# Patient Record
Sex: Male | Born: 1985
Health system: Southern US, Community
[De-identification: ages and names within clinical notes are randomized; demographics above are authoritative.]

## PROBLEM LIST (undated history)

## (undated) DIAGNOSIS — M5137 Other intervertebral disc degeneration, lumbosacral region: Secondary | ICD-10-CM

## (undated) DIAGNOSIS — M25569 Pain in unspecified knee: Secondary | ICD-10-CM

## (undated) DIAGNOSIS — M79606 Pain in leg, unspecified: Secondary | ICD-10-CM

## (undated) DIAGNOSIS — M7918 Myalgia, other site: Secondary | ICD-10-CM

## (undated) DIAGNOSIS — M51379 Other intervertebral disc degeneration, lumbosacral region without mention of lumbar back pain or lower extremity pain: Secondary | ICD-10-CM

## (undated) DIAGNOSIS — R7303 Prediabetes: Secondary | ICD-10-CM

## (undated) DIAGNOSIS — G4730 Sleep apnea, unspecified: Secondary | ICD-10-CM

## (undated) DIAGNOSIS — M25559 Pain in unspecified hip: Secondary | ICD-10-CM

## (undated) DIAGNOSIS — E785 Hyperlipidemia, unspecified: Secondary | ICD-10-CM

## (undated) DIAGNOSIS — M4300 Spondylolysis, site unspecified: Secondary | ICD-10-CM

## (undated) DIAGNOSIS — K219 Gastro-esophageal reflux disease without esophagitis: Secondary | ICD-10-CM

## (undated) DIAGNOSIS — M25579 Pain in unspecified ankle and joints of unspecified foot: Secondary | ICD-10-CM

## (undated) HISTORY — DX: Prediabetes: R73.03

## (undated) HISTORY — DX: Sleep apnea, unspecified: G47.30

## (undated) HISTORY — DX: Other intervertebral disc degeneration, lumbosacral region without mention of lumbar back pain or lower extremity pain: M51.379

## (undated) HISTORY — DX: Hyperlipidemia, unspecified: E78.5

## (undated) HISTORY — PX: TONSILLECTOMY: SUR1361

## (undated) HISTORY — DX: Gastro-esophageal reflux disease without esophagitis: K21.9

## (undated) HISTORY — DX: Other intervertebral disc degeneration, lumbosacral region: M51.37

---
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
## 2006-08-31 ENCOUNTER — Emergency Department (HOSPITAL_COMMUNITY): Admission: EM | Admit: 2006-08-31 | Discharge: 2006-08-31 | Payer: Self-pay | Admitting: Emergency Medicine

## 2010-12-16 LAB — URINALYSIS, ROUTINE W REFLEX MICROSCOPIC
Glucose, UA: NEGATIVE
Hgb urine dipstick: NEGATIVE
pH: 7

## 2013-12-29 ENCOUNTER — Encounter: Payer: Self-pay | Admitting: Neurology

## 2013-12-29 ENCOUNTER — Ambulatory Visit (INDEPENDENT_AMBULATORY_CARE_PROVIDER_SITE_OTHER): Payer: BC Managed Care – PPO | Admitting: Neurology

## 2013-12-29 VITALS — BP 182/28 | HR 52 | Temp 98.2°F | Ht 68.0 in | Wt 187.0 lb

## 2013-12-29 DIAGNOSIS — G4733 Obstructive sleep apnea (adult) (pediatric): Secondary | ICD-10-CM

## 2013-12-29 DIAGNOSIS — G4761 Periodic limb movement disorder: Secondary | ICD-10-CM

## 2013-12-29 DIAGNOSIS — K219 Gastro-esophageal reflux disease without esophagitis: Secondary | ICD-10-CM

## 2013-12-29 DIAGNOSIS — G4719 Other hypersomnia: Secondary | ICD-10-CM

## 2013-12-29 NOTE — Patient Instructions (Signed)

## 2013-12-29 NOTE — Progress Notes (Signed)
Subjective:    Patient ID: Rick Maldonado is a 28 y.o. male.  HPI    Rick Foley, MD, PhD [facility] [address] [address] [place], [place] [address]  Dear Rick Maldonado,   I saw your patient, Rick Maldonado, upon your kind request in my neurologic clinic today for initial consultation of his sleep disturbance, in particular, concern for underlying obstructive sleep apnea. The patient is unaccompanied today. As you know, Rick Maldonado is a 28 year old right-handed man with an underlying medical history of obesity, and reflux disease, who reports snoring, daytime somnolence and nonrestorative sleep. He has been told that he has pauses in his breathing while asleep.  His typical bedtime is reported to be around 9 to 10 PM and usual wake time is around 6:30 AM. Sleep onset typically occurs within minutes. He reports feeling poorly rested upon awakening. He wakes up on an average 3 times in the middle of the night and has to go to the bathroom 1 times on a typical night. He denies morning headaches.  He reports excessive daytime somnolence (EDS) and His Epworth Sleepiness Score (ESS) is 14/24 today. He has been snoring for years. Snoring is loud. It is associated with witnessed pauses in his breathing per wife. He does wake up multiple times in the middle of the night. He reports a family history of sleep apnea and his father who has a CPAP machine.  He does not report any parasomnia but used to sleep talk as a child. He does endorse a need to move his legs before falling asleep and feeling uncomfortable in his legs and also cold feet before he falls asleep. His leg movements disturb his wife. He does not smoke. He drinks alcohol about once a month. He drinks caffeine 1-2 sodas per day. He does not drink enough water he admits. He has a TV in the bedroom but does not typically watch it. However, he likes to play on his cell phone and repeat in his bed. They have 2 dogs  which are not in his bedroom. His wife likes to sleep with a table fan. He has occasional nasal congestion and postnasal drip. He has occasional nighttime reflux issues and has just recently restarted taking Nexium.   His Past Medical History Is Significant For: Past Medical History  Diagnosis Date  . Acid reflux     His Past Surgical History Is Significant For: Past Surgical History  Procedure Laterality Date  . Tonsillectomy      as a child    His Family History Is Significant For: Family History  Problem Relation Age of Onset  . Other Mother     drug addiction  . Arthritis Father     His Social History Is Significant For: History   Social History  . Marital Status: Single    Spouse Name: [name]    Number of Children: 0  . Years of Education: BS   Occupational History  .      [employer]   Social History Main Topics  . Smoking status: Never Smoker   . Smokeless tobacco: Never Used  . Alcohol Use: Yes     Comment: rarely  . Drug Use: No  . Sexual Activity: None   Other Topics Concern  . None   Social History Narrative   Patient consumes 2 glasses of caffeine daily,is right handed    His Allergies Are:  Allergies  Allergen Reactions  . Codeine  Reaction unknown  . Morphine And Related     Throat closing  . Penicillins     Reaction unknown  . Sulfa Antibiotics     Reaction unknown  :   His Current Medications Are:  Outpatient Encounter Prescriptions as of 12/29/2013  Medication Sig  . esomeprazole (NEXIUM) 40 MG capsule Take 40 mg by mouth daily at 12 noon.  :  Review of Systems:  Out of a complete 14 point review of systems, all are reviewed and negative with the exception of these symptoms as listed below:   Review of Systems  Neurological:       Snoring, sleepines, restless legs    Objective:  Neurologic Exam  Physical Exam Physical Examination:   Filed Vitals:   12/29/13 0811  BP: 182/28  Pulse: 52  Temp: 98.2 F (36.8 C)     General Examination: The patient is a very pleasant 28 y.o. male in no acute distress. He appears well-developed and well-nourished and well groomed.   HEENT: Normocephalic, atraumatic, pupils are equal, round and reactive to light and accommodation. Funduscopic exam is normal with sharp disc margins noted. Extraocular tracking is good without limitation to gaze excursion or nystagmus noted. Normal smooth pursuit is noted. Hearing is grossly intact. Tympanic membranes are clear bilaterally. Face is symmetric with normal facial animation and normal facial sensation. Speech is clear with no dysarthria noted. There is no hypophonia. There is no lip, neck/head, jaw or voice tremor. Neck is supple with full range of passive and active motion. There are no carotid bruits on auscultation. Oropharynx exam reveals: mild mouth dryness, good dental hygiene and moderate airway crowding, due to narrow airway entry and elongated tongue and redundant soft palate and uvula - could not visualize tip of uvula. Mallampati is class II. Tongue protrudes centrally and palate elevates symmetrically. Tonsils are absent. Neck size is 16.5 inches. He has a Mild overbite. Nasal inspection reveals no significant nasal mucosal bogginess or redness and no septal deviation.   Chest: Clear to auscultation without wheezing, rhonchi or crackles noted.  Heart: S1+S2+0, regular and normal without murmurs, rubs or gallops noted.   Abdomen: Soft, non-tender and non-distended with normal bowel sounds appreciated on auscultation.  Extremities: There is no pitting edema in the distal lower extremities bilaterally. Pedal pulses are intact.  Skin: Warm and dry without trophic changes noted. There are no varicose veins.  Musculoskeletal: exam reveals no obvious joint deformities, tenderness or joint swelling or erythema.   Neurologically:  Mental status: The patient is awake, alert and oriented in all 4 spheres. His immediate and remote  memory, attention, language skills and fund of knowledge are appropriate. There is no evidence of aphasia, agnosia, apraxia or anomia. Speech is clear with normal prosody and enunciation. Thought process is linear. Mood is normal and affect is normal.  Cranial nerves II - XII are as described above under HEENT exam. In addition: shoulder shrug is normal with equal shoulder height noted. Motor exam: Normal bulk, strength and tone is noted. There is no drift, tremor or rebound. Romberg is negative. Reflexes are 2+ throughout. Babinski: Toes are flexor bilaterally. Fine motor skills and coordination: intact with normal finger taps, normal hand movements, normal rapid alternating patting, normal foot taps and normal foot agility.  Cerebellar testing: No dysmetria or intention tremor on finger to nose testing. Heel to shin is unremarkable bilaterally. There is no truncal or gait ataxia.  Sensory exam: intact to light touch, pinprick, vibration, temperature sense  in the upper and lower extremities.  Gait, station and balance: He stands easily. No veering to one side is noted. No leaning to one side is noted. Posture is age-appropriate and stance is narrow based. Gait shows normal stride length and normal pace. No problems turning are noted. He turns en bloc. Tandem walk is unremarkable. Intact toe and heel stance is noted.               Assessment and Plan:   In summary, Rick OmanStephen B Maldonado is a very pleasant 28 y.o.-year old male with a history of reflux disease, with a history and physical exam concerning for obstructive sleep apnea (OSA). I had a long chat with the patient about my findings and the diagnosis of OSA, its prognosis and treatment options. We talked about medical treatments, surgical interventions and non-pharmacological approaches. I explained in particular the risks and ramifications of untreated moderate to severe OSA, especially with respect to developing cardiovascular disease down the Road,  including congestive heart failure, difficult to treat hypertension, cardiac arrhythmias, or stroke. Even type 2 diabetes has, in part, been linked to untreated OSA. Symptoms of untreated OSA include daytime sleepiness, memory problems, mood irritability and mood disorder such as depression and anxiety, lack of energy, as well as recurrent headaches, especially morning headaches. We talked about trying to maintain a healthy lifestyle in general, as well as the importance of weight control. I encouraged the patient to eat healthy, exercise daily and keep well hydrated, to keep a scheduled bedtime and wake time routine, to not skip any meals and eat healthy snacks in between meals. I advised the patient not to drive when feeling sleepy. I recommended the following at this time: sleep study with potential positive airway pressure titration. (We will score hypopneas at 3% and split the sleep study into diagnostic and treatment portion, if the estimated. 2 hour AHI is >15/h).   I explained the sleep test procedure to the patient and also outlined possible surgical and non-surgical treatment options of OSA, including the use of a custom-made dental device (which would require a referral to a specialist dentist or oral surgeon), upper airway surgical options, such as pillar implants, radiofrequency surgery, tongue base surgery, and UPPP (which would involve a referral to an ENT surgeon). Rarely, jaw surgery such as mandibular advancement may be considered.  I also explained the CPAP treatment option to the patient, who indicated that hewould be willing to try CPAP if the need arises. I explained the importance of being compliant with PAP treatment, not only for insurance purposes but primarily to improve His symptoms, and for the patient's long term health benefit, including to reduce His cardiovascular risks. I answered all  hisquestions today and the patient was in agreement. I would like to see  himback after the  sleep study is completed and encouraged him to call with any interim questions, concerns, problems or updates.   Thank you very much for allowing me to participate in the care of this nice patient. If I can be of any further assistance to you please do not hesitate to call me at [phone].  Sincerely,   Rick FoleySaima Tavarious Freel, MD, PhD

## 2014-01-31 ENCOUNTER — Ambulatory Visit (INDEPENDENT_AMBULATORY_CARE_PROVIDER_SITE_OTHER): Payer: BC Managed Care – PPO | Admitting: Neurology

## 2014-01-31 VITALS — Ht 68.0 in | Wt 187.0 lb

## 2014-01-31 DIAGNOSIS — G4730 Sleep apnea, unspecified: Secondary | ICD-10-CM

## 2014-01-31 DIAGNOSIS — G4710 Hypersomnia, unspecified: Secondary | ICD-10-CM

## 2014-01-31 DIAGNOSIS — G4733 Obstructive sleep apnea (adult) (pediatric): Secondary | ICD-10-CM

## 2014-01-31 NOTE — Sleep Study (Signed)
Please see the scanned sleep study interpretation located in the Procedure tab within the Chart Review section. 

## 2014-02-09 ENCOUNTER — Telehealth: Payer: Self-pay | Admitting: Neurology

## 2014-02-09 DIAGNOSIS — G4733 Obstructive sleep apnea (adult) (pediatric): Secondary | ICD-10-CM

## 2014-02-09 NOTE — Telephone Encounter (Signed)
Please call and notify patient that the recent sleep study confirmed the diagnosis of OSA. He did well with CPAP during the study with significant improvement of the respiratory events. Therefore, I would like start the patient on CPAP at home. I placed the order in the chart.   Arrange for CPAP set up at home through a DME company of patient's choice and fax/route report to PCP and referring MD (if other than PCP).   The patient will also need a follow up appointment with me in 6-8 weeks post set up that has to be scheduled; help the patient schedule this (in a follow-up slot).   Please re-enforce the importance of compliance with treatment and the need for us to monitor compliance data.   Once you have spoken to the patient and scheduled the return appointment, you may close this encounter, thanks,   Brendan Gruwell, MD, PhD [facility] ([facility])    

## 2014-02-10 ENCOUNTER — Encounter: Payer: Self-pay | Admitting: Neurology

## 2014-02-12 ENCOUNTER — Encounter: Payer: Self-pay | Admitting: *Deleted

## 2014-02-12 NOTE — Telephone Encounter (Signed)
Patient was contacted and provided the results of his split night study in which severe OSA was observed.  Patient was referred to [facility] for CPAP set up.  The patient gave verbal permission to mail a copy of his test results.  The referring PA Vita ErmOlaf Massenberg was faxed a copy of the report.   Patient instructed to contact our office 6-8 weeks post set up to schedule a follow up appointment.

## 2014-03-19 ENCOUNTER — Encounter: Payer: Self-pay | Admitting: Neurology

## 2014-05-09 ENCOUNTER — Encounter: Payer: Self-pay | Admitting: Neurology

## 2014-05-10 ENCOUNTER — Encounter: Payer: Self-pay | Admitting: Neurology

## 2014-05-10 ENCOUNTER — Ambulatory Visit (INDEPENDENT_AMBULATORY_CARE_PROVIDER_SITE_OTHER): Payer: BLUE CROSS/BLUE SHIELD | Admitting: Neurology

## 2014-05-10 VITALS — BP 121/78 | HR 60 | Temp 97.2°F | Resp 18 | Ht 68.0 in | Wt 190.0 lb

## 2014-05-10 DIAGNOSIS — J3089 Other allergic rhinitis: Secondary | ICD-10-CM

## 2014-05-10 DIAGNOSIS — Z9989 Dependence on other enabling machines and devices: Secondary | ICD-10-CM

## 2014-05-10 DIAGNOSIS — G4733 Obstructive sleep apnea (adult) (pediatric): Secondary | ICD-10-CM

## 2014-05-10 DIAGNOSIS — K219 Gastro-esophageal reflux disease without esophagitis: Secondary | ICD-10-CM | POA: Diagnosis not present

## 2014-05-10 NOTE — Patient Instructions (Signed)

## 2014-05-10 NOTE — Progress Notes (Signed)
Subjective:    Patient ID: Rick Maldonado is a 29 y.o. male.  HPI     Interim history:    Rick Maldonado is a 29 year old right-handed man with an underlying medical history of obesity, and reflux disease, who presents for follow-up consultation of his obstructive sleep apnea, after his recent sleep study. The patient is unaccompanied today. I first met him on 12/29/2013 at the request of his primary care physician, at which time the patient reported snoring, nonrestorative sleep, witnessed apneas and excessive daytime somnolence. I invited him back for sleep study. He had a split-night sleep study and 01/31/2014 and went over his test results with him in detail today. His sleep efficiency at baseline was reduced at 62.5% with a latency to sleep of 15 minutes and wake after sleep onset of 18 minutes with moderate sleep fragmentation noted. He had an elevated arousal index. He had an increased percentage of stage II sleep and absence of REM sleep prior to CPAP initiation. He had no significant PLMS or EKG changes. He had moderate to loud snoring. He had a total AHI of 52.4 per hour based primarily on hypopneas. His average oxygen saturation was 93% with a nadir of 87%. He was titrated on CPAP during the second part of the study. He had an increased percentage of slow-wave sleep and 25% of REM sleep. He was titrated on CPAP from 5-10 cm with a residual AHI of less than 1 per hour on a pressure of 8 cm with REM sleep achieved. Based on the test results I prescribed a CPAP machine for home use.  I reviewed his compliance data from 02/13/2014 through 03/14/2014 which is a total of 30 days during which time he is machine 27 days with percent days greater than 4 hours of 77%, indicating adequate compliance with an average usage of 5 hours and 44 minutes and residual AHI low at 0.8 per hour and a pressure of 8 cm with EPR of 1. Leak acceptable with the 95th percentile at 17.9 L/m.   Today, I reviewed his  compliance data from 04/09/2014 through 05/08/2014 which is a total of 30 days during which time he used his machine 18 days only. He reports that he had nasal congestion and also a six-day business trip that came up ad hoc. Percent used days greater than 4 hours was to 50% with an average usage of 3 hours and 46 minutes and residual AHI low at 0.8 per hour and leak low with the 95th percentile at 6.6 L/m on a pressure of 8.   Today, he reports  that he still struggling with the mask. He actually tried all 3 different types of masks. A full facemask just leaks too much partly because he likes to change positions and the other reason is he has a full beard. He is now on a nasal mask but feels it may be too big. He's been sleeping better when he slept on his back but now likes to sleep on his sides as well. Interestingly, his nocturnal reflux has tremendously improved and he does not take Nexium on a day-to-day basis any longer. He actually feels improved and is daytime somnolence, sleep quality and the need for sleeping longer. He is able to wake up for work and does not feel groggy. He also does not have any significant daytime somnolence when he comes home from work and is able to do other things. He has had some nasal congestion and allergy symptoms as  well as postnasal drip. He has reduced his humidifier setting.   His typical bedtime is reported to be around 9 to 10 PM and usual wake time is around 6:30 AM. Sleep onset typically occurs within minutes. He reports feeling poorly rested upon awakening. He wakes up on an average 3 times in the middle of the night and has to go to the bathroom 1 times on a typical night. He denies morning headaches.   He reports excessive daytime somnolence (EDS) and His Epworth Sleepiness Score (ESS) is 14/24 today. He has been snoring for years. Snoring is loud. It is associated with witnessed pauses in his breathing per wife. He does wake up multiple times in the middle of  the night. He reports a family history of sleep apnea and his father who has a CPAP machine.   He does not report any parasomnia but used to sleep talk as a child. He does endorse a need to move his legs before falling asleep and feeling uncomfortable in his legs and also cold feet before he falls asleep. His leg movements disturb his wife. He does not smoke. He drinks alcohol about once a month. He drinks caffeine 1-2 sodas per day. He does not drink enough water he admits. He has a TV in the bedroom but does not typically watch it. However, he likes to play on his cell phone and repeat in his bed. They have 2 dogs which are not in his bedroom. His wife likes to sleep with a table fan. He has occasional nasal congestion and postnasal drip. He has occasional nighttime reflux issues and has just recently restarted taking Nexium.    His Past Medical History Is Significant For: Past Medical History  Diagnosis Date  . Acid reflux     His Past Surgical History Is Significant For: Past Surgical History  Procedure Laterality Date  . Tonsillectomy      as a child    His Family History Is Significant For: Family History  Problem Relation Age of Onset  . Other Mother     drug addiction  . Arthritis Father     His Social History Is Significant For: History   Social History  . Marital Status: Single    Spouse Name: Rick Maldonado  . Number of Children: 0  . Years of Education: BS   Occupational History  .      [employer]   Social History Main Topics  . Smoking status: Never Smoker   . Smokeless tobacco: Never Used  . Alcohol Use: Yes     Comment: rarely  . Drug Use: No  . Sexual Activity: Not on file   Other Topics Concern  . None   Social History Narrative   Patient consumes 2 glasses of caffeine daily,is right handed    His Allergies Are:  Allergies  Allergen Reactions  . Codeine     Reaction unknown  . Morphine And Related     Throat closing  . Penicillins     Reaction  unknown  . Sulfa Antibiotics     Reaction unknown  :   His Current Medications Are:  Outpatient Encounter Prescriptions as of 05/10/2014  Medication Sig  . esomeprazole (NEXIUM) 40 MG capsule Take 40 mg by mouth daily at 12 noon.  :  Review of Systems:  Out of a complete 14 point review of systems, all are reviewed and negative with the exception of these symptoms as listed below:   Review of Systems  All other systems reviewed and are negative.  Struggling with mask. Able to dream. Improved EDS.  Objective:  Neurologic Exam  Physical Exam Physical Examination:   Filed Vitals:   05/10/14 0801  BP: 121/78  Pulse: 60  Temp: 97.2 F (36.2 C)  Resp: 18    General Examination: The patient is a very pleasant 29 y.o. male in no acute distress. He appears well-developed and well-nourished and well groomed.   HEENT: Normocephalic, atraumatic, pupils are equal, round and reactive to light and accommodation. Funduscopic exam is normal with sharp disc margins noted. Extraocular tracking is good without limitation to gaze excursion or nystagmus noted. Normal smooth pursuit is noted. Hearing is grossly intact. Face is symmetric with normal facial animation and normal facial sensation. Speech is clear with no dysarthria noted. There is no hypophonia. There is no lip, neck/head, jaw or voice tremor. Neck is supple with full range of passive and active motion. There are no carotid bruits on auscultation. Oropharynx exam reveals: mild mouth dryness, good dental hygiene and moderate airway crowding, due to narrow airway entry and elongated tongue and redundant soft palate and uvula - could not visualize tip of uvula. Mallampati is class II. Tongue protrudes centrally and palate elevates symmetrically. Tonsils are absent. Neck size is 16.5 inches. He has a Mild overbite. Nasal inspection reveals no significant nasal mucosal bogginess or redness and no septal deviation.   Chest: Clear to auscultation  without wheezing, rhonchi or crackles noted.  Heart: S1+S2+0, regular and normal without murmurs, rubs or gallops noted.   Abdomen: Soft, non-tender and non-distended with normal bowel sounds appreciated on auscultation.  Extremities: There is no pitting edema in the distal lower extremities bilaterally. Pedal pulses are intact.  Skin: Warm and dry without trophic changes noted. There are no varicose veins.  Musculoskeletal: exam reveals no obvious joint deformities, tenderness or joint swelling or erythema.   Neurologically:  Mental status: The patient is awake, alert and oriented in all 4 spheres. His immediate and remote memory, attention, language skills and fund of knowledge are appropriate. There is no evidence of aphasia, agnosia, apraxia or anomia. Speech is clear with normal prosody and enunciation. Thought process is linear. Mood is normal and affect is normal.  Cranial nerves II - XII are as described above under HEENT exam. In addition: shoulder shrug is normal with equal shoulder height noted. Motor exam: Normal bulk, strength and tone is noted. There is no drift, tremor or rebound. Romberg is negative. Reflexes are 2+ throughout. Babinski: Toes are flexor bilaterally. Fine motor skills and coordination: intact with normal finger taps, normal hand movements, normal rapid alternating patting, normal foot taps and normal foot agility.  Cerebellar testing: No dysmetria or intention tremor on finger to nose testing. Heel to shin is unremarkable bilaterally. There is no truncal or gait ataxia.  Sensory exam: intact to light touch, pinprick, vibration, temperature sense in the upper and lower extremities.  Gait, station and balance: He stands easily. No veering to one side is noted. No leaning to one side is noted. Posture is age-appropriate and stance is narrow based. Gait shows normal stride length and normal pace. No problems turning are noted. He turns en bloc. Tandem walk is  unremarkable.               Assessment and Plan:   In summary, Rick Maldonado is a very pleasant 29 year old male with a history of reflux disease and allergic rhinitis, who presents for follow-up  consultation of his obstructive sleep apnea, now on treatment with CPAP at 8 cm of pressure. His physical exam is stable. He feels improved and his reflux disease. He has nasal allergy symptoms which are still bothersome to him and have prevented him from using his machine every night. He also took a business trip ad hoc for which she did not take his CPAP machine. Today I spent most of my 20 minute visit with him and counseling and coordination of care. I advised him about his sleep study results in detail from 01/31/2014 and went over his compliance data as well. He is suboptimally compliant at this time. He is motivated to improve his compliance. We have achieved improvement of his daytime somnolence, sleep quality, nocturnal reflux and his residual issues are mask fitting and nasal allergy symptoms. We will continue to monitor his air leak and he is probably best served with using a nasal mask at this time. For his nasal allergies he will use over-the-counter medications. I would like to see him back in 4 months from now, sooner if needed. I answered all his questions today and he was in agreement.

## 2014-06-22 ENCOUNTER — Encounter: Payer: Self-pay | Admitting: Neurology

## 2014-09-13 ENCOUNTER — Ambulatory Visit: Payer: BLUE CROSS/BLUE SHIELD | Admitting: Neurology

## 2014-09-26 ENCOUNTER — Ambulatory Visit (INDEPENDENT_AMBULATORY_CARE_PROVIDER_SITE_OTHER): Payer: 59 | Admitting: Neurology

## 2014-09-26 ENCOUNTER — Encounter: Payer: Self-pay | Admitting: Neurology

## 2014-09-26 VITALS — BP 123/75 | HR 60 | Resp 16 | Ht 68.0 in | Wt 193.0 lb

## 2014-09-26 DIAGNOSIS — G4733 Obstructive sleep apnea (adult) (pediatric): Secondary | ICD-10-CM

## 2014-09-26 DIAGNOSIS — Z9989 Dependence on other enabling machines and devices: Secondary | ICD-10-CM

## 2014-09-26 NOTE — Patient Instructions (Signed)
Please continue using your CPAP regularly. While your insurance requires that you use CPAP at least 4 hours each night on 70% of the nights, I recommend, that you not skip any nights and use it throughout the night if you can. Getting used to CPAP and staying with the treatment long term does take time and patience and discipline. Untreated obstructive sleep apnea when it is moderate to severe can have an adverse impact on cardiovascular health and raise her risk for heart disease, arrhythmias, hypertension, congestive heart failure, stroke and diabetes. Untreated obstructive sleep apnea causes sleep disruption, nonrestorative sleep, and sleep deprivation. This can have an impact on your day to day functioning and cause daytime sleepiness and impairment of cognitive function, memory loss, mood disturbance, and problems focussing. Using CPAP regularly can improve these symptoms.  Follow up in 6 months.  

## 2014-09-26 NOTE — Progress Notes (Signed)
Subjective:    Patient ID: Rick Maldonado is a 29 y.o. male.  HPI     Interim history:   Rick Maldonado is a 29 year old right-handed man with an underlying medical history of obesity, and reflux disease, who presents for follow-up consultation of his obstructive sleep apnea, after his recent sleep study. The patient is unaccompanied today. I last saw him on 05/10/2014, at which time he reported that he was still struggling with the mask. He had tried different masks. He felt that his nocturnal reflux symptoms had improved with CPAP therapy and he was no longer using Nexium on a day-to-day basis. He did endorse improvement of his daytime somnolence, sleep quality and sleep consolidation. His compliance was about 50% at the time. He was encouraged to use CPAP regularly and not skip any nights. We talked about his sleep test results and compliance data in detail.   Today, 09/26/2014: I reviewed his CPAP compliance data from 08/14/2014 through 09/12/2014 which is a total of 30 days during which time he used his machine only 22 days with percent used days greater than 4 hours at 60% only, indicating better, but still suboptimal compliance with an average usage of 4 hours and 24 minutes, residual AHI at 0.8 per hour, leaked low with the 95th percentile at 15 L/m on a pressure of 8 cm with EPR of 2.  Today, 09/26/2014: He reports overall doing better. He was able to switch to a different nasal mask, which is more comfortable. He sometimes wakes up with a sense of chest tightness and inability to breathe deeply. This is transient and goes away and does not happen every morning. Sometimes he falls asleep on the couch without putting his CPAP on and then wakes up in the middle of the night to go to bed. His reflux is stable and has actually improved after she started CPAP therapy. He has no new complaints. He has changed jobs which is less stressful and requires less travel.  Previously:  I first met him on  12/29/2013 at the request of his primary care physician, at which time the patient reported snoring, nonrestorative sleep, witnessed apneas and excessive daytime somnolence. I invited him back for sleep study. He had a split-night sleep study on 01/31/2014 and went over his test results with him in detail today. His sleep efficiency at baseline was reduced at 62.5% with a latency to sleep of 15 minutes and wake after sleep onset of 18 minutes with moderate sleep fragmentation noted. He had an elevated arousal index. He had an increased percentage of stage II sleep and absence of REM sleep prior to CPAP initiation. He had no significant PLMS or EKG changes. He had moderate to loud snoring. He had a total AHI of 52.4 per hour based primarily on hypopneas. His average oxygen saturation was 93% with a nadir of 87%. He was titrated on CPAP during the second part of the study. He had an increased percentage of slow-wave sleep and 25% of REM sleep. He was titrated on CPAP from 5-10 cm with a residual AHI of less than 1 per hour on a pressure of 8 cm with REM sleep achieved. Based on the test results I prescribed a CPAP machine for home use.   I reviewed his compliance data from 02/13/2014 through 03/14/2014 which is a total of 30 days during which time he is machine 27 days with percent days greater than 4 hours of 77%, indicating adequate compliance with an average usage  of 5 hours and 44 minutes and residual AHI low at 0.8 per hour and a pressure of 8 cm with EPR of 1. Leak acceptable with the 95th percentile at 17.9 L/m.   I reviewed his compliance data from 04/09/2014 through 05/08/2014 which is a total of 30 days during which time he used his machine 18 days only. He reports that he had nasal congestion and also a six-day business trip that came up ad hoc. Percent used days greater than 4 hours was to 50% with an average usage of 3 hours and 46 minutes and residual AHI low at 0.8 per hour and leak low with the 95th  percentile at 6.6 L/m on a pressure of 8.   His typical bedtime is reported to be around 9 to 10 PM and usual wake time is around 6:30 AM. Sleep onset typically occurs within minutes. He reports feeling poorly rested upon awakening. He wakes up on an average 3 times in the middle of the night and has to go to the bathroom 1 times on a typical night. He denies morning headaches.   He reports excessive daytime somnolence (EDS) and His Epworth Sleepiness Score (ESS) is 14/24 today. He has been snoring for years. Snoring is loud. It is associated with witnessed pauses in his breathing per wife. He does wake up multiple times in the middle of the night. He reports a family history of sleep apnea and his father who has a CPAP machine.   He does not report any parasomnia but used to sleep talk as a child. He does endorse a need to move his legs before falling asleep and feeling uncomfortable in his legs and also cold feet before he falls asleep. His leg movements disturb his wife. He does not smoke. He drinks alcohol about once a month. He drinks caffeine 1-2 sodas per day. He does not drink enough water he admits. He has a TV in the bedroom but does not typically watch it. However, he likes to play on his cell phone and repeat in his bed. They have 2 dogs which are not in his bedroom. His wife likes to sleep with a table fan. He has occasional nasal congestion and postnasal drip. He has occasional nighttime reflux issues and has just recently restarted taking Nexium.   His Past Medical History Is Significant For: Past Medical History  Diagnosis Date  . Acid reflux     His Past Surgical History Is Significant For: Past Surgical History  Procedure Laterality Date  . Tonsillectomy      as a child    His Family History Is Significant For: Family History  Problem Relation Age of Onset  . Other Mother     drug addiction  . Arthritis Father     His Social History Is Significant For: History   Social  History  . Marital Status: Single    Spouse Name: Belenda Cruise  . Number of Children: 0  . Years of Education: BS   Occupational History  .      [employer]   Social History Main Topics  . Smoking status: Never Smoker   . Smokeless tobacco: Never Used  . Alcohol Use: Yes     Comment: rarely  . Drug Use: No  . Sexual Activity: Not on file   Other Topics Concern  . None   Social History Narrative   Patient consumes 2 glasses of caffeine daily,is right handed    His Allergies Are:  Allergies  Allergen Reactions  . Codeine     Reaction unknown  . Morphine And Related     Throat closing  . Penicillins     Reaction unknown  . Sulfa Antibiotics     Reaction unknown  :   His Current Medications Are:  Outpatient Encounter Prescriptions as of 09/26/2014  Medication Sig  . esomeprazole (NEXIUM) 40 MG capsule Take 40 mg by mouth daily at 12 noon.   No facility-administered encounter medications on file as of 09/26/2014.  :  Review of Systems:  Out of a complete 14 point review of systems, all are reviewed and negative with the exception of these symptoms as listed below:   Review of Systems  Neurological:       Patient states that sometimes a night while wearing CPAP, he has chest discomfort and feels that it is hard to take a deep breath.     Objective:  Neurologic Exam  Physical Exam Physical Examination:   Filed Vitals:   09/26/14 1526  BP: 123/75  Pulse: 60  Resp: 16    General Examination: The patient is a very pleasant 29 y.o. male in no acute distress. He appears well-developed and well-nourished and well groomed.   HEENT: Normocephalic, atraumatic, pupils are equal, round and reactive to light and accommodation. Funduscopic exam is normal with sharp disc margins noted. Extraocular tracking is good without limitation to gaze excursion or nystagmus noted. Normal smooth pursuit is noted. Hearing is grossly intact. Face is symmetric with normal facial animation  and normal facial sensation. Speech is clear with no dysarthria noted. There is no hypophonia. There is no lip, neck/head, jaw or voice tremor. Neck is supple with full range of passive and active motion. There are no carotid bruits on auscultation. Oropharynx exam reveals: mild mouth dryness, good dental hygiene and moderate airway crowding, due to narrow airway entry and elongated tongue and redundant soft palate and uvula - could not visualize tip of uvula. Mallampati is class II. Tongue protrudes centrally and palate elevates symmetrically. Tonsils are absent. He has a Mild overbite. Nasal inspection reveals no significant nasal mucosal bogginess or redness and no septal deviation.   Chest: Clear to auscultation without wheezing, rhonchi or crackles noted.  Heart: S1+S2+0, regular and normal without murmurs, rubs or gallops noted.   Abdomen: Soft, non-tender and non-distended with normal bowel sounds appreciated on auscultation.  Extremities: There is no pitting edema in the distal lower extremities bilaterally. Pedal pulses are intact.  Skin: Warm and dry without trophic changes noted. There are no varicose veins.  Musculoskeletal: exam reveals no obvious joint deformities, tenderness or joint swelling or erythema.   Neurologically:  Mental status: The patient is awake, alert and oriented in all 4 spheres. His immediate and remote memory, attention, language skills and fund of knowledge are appropriate. There is no evidence of aphasia, agnosia, apraxia or anomia. Speech is clear with normal prosody and enunciation. Thought process is linear. Mood is normal and affect is normal.  Cranial nerves II - XII are as described above under HEENT exam. In addition: shoulder shrug is normal with equal shoulder height noted. Motor exam: Normal bulk, strength and tone is noted. There is no drift, tremor or rebound. Romberg is negative. Reflexes are 2+ throughout. Fine motor skills and coordination: intact  with normal finger taps, normal hand movements, normal rapid alternating patting, normal foot taps and normal foot agility.  Cerebellar testing: No dysmetria or intention tremor on finger to nose testing. Heel  to shin is unremarkable bilaterally. There is no truncal or gait ataxia.  Sensory exam: intact to light touch in the upper and lower extremities.  Gait, station and balance: He stands easily. No veering to one side is noted. No leaning to one side is noted. Posture is age-appropriate and stance is narrow based. Gait shows normal stride length and normal pace. No problems turning are noted. He turns en bloc. Tandem walk is unremarkable.               Assessment and Plan:   In summary, Rick Maldonado is a very pleasant 29 year old male with a history of reflux disease and allergic rhinitis, who presents for follow-up consultation of his obstructive sleep apnea, on treatment with CPAP at 8 cm of pressure via a nasal mask. His physical exam is stable. He feels improved with respect to sleep quality and his reflux disease improved after treatment. He was able to get a new nasal mask which fits him better. He has improved his compliance but is still not fully compliant because of skip nights and falling asleep without CPAP on on other nights. He is encouraged to continue to try to be fully compliant with treatment. He is again advised about his sleep study results from 01/31/2014 and I went over his compliance data as well. He is motivated to improve his compliance. I commended him for trying. He has in the interim gained a few pounds. He is encouraged to try to lose weight. I will see him back in 6 months, sooner if needed. I answered all his questions today and he was in agreement.  I spent 15 minutes in total face-to-face time with the patient, more than 50% of which was spent in counseling and coordination of care, reviewing test results, reviewing medication and discussing or reviewing the diagnosis  of OSA, its prognosis and treatment options.

## 2015-04-02 ENCOUNTER — Encounter: Payer: Self-pay | Admitting: Neurology

## 2015-04-02 ENCOUNTER — Ambulatory Visit (INDEPENDENT_AMBULATORY_CARE_PROVIDER_SITE_OTHER): Payer: 59 | Admitting: Neurology

## 2015-04-02 VITALS — BP 129/82 | HR 58 | Resp 16 | Ht 68.0 in | Wt 191.0 lb

## 2015-04-02 DIAGNOSIS — Z9989 Dependence on other enabling machines and devices: Secondary | ICD-10-CM

## 2015-04-02 DIAGNOSIS — G4733 Obstructive sleep apnea (adult) (pediatric): Secondary | ICD-10-CM

## 2015-04-02 NOTE — Progress Notes (Signed)
Subjective:    Patient ID: Rick Maldonado is a 30 y.o. male.  HPI     Interim history:  Mr. Skelley is a 30 year old right-handed man with an underlying medical history of obesity, and reflux disease, who presents for follow-up consultation of his obstructive sleep apnea, on treatment with CPAP. The patient is unaccompanied today. I last saw him on 09/26/2014, at which time he reported doing better. He had switched to a different nasal mask. He felt that his reflux had improved after he started CPAP therapy. He had also a change in his job in the interim and this was less stressful and required less traveling.  Today, 04/02/2015: I reviewed his CPAP compliance data from 03/02/2015 through 03/31/2015 which is a total of 30 days during which time he used his machine 20 days with percent used days greater than 4 hours at 63%, indicating suboptimal compliance with an average usage of 4 hours and 45 minutes, residual AHI 0.8 per hour, leak low with the 95th percentile at 5.4 L/m on a pressure of 8 cm with EPR of 3.  Today, 04/02/2015: He reports that he was sick a few weeks ago and could not use his CPAP at the time. He does feel that there is a lot of air leak from the mask at times. He has mild RLS symptoms and some leg twitching before going to sleep. He still feels, CPAP has been helpful, sometimes air leaking from the mask blows into his eyes. He has ordered a cover for the nasal mask. He has finally found a mask he can tolerate. Work-wise, he is doing well. He may have the opportunity to go to [place] for about 3 months.  Previously:  05/10/2014, at which time he reported that he was still struggling with the mask. He had tried different masks. He felt that his nocturnal reflux symptoms had improved with CPAP therapy and he was no longer using Nexium on a day-to-day basis. He did endorse improvement of his daytime somnolence, sleep quality and sleep consolidation. His compliance was about 50%  at the time. He was encouraged to use CPAP regularly and not skip any nights. We talked about his sleep test results and compliance data in detail.   I reviewed his CPAP compliance data from 08/14/2014 through 09/12/2014 which is a total of 30 days during which time he used his machine only 22 days with percent used days greater than 4 hours at 60% only, indicating better, but still suboptimal compliance with an average usage of 4 hours and 24 minutes, residual AHI at 0.8 per hour, leaked low with the 95th percentile at 15 L/m on a pressure of 8 cm with EPR of 2.  I first met him on 12/29/2013 at the request of his primary care physician, at which time the patient reported snoring, nonrestorative sleep, witnessed apneas and excessive daytime somnolence. I invited him back for sleep study. He had a split-night sleep study on 01/31/2014 and went over his test results with him in detail today. His sleep efficiency at baseline was reduced at 62.5% with a latency to sleep of 15 minutes and wake after sleep onset of 18 minutes with moderate sleep fragmentation noted. He had an elevated arousal index. He had an increased percentage of stage II sleep and absence of REM sleep prior to CPAP initiation. He had no significant PLMS or EKG changes. He had moderate to loud snoring. He had a total AHI of 52.4 per hour based primarily on  hypopneas. His average oxygen saturation was 93% with a nadir of 87%. He was titrated on CPAP during the second part of the study. He had an increased percentage of slow-wave sleep and 25% of REM sleep. He was titrated on CPAP from 5-10 cm with a residual AHI of less than 1 per hour on a pressure of 8 cm with REM sleep achieved. Based on the test results I prescribed a CPAP machine for home use.   I reviewed his compliance data from 02/13/2014 through 03/14/2014 which is a total of 30 days during which time he is machine 27 days with percent days greater than 4 hours of 77%, indicating adequate  compliance with an average usage of 5 hours and 44 minutes and residual AHI low at 0.8 per hour and a pressure of 8 cm with EPR of 1. Leak acceptable with the 95th percentile at 17.9 L/m.   I reviewed his compliance data from 04/09/2014 through 05/08/2014 which is a total of 30 days during which time he used his machine 18 days only. He reports that he had nasal congestion and also a six-day business trip that came up ad hoc. Percent used days greater than 4 hours was to 50% with an average usage of 3 hours and 46 minutes and residual AHI low at 0.8 per hour and leak low with the 95th percentile at 6.6 L/m on a pressure of 8.   His typical bedtime is reported to be around 9 to 10 PM and usual wake time is around 6:30 AM. Sleep onset typically occurs within minutes. He reports feeling poorly rested upon awakening. He wakes up on an average 3 times in the middle of the night and has to go to the bathroom 1 times on a typical night. He denies morning headaches.   He reports excessive daytime somnolence (EDS) and His Epworth Sleepiness Score (ESS) is 14/24 today. He has been snoring for years. Snoring is loud. It is associated with witnessed pauses in his breathing per wife. He does wake up multiple times in the middle of the night. He reports a family history of sleep apnea and his father who has a CPAP machine.   He does not report any parasomnia but used to sleep talk as a child. He does endorse a need to move his legs before falling asleep and feeling uncomfortable in his legs and also cold feet before he falls asleep. His leg movements disturb his wife. He does not smoke. He drinks alcohol about once a month. He drinks caffeine 1-2 sodas per day. He does not drink enough water he admits. He has a TV in the bedroom but does not typically watch it. However, he likes to play on his cell phone and repeat in his bed. They have 2 dogs which are not in his bedroom. His wife likes to sleep with a table fan. He has  occasional nasal congestion and postnasal drip. He has occasional nighttime reflux issues and has just recently restarted taking Nexium.     His Past Medical History Is Significant For: Past Medical History  Diagnosis Date  . Acid reflux     His Past Surgical History Is Significant For: Past Surgical History  Procedure Laterality Date  . Tonsillectomy      as a child    His Family History Is Significant For: Family History  Problem Relation Age of Onset  . Other Mother     drug addiction  . Arthritis Father  His Social History Is Significant For: Social History   Social History  . Marital Status: Single    Spouse Name: [name]  . Number of Children: 0  . Years of Education: BS   Occupational History  .      [employer]   Social History Main Topics  . Smoking status: Never Smoker   . Smokeless tobacco: Never Used  . Alcohol Use: Yes     Comment: rarely  . Drug Use: No  . Sexual Activity: Not Asked   Other Topics Concern  . None   Social History Narrative   Patient consumes 2 glasses of caffeine daily,is right handed    His Allergies Are:  Allergies  Allergen Reactions  . Codeine     Reaction unknown  . Morphine And Related     Throat closing  . Penicillins     Reaction unknown  . Sulfa Antibiotics     Reaction unknown  :   His Current Medications Are:  Outpatient Encounter Prescriptions as of 04/02/2015  Medication Sig  . esomeprazole (NEXIUM) 40 MG capsule Take 40 mg by mouth daily at 12 noon.   No facility-administered encounter medications on file as of 04/02/2015.  :  Review of Systems:  Out of a complete 14 point review of systems, all are reviewed and negative with the exception of these symptoms as listed below:   Review of Systems  Neurological:       Patient feels like he has a lot of leaking from CPAP mask. Reports that he has been sick for a couple of weeks and was unable to use CPAP at that time.     Objective:  Neurologic  Exam  Physical Exam Physical Examination:   Filed Vitals:   04/02/15 1553  BP: 129/82  Pulse: 58  Resp: 16    General Examination: The patient is a very pleasant 30 y.o. male in no acute distress. He appears well-developed and well-nourished and well groomed.   HEENT: Normocephalic, atraumatic, pupils are equal, round and reactive to light and accommodation. Extraocular tracking is good without limitation to gaze excursion or nystagmus noted. Normal smooth pursuit is noted. Hearing is grossly intact. Face is symmetric with normal facial animation and normal facial sensation. Speech is clear with no dysarthria noted. There is no hypophonia. There is no lip, neck/head, jaw or voice tremor. Neck is supple with full range of passive and active motion. There are no carotid bruits on auscultation. Oropharynx exam reveals: mild mouth dryness, good dental hygiene and moderate airway crowding, due to narrow airway entry and elongated tongue and redundant soft palate and uvula. Mallampati is class II. Tongue protrudes centrally and palate elevates symmetrically. Tonsils are absent. He has a Mild overbite. Nasal inspection reveals no significant nasal mucosal bogginess or redness and no septal deviation.   Chest: Clear to auscultation without wheezing, rhonchi or crackles noted.  Heart: S1+S2+0, regular and normal without murmurs, rubs or gallops noted.   Abdomen: Soft, non-tender and non-distended with normal bowel sounds appreciated on auscultation.  Extremities: There is no pitting edema in the distal lower extremities bilaterally. Pedal pulses are intact.  Skin: Warm and dry without trophic changes noted. There are no varicose veins.  Musculoskeletal: exam reveals no obvious joint deformities, tenderness or joint swelling or erythema.   Neurologically:  Mental status: The patient is awake, alert and oriented in all 4 spheres. His immediate and remote memory, attention, language skills and fund  of knowledge are appropriate.  There is no evidence of aphasia, agnosia, apraxia or anomia. Speech is clear with normal prosody and enunciation. Thought process is linear. Mood is normal and affect is normal.  Cranial nerves II - XII are as described above under HEENT exam. In addition: shoulder shrug is normal with equal shoulder height noted. Motor exam: Normal bulk, strength and tone is noted. There is no drift, tremor or rebound. Romberg is negative. Reflexes are 2+ throughout. Fine motor skills and coordination: intact.  Cerebellar testing: No dysmetria or intention tremor on finger to nose testing. Heel to shin is unremarkable bilaterally. There is no truncal or gait ataxia.  Sensory exam: intact to light touch in the upper and lower extremities.  Gait, station and balance: He stands easily. No veering to one side is noted. No leaning to one side is noted. Posture is age-appropriate and stance is narrow based. Gait shows normal stride length and normal pace. No problems turning are noted. He turns en bloc. Tandem walk is unremarkable.               Assessment and Plan:   In summary, RADEK CARNERO is a very pleasant 30 year old male with a history of reflux disease and allergic rhinitis, who presents for follow-up consultation of his obstructive sleep apnea, on treatment with CPAP at 8 cm of pressure via a nasal mask. He finally found a mask he likes, [employer] nasal, after trying 2 other nasal mask, nasal pillows and even a FFM. His physical exam is stable. He feels improved with respect to sleep quality and his reflux symptoms also improved after he started CPAP treatment. He is reassured that the air leak from the mask is actually minimal. He was able to get a new nasal mask which fits him better. He has been adequate with his compliance, ranging from the mid 60s to 70s with his compliance percentage. He is encouraged to try to continue using CPAP regularly. He is advised to continue to work on  his weight loss. At this juncture, I would like to see him back on a yearly basis, sooner if needed. I answered all his questions today and he was in agreement.  I spent 15 minutes in total face-to-face time with the patient, more than 50% of which was spent in counseling and coordination of care, reviewing test results, reviewing medication and discussing or reviewing the diagnosis of OSA, its prognosis and treatment options.

## 2015-04-02 NOTE — Patient Instructions (Signed)

## 2016-01-03 ENCOUNTER — Ambulatory Visit (INDEPENDENT_AMBULATORY_CARE_PROVIDER_SITE_OTHER): Payer: 59 | Admitting: Family Medicine

## 2016-01-03 ENCOUNTER — Encounter: Payer: Self-pay | Admitting: Family Medicine

## 2016-01-03 VITALS — BP 108/80 | HR 82 | Temp 98.8°F | Resp 18 | Ht 67.0 in | Wt 193.0 lb

## 2016-01-03 DIAGNOSIS — S0500XA Injury of conjunctiva and corneal abrasion without foreign body, unspecified eye, initial encounter: Secondary | ICD-10-CM | POA: Diagnosis not present

## 2016-01-03 DIAGNOSIS — G4733 Obstructive sleep apnea (adult) (pediatric): Secondary | ICD-10-CM

## 2016-01-03 DIAGNOSIS — Z0000 Encounter for general adult medical examination without abnormal findings: Secondary | ICD-10-CM

## 2016-01-03 DIAGNOSIS — Z7689 Persons encountering health services in other specified circumstances: Secondary | ICD-10-CM

## 2016-01-03 MED ORDER — ERYTHROMYCIN 5 MG/GM OP OINT: 1.0000 "application " | TOPICAL_OINTMENT | Freq: Two times a day (BID) | OPHTHALMIC | 0 refills | Status: DC

## 2016-01-03 NOTE — Progress Notes (Signed)
Subjective:    Patient ID: Rick Maldonado, male    DOB: 10/14/1985, 30 y.o.   MRN: [id number]  HPI Patient is a very pleasant 30 year old white male here today to establish care. He reports a foreign body sensation in his left eye for about 5 days. There is some mild erythema and injection in his conjunctiva. Fluorescein exam is performed and the patient does have a small corneal abrasion at approximately 3:00 related to his pupil at the limbic border. Otherwise his eye exam is normal. Past medical history is significant for obstructive sleep apnea and heartburn. Patient states that he is not as compliant with his CPAP due to the fact the equipment is very uncomfortable. He would like a second opinion with an ENT doctor to see if he could benefit from surgery. On examination he does have a right-sided septal deviation. He also has prominent peritonsillar folds possibly obscuring his airway. He declines a flu shot today. Past Medical History:  Diagnosis Date  . Acid reflux   . Sleep apnea     Past Surgical History:  Procedure Laterality Date  . TONSILLECTOMY     as a child   No current outpatient prescriptions on file prior to visit.   No current facility-administered medications on file prior to visit.    Allergies  Allergen Reactions  . Codeine     Reaction unknown  . Morphine And Related     Throat closing  . Penicillins     Reaction unknown  . Sulfa Antibiotics     Reaction unknown   Social History   Social History  . Marital status: Single    Spouse name: Natalia LeatherwoodKatherine  . Number of children: 0  . Years of education: BS   Occupational History  .      [employer]   Social History Main Topics  . Smoking status: Never Smoker  . Smokeless tobacco: Never Used  . Alcohol use Yes     Comment: rarely  . Drug use: No  . Sexual activity: Not on file   Other Topics Concern  . Not on file   Social History Narrative   Patient consumes 2 glasses of caffeine daily,is right  handed   Family History  Problem Relation Age of Onset  . Other Mother     drug addiction  . Arthritis Father    Father also has sleep apnea. Maternal grandmother has diabetes  Review of Systems  All other systems reviewed and are negative.      Objective:   Physical Exam  Constitutional: He is oriented to person, place, and time. He appears well-developed and well-nourished. No distress.  HENT:  Head: Normocephalic and atraumatic.  Right Ear: External ear normal.  Left Ear: External ear normal.  Nose: Nose normal.  Mouth/Throat: Oropharynx is clear and moist. No oropharyngeal exudate.  Eyes: EOM are normal. Pupils are equal, round, and reactive to light. Right eye exhibits no discharge. Left eye exhibits no discharge. No scleral icterus.  Neck: Normal range of motion. Neck supple. No JVD present. No tracheal deviation present. No thyromegaly present.  Cardiovascular: Normal rate, regular rhythm, normal heart sounds and intact distal pulses.  Exam reveals no gallop and no friction rub.   No murmur heard. Pulmonary/Chest: Effort normal and breath sounds normal. No stridor. No respiratory distress. He has no wheezes. He has no rales. He exhibits no tenderness.  Abdominal: Soft. Bowel sounds are normal. He exhibits no distension and no mass. There is  no tenderness. There is no rebound and no guarding. Hernia confirmed negative in the right inguinal area and confirmed negative in the left inguinal area.  Genitourinary: Testes normal and penis normal.  Musculoskeletal: Normal range of motion. He exhibits no edema, tenderness or deformity.  Lymphadenopathy:    He has no cervical adenopathy.       Right: No inguinal adenopathy present.       Left: No inguinal adenopathy present.  Neurological: He is alert and oriented to person, place, and time. He has normal reflexes. He displays normal reflexes. No cranial nerve deficit. He exhibits normal muscle tone. Coordination normal.  Skin: Skin  is warm. No rash noted. He is not diaphoretic. No erythema. No pallor.  Psychiatric: He has a normal mood and affect. His behavior is normal. Judgment and thought content normal.  Vitals reviewed.         Assessment & Plan:  Encounter to establish care with new doctor  Routine general medical examination at a health care facility - Plan: CBC with Differential/Platelet, COMPLETE METABOLIC PANEL WITH GFR, Lipid panel  Corneal abrasion, unspecified laterality, initial encounter - Plan: erythromycin (ROMYCIN) ophthalmic ointment  Obstructive sleep apnea - Plan: Ambulatory referral to ENT  Physical exam today is normal. I will consult ENT to see if the patient may be a good candidate for palate reduction. His exam is not impressive in that regard. I believe he has a small corneal abrasion. Due to his allergies, he can use erythromycin ointment twice daily for 5 days until healed. I would like him to return fasting for CBC, CMP, fasting lipid panel. Recommended a flu shot but he politely declined

## 2016-01-07 ENCOUNTER — Other Ambulatory Visit: Payer: Self-pay | Admitting: Family Medicine

## 2016-01-07 ENCOUNTER — Other Ambulatory Visit: Payer: 59

## 2016-01-07 DIAGNOSIS — Z0000 Encounter for general adult medical examination without abnormal findings: Secondary | ICD-10-CM

## 2016-01-07 LAB — COMPLETE METABOLIC PANEL WITH GFR
ALT: 31 U/L (ref 9–46)
AST: 20 U/L (ref 10–40)
Albumin: 4.3 g/dL (ref 3.6–5.1)
Alkaline Phosphatase: 43 U/L (ref 40–115)
BUN: 17 mg/dL (ref 7–25)
CALCIUM: 9.3 mg/dL (ref 8.6–10.3)
CHLORIDE: 106 mmol/L (ref 98–110)
CO2: 27 mmol/L (ref 20–31)
Creat: 1.08 mg/dL (ref 0.60–1.35)
GFR, Est African American: >89 mL/min (ref >=60)
GFR, Est Non African American: >89 mL/min (ref >=60)
Glucose, Bld: 107 mg/dL — ABNORMAL HIGH (ref 70–99)
POTASSIUM: 5 mmol/L (ref 3.5–5.3)
Sodium: 141 mmol/L (ref 135–146)
Total Bilirubin: 0.9 mg/dL (ref 0.2–1.2)
Total Protein: 6.3 g/dL (ref 6.1–8.1)

## 2016-01-07 LAB — CBC WITH DIFFERENTIAL/PLATELET
Basophils Absolute: 0 cells/uL (ref 0–200)
Basophils Relative: 0 %
EOS PCT: 2 %
Eosinophils Absolute: 114 cells/uL (ref 15–500)
HEMATOCRIT: 44.5 % (ref 38.5–50.0)
Hemoglobin: 15 g/dL (ref 13.0–17.0)
LYMPHS PCT: 30 %
Lymphs Abs: 1710 cells/uL (ref 850–3900)
MCH: 31.4 pg (ref 27.0–33.0)
MCHC: 33.7 g/dL (ref 32.0–36.0)
MCV: 93.3 fL (ref 80.0–100.0)
MONOS PCT: 10 %
MPV: 10.3 fL (ref 7.5–12.5)
Monocytes Absolute: 570 cells/uL (ref 200–950)
NEUTROS PCT: 58 %
Neutro Abs: 3306 cells/uL (ref 1500–7800)
PLATELETS: 162 10*3/uL (ref 140–400)
RBC: 4.77 MIL/uL (ref 4.20–5.80)
RDW: 13.2 % (ref 11.0–15.0)
WBC: 5.7 10*3/uL (ref 3.8–10.8)

## 2016-01-07 LAB — LIPID PANEL
CHOL/HDL RATIO: 5 ratio — AB (ref <5.0)
CHOLESTEROL: 151 mg/dL (ref <200)
HDL: 30 mg/dL — ABNORMAL LOW (ref >40)
LDL Cholesterol: 97 mg/dL
Triglycerides: 120 mg/dL (ref <150)
VLDL: 24 mg/dL (ref <30)

## 2016-03-08 DIAGNOSIS — M6588 Other synovitis and tenosynovitis, other site: Secondary | ICD-10-CM | POA: Diagnosis not present

## 2016-03-30 ENCOUNTER — Encounter: Payer: Self-pay | Admitting: Neurology

## 2016-04-01 ENCOUNTER — Ambulatory Visit (INDEPENDENT_AMBULATORY_CARE_PROVIDER_SITE_OTHER): Payer: 59 | Admitting: Neurology

## 2016-04-01 ENCOUNTER — Encounter: Payer: Self-pay | Admitting: Neurology

## 2016-04-01 VITALS — BP 110/78 | HR 72 | Resp 16 | Ht 67.0 in | Wt 198.0 lb

## 2016-04-01 DIAGNOSIS — Z9989 Dependence on other enabling machines and devices: Secondary | ICD-10-CM | POA: Diagnosis not present

## 2016-04-01 DIAGNOSIS — G4733 Obstructive sleep apnea (adult) (pediatric): Secondary | ICD-10-CM | POA: Diagnosis not present

## 2016-04-01 NOTE — Patient Instructions (Addendum)
Please continue using your CPAP regularly. While your insurance requires that you use CPAP at least 4 hours each night on 70% of the nights, I recommend, that you not skip any nights and use it throughout the night if you can. Getting used to CPAP and staying with the treatment long term does take time and patience and discipline. Untreated obstructive sleep apnea when it is moderate to severe can have an adverse impact on cardiovascular health and raise her risk for heart disease, arrhythmias, hypertension, congestive heart failure, stroke and diabetes. Untreated obstructive sleep apnea causes sleep disruption, nonrestorative sleep, and sleep deprivation. This can have an impact on your day to day functioning and cause daytime sleepiness and impairment of cognitive function, memory loss, mood disturbance, and problems focussing. Using CPAP regularly can improve these symptoms.  We can see you in 1 year, you can see one of our nurse practitioners, I will see you after that.   Use lanolin ointment around the nostrils to help heal during the day, not at night.

## 2016-04-01 NOTE — Progress Notes (Signed)
Subjective:    Patient ID: Rick Maldonado is a 31 y.o. male.  HPI    Interim history:   Rick Maldonado is a 30 year old right-handed man with an underlying medical history of obesity, and reflux disease, who presents for follow-up consultation of his obstructive sleep apnea, on treatment with CPAP. The patient is unaccompanied today. I last saw him on 04/02/2015, at which time he reported having some difficulty tolerating CPAP due to acute illness. He was noticing a lot of leakage from the mask. He was also having some intermittent issues with restless leg symptoms and uncomfortable leg twitching at night. Overall, he admitted that CPAP was helpful and helped him sleep better. He was encouraged to be fully compliant with it.  Today, 04/01/2016 (all dictated new, as well as above notes, some dictation done in note pad or Word, outside of chart, may appear as copied):  I reviewed his CPAP compliance data from 03/01/2016 through 03/30/2016 which is a total of 30 days, during which time he used his machine 18 days with percent used days greater than 4 hours at 50%, indicating mediocre compliance with an average usage of 5 hours and 55 minutes, AHI 1.4 per hour on average, leak low, CPAP pressure at 8 cm. He reports that he has difficulty tolerating CPAP because he does not have central heating and air in his current home, sometimes the nose is too dry, sometimes, the humidity is too high. They have purchased a home and he is in the process of remodeling it and they're hoping to move into the new house within a years time. He works full-time at the Environmental [employer], [employer] has been fluctuating. He does have a new type of nasal pillows and likes it better. He does have some issues with sensitivity around the nostrils, left more than right. The patient's allergies, current medications, family history, past medical history, past social history, past surgical history and problem list were reviewed  and updated as appropriate.   Previously (copied from previous notes for reference):   I saw him on 09/26/2014, at which time he reported doing better. He had switched to a different nasal mask. He felt that his reflux had improved after he started CPAP therapy. He had also a change in his job in the interim and this was less stressful and required less traveling.   I reviewed his CPAP compliance data from 03/02/2015 through 03/31/2015 which is a total of 30 days during which time he used his machine 20 days with percent used days greater than 4 hours at 63%, indicating suboptimal compliance with an average usage of 4 hours and 45 minutes, residual AHI 0.8 per hour, leak low with the 95th percentile at 5.4 L/m on a pressure of 8 cm with EPR of 3.   I saw him 05/10/2014, at which time he reported that he was still struggling with the mask. He had tried different masks. He felt that his nocturnal reflux symptoms had improved with CPAP therapy and he was no longer using Nexium on a day-to-day basis. He did endorse improvement of his daytime somnolence, sleep quality and sleep consolidation. His compliance was about 50% at the time. He was encouraged to use CPAP regularly and not skip any nights. We talked about his sleep test results and compliance data in detail.    I reviewed his CPAP compliance data from 08/14/2014 through 09/12/2014 which is a total of 30 days during which time he used his machine only  22 days with percent used days greater than 4 hours at 60% only, indicating better, but still suboptimal compliance with an average usage of 4 hours and 24 minutes, residual AHI at 0.8 per hour, leaked low with the 95th percentile at 15 L/m on a pressure of 8 cm with EPR of 2.   I first met him on 12/29/2013 at the request of his primary care physician, at which time the patient reported snoring, nonrestorative sleep, witnessed apneas and excessive daytime somnolence. I invited him back for sleep study.  He had a split-night sleep study on 01/31/2014 and went over his test results with him in detail today. His sleep efficiency at baseline was reduced at 62.5% with a latency to sleep of 15 minutes and wake after sleep onset of 18 minutes with moderate sleep fragmentation noted. He had an elevated arousal index. He had an increased percentage of stage II sleep and absence of REM sleep prior to CPAP initiation. He had no significant PLMS or EKG changes. He had moderate to loud snoring. He had a total AHI of 52.4 per hour based primarily on hypopneas. His average oxygen saturation was 93% with a nadir of 87%. He was titrated on CPAP during the second part of the study. He had an increased percentage of slow-wave sleep and 25% of REM sleep. He was titrated on CPAP from 5-10 cm with a residual AHI of less than 1 per hour on a pressure of 8 cm with REM sleep achieved. Based on the test results I prescribed a CPAP machine for home use.    I reviewed his compliance data from 02/13/2014 through 03/14/2014 which is a total of 30 days during which time he is machine 27 days with percent days greater than 4 hours of 77%, indicating adequate compliance with an average usage of 5 hours and 44 minutes and residual AHI low at 0.8 per hour and a pressure of 8 cm with EPR of 1. Leak acceptable with the 95th percentile at 17.9 L/m.    I reviewed his compliance data from 04/09/2014 through 05/08/2014 which is a total of 30 days during which time he used his machine 18 days only. He reports that he had nasal congestion and also a six-day business trip that came up ad hoc. Percent used days greater than 4 hours was to 50% with an average usage of 3 hours and 46 minutes and residual AHI low at 0.8 per hour and leak low with the 95th percentile at 6.6 L/m on a pressure of 8.   His typical bedtime is reported to be around 9 to 10 PM and usual wake time is around 6:30 AM. Sleep onset typically occurs within minutes. He reports feeling  poorly rested upon awakening. He wakes up on an average 3 times in the middle of the night and has to go to the bathroom 1 times on a typical night. He denies morning headaches.   He reports excessive daytime somnolence (EDS) and His Epworth Sleepiness Score (ESS) is 14/24 today. He has been snoring for years. Snoring is loud. It is associated with witnessed pauses in his breathing per Rick Maldonado. He does wake up multiple times in the middle of the night. He reports a family history of sleep apnea and his father who has a CPAP machine.   He does not report any parasomnia but used to sleep talk as a child. He does endorse a need to move his legs before falling asleep and feeling uncomfortable in  his legs and also cold feet before he falls asleep. His leg movements disturb his Rick Maldonado. He does not smoke. He drinks alcohol about once a month. He drinks caffeine 1-2 sodas per day. He does not drink enough water he admits. He has a TV in the bedroom but does not typically watch it. However, he likes to play on his cell phone and repeat in his bed. They have 2 dogs which are not in his bedroom. His Rick Maldonado likes to sleep with a table fan. He has occasional nasal congestion and postnasal drip. He has occasional nighttime reflux issues and has just recently restarted taking Nexium.     His Past Medical History Is Significant For: Past Medical History:  Diagnosis Date  . Acid reflux   . Sleep apnea     His Past Surgical History Is Significant For: Past Surgical History:  Procedure Laterality Date  . TONSILLECTOMY     as a child    His Family History Is Significant For: Family History  Problem Relation Age of Onset  . Other Mother     drug addiction  . Arthritis Father     His Social History Is Significant For: Social History   Social History  . Marital status: Single    Spouse name: Rick Maldonado  . Number of children: 0  . Years of education: BS   Occupational History  .      [employer]   Social History  Main Topics  . Smoking status: Never Smoker  . Smokeless tobacco: Never Used  . Alcohol use Yes     Comment: rarely  . Drug use: No  . Sexual activity: Not Asked   Other Topics Concern  . None   Social History Narrative   Patient consumes 2 glasses of caffeine daily,is right handed    His Allergies Are:  Allergies  Allergen Reactions  . Codeine     Reaction unknown  . Morphine And Related     Throat closing  . Penicillins     Reaction unknown  . Sulfa Antibiotics     Reaction unknown  :   His Current Medications Are:  Outpatient Encounter Prescriptions as of 04/01/2016  Medication Sig  . UNABLE TO FIND CPAP for OSA  . [DISCONTINUED] erythromycin [facility]) ophthalmic ointment Place 1 application into the left eye 2 (two) times daily.   No facility-administered encounter medications on file as of 04/01/2016.   :  Review of Systems:  Out of a complete 14 point review of systems, all are reviewed and negative with the exception of these symptoms as listed below: Review of Systems  Neurological:       Patient states that he is doing ok with CPAP. Needs new CPAP supplies. Request a certain nasal mask.  DME: [facility]    Objective:  Neurologic Exam  Physical Exam Physical Examination:   Vitals:   04/01/16 1625  BP: 110/78  Pulse: 72  Resp: 16   General Examination: The patient is a very pleasant 31 y.o. male in no acute distress. He appears well-developed and well-nourished and well groomed.   HEENT: Normocephalic, atraumatic, pupils are equal, round and reactive to light and accommodation. Funduscopic exam is normal with sharp disc margins noted. Extraocular tracking is good without limitation to gaze excursion or nystagmus noted. Normal smooth pursuit is noted. Hearing is grossly intact. Tympanic membranes are clear bilaterally. Face is symmetric with normal facial animation and normal facial sensation. Speech is clear with no dysarthria noted.  There is no hypophonia.  There is no lip, neck/head, jaw or voice tremor. Neck is supple with full range of passive and active motion. There are no carotid bruits on auscultation. Oropharynx exam reveals: mild mouth dryness, good dental hygiene and moderate airway crowding. Mallampati is class II. Tongue protrudes centrally and palate elevates symmetrically. Tonsils absent. Nasal inspection reveals mild erythema/irritation at the medial aspect of the left nostril, left nostril and little smaller than right.  Chest: Clear to auscultation without wheezing, rhonchi or crackles noted.  Heart: S1+S2+0, regular and normal without murmurs, rubs or gallops noted.   Abdomen: Soft, non-tender and non-distended with normal bowel sounds appreciated on auscultation.  Extremities: There is no pitting edema in the distal lower extremities bilaterally. Pedal pulses are intact.  Skin: Warm and dry without trophic changes noted. There are no varicose veins.  Musculoskeletal: exam reveals no obvious joint deformities, tenderness or joint swelling or erythema.   Neurologically:  Mental status: The patient is awake, alert and oriented in all 4 spheres. His immediate and remote memory, attention, language skills and fund of knowledge are appropriate. There is no evidence of aphasia, agnosia, apraxia or anomia. Speech is clear with normal prosody and enunciation. Thought process is linear. Mood is normal and affect is normal.  Cranial nerves II - XII are as described above under HEENT exam. In addition: shoulder shrug is normal with equal shoulder height noted. Motor exam: Normal bulk, strength and tone is noted. There is no drift, tremor or rebound. Romberg is negative. Reflexes are 2+ throughout.  Fine motor skills and coordination: intact with normal finger taps, normal hand movements, normal rapid alternating patting, normal foot taps and normal foot agility.  Cerebellar testing: No dysmetria or intention tremor on finger to nose testing.  Heel to shin is unremarkable bilaterally. There is no truncal or gait ataxia.  Sensory exam: intact to light touch, and temp in the upper and lower extremities.  Gait, station and balance: He stands easily. No veering to one side is noted. No leaning to one side is noted. Posture is age-appropriate and stance is narrow based. Gait shows normal stride length and normal pace. No problems turning are noted. Tandem walk is unremarkable.   Assessment and Plan:   In summary, ZAYDE STROUPE is a very pleasant 31 y.o.-year old male with a Benign medical history other than allergic rhinitis and reflux disease as well as overweight state, who presents for follow-up consultation of his severe obstructive sleep apnea, established on CPAP therapy at 8 cm of water pressure via nasal pillows. He has had difficulty finding a interface that he can tolerate. Current nasal pillows are tolerable but he still has a sore area in the left nasal passage. He is advised to try lanolin for this. He is encouraged to use it only during the daytime to help the area heal, but not night. He has had adequate compliance in the past, most of the time his compliance percentage was in the 70s and 60s percent, little less lately. He is encouraged to try to be fully compliant and work on weight loss. His physical exam is stable. I suggested a one-year checkup, he can see one of our nurse practitioners at the time and I can see him back after that. I answered all his questions today and he was in agreement.  I spent 20 minutes in total face-to-face time with the patient, more than 50% of which was spent in counseling and coordination of care,  reviewing test results, reviewing medication and discussing or reviewing the diagnosis of OSA, its prognosis and treatment options. Pertinent laboratory and imaging test results that were available during this visit with the patient were reviewed by me and considered in my medical decision making (see chart  for details).

## 2016-05-13 DIAGNOSIS — G4733 Obstructive sleep apnea (adult) (pediatric): Secondary | ICD-10-CM | POA: Diagnosis not present

## 2016-09-14 DIAGNOSIS — G4733 Obstructive sleep apnea (adult) (pediatric): Secondary | ICD-10-CM | POA: Diagnosis not present

## 2017-01-07 ENCOUNTER — Other Ambulatory Visit: Payer: Self-pay | Admitting: Family Medicine

## 2017-01-07 DIAGNOSIS — Z0000 Encounter for general adult medical examination without abnormal findings: Secondary | ICD-10-CM

## 2017-01-08 ENCOUNTER — Other Ambulatory Visit: Payer: 59

## 2017-01-08 DIAGNOSIS — Z0000 Encounter for general adult medical examination without abnormal findings: Secondary | ICD-10-CM

## 2017-01-08 LAB — LIPID PANEL
Cholesterol: 162 mg/dL (ref <200)
HDL: 35 mg/dL — ABNORMAL LOW (ref >40)
LDL Cholesterol (Calc): 103 mg/dL (calc) — ABNORMAL HIGH
NON-HDL CHOLESTEROL (CALC): 127 mg/dL (ref <130)
TRIGLYCERIDES: 139 mg/dL (ref <150)
Total CHOL/HDL Ratio: 4.6 (calc) (ref <5.0)

## 2017-01-08 LAB — CBC WITH DIFFERENTIAL/PLATELET
Basophils Absolute: 40 cells/uL (ref 0–200)
Basophils Relative: 0.6 %
Eosinophils Absolute: 158 cells/uL (ref 15–500)
Eosinophils Relative: 2.4 %
HCT: 47.6 % (ref 38.5–50.0)
Hemoglobin: 16.2 g/dL (ref 13.2–17.1)
Lymphs Abs: 1901 cells/uL (ref 850–3900)
MCH: 31.2 pg (ref 27.0–33.0)
MCHC: 34 g/dL (ref 32.0–36.0)
MCV: 91.5 fL (ref 80.0–100.0)
MPV: 10.4 fL (ref 7.5–12.5)
Monocytes Relative: 9.1 %
NEUTROS PCT: 59.1 %
Neutro Abs: 3901 cells/uL (ref 1500–7800)
PLATELETS: 181 10*3/uL (ref 140–400)
RBC: 5.2 10*6/uL (ref 4.20–5.80)
RDW: 12.1 % (ref 11.0–15.0)
Total Lymphocyte: 28.8 %
WBC: 6.6 10*3/uL (ref 3.8–10.8)
WBCMIX: 601 {cells}/uL (ref 200–950)

## 2017-01-08 LAB — COMPLETE METABOLIC PANEL WITH GFR
AG Ratio: 2.1 (calc) (ref 1.0–2.5)
ALT: 26 U/L (ref 9–46)
AST: 19 U/L (ref 10–40)
Albumin: 4.6 g/dL (ref 3.6–5.1)
Alkaline phosphatase (APISO): 48 U/L (ref 40–115)
BUN: 18 mg/dL (ref 7–25)
CALCIUM: 9.7 mg/dL (ref 8.6–10.3)
CHLORIDE: 103 mmol/L (ref 98–110)
CO2: 26 mmol/L (ref 20–32)
CREATININE: 1.17 mg/dL (ref 0.60–1.35)
GFR, EST AFRICAN AMERICAN: 95 mL/min/{1.73_m2} (ref >=60)
GFR, EST NON AFRICAN AMERICAN: 82 mL/min/{1.73_m2} (ref >=60)
GLUCOSE: 113 mg/dL — AB (ref 65–99)
Globulin: 2.2 g/dL (calc) (ref 1.9–3.7)
Potassium: 4.7 mmol/L (ref 3.5–5.3)
Sodium: 138 mmol/L (ref 135–146)
TOTAL PROTEIN: 6.8 g/dL (ref 6.1–8.1)
Total Bilirubin: 1 mg/dL (ref 0.2–1.2)

## 2017-01-11 ENCOUNTER — Encounter: Payer: Self-pay | Admitting: Family Medicine

## 2017-01-11 ENCOUNTER — Encounter: Payer: 59 | Admitting: Family Medicine

## 2017-01-14 ENCOUNTER — Ambulatory Visit (INDEPENDENT_AMBULATORY_CARE_PROVIDER_SITE_OTHER): Payer: 59 | Admitting: Family Medicine

## 2017-01-14 ENCOUNTER — Encounter: Payer: Self-pay | Admitting: Family Medicine

## 2017-01-14 ENCOUNTER — Other Ambulatory Visit: Payer: Self-pay

## 2017-01-14 VITALS — BP 130/74 | HR 92 | Temp 98.5°F | Resp 12 | Ht 67.0 in | Wt 193.0 lb

## 2017-01-14 DIAGNOSIS — Z0000 Encounter for general adult medical examination without abnormal findings: Secondary | ICD-10-CM

## 2017-01-14 DIAGNOSIS — R7303 Prediabetes: Secondary | ICD-10-CM | POA: Insufficient documentation

## 2017-01-14 DIAGNOSIS — E785 Hyperlipidemia, unspecified: Secondary | ICD-10-CM | POA: Insufficient documentation

## 2017-01-14 MED ORDER — KETOCONAZOLE 2 % EX SHAM: 1.0000 "application " | MEDICATED_SHAMPOO | CUTANEOUS | 5 refills | Status: DC

## 2017-01-14 NOTE — Progress Notes (Signed)
Subjective:    Patient ID: Rick Maldonado, male    DOB: 07/27/1985, 31 y.o.   MRN: [id number]  HPI Patient is a very pleasant 31 year old white male here today for CPE.  His wife recently found out that she was pregnant with her first child.  He has not been exercising.  He admits to drinking several sodas a day.  Also eats a lot of carbohydrates.  Diabetes runs in his family.  He also has a family history of premature heart disease.  Unfortunately his father committed suicide earlier this year about 3 months ago.  Patient is dealing with this extremely well and seems to be coping.  He denies any depression or anhedonia.  Appointment on 01/08/2017  Component Date Value Ref Range Status  . Glucose, Bld 01/08/2017 113* 65 - 99 mg/dL Final   Comment: .            Fasting reference interval . For someone without known diabetes, a glucose value between 100 and 125 mg/dL is consistent with prediabetes and should be confirmed with a follow-up test. .   . BUN 01/08/2017 18  7 - 25 mg/dL Final  . Creat 40/98/119111/10/2016 1.17  0.60 - 1.35 mg/dL Final  . GFR, Est Non African American 01/08/2017 82  > OR = 60 mL/min/1.9273m2 Final  . GFR, Est African American 01/08/2017 95  > OR = 60 mL/min/1.2173m2 Final  . BUN/Creatinine Ratio 01/08/2017 NOT APPLICABLE  6 - 22 (calc) Final  . Sodium 01/08/2017 138  135 - 146 mmol/L Final  . Potassium 01/08/2017 4.7  3.5 - 5.3 mmol/L Final  . Chloride 01/08/2017 103  98 - 110 mmol/L Final  . CO2 01/08/2017 26  20 - 32 mmol/L Final  . Calcium 01/08/2017 9.7  8.6 - 10.3 mg/dL Final  . Total Protein 01/08/2017 6.8  6.1 - 8.1 g/dL Final  . Albumin 47/82/956211/10/2016 4.6  3.6 - 5.1 g/dL Final  . Globulin 13/08/657811/10/2016 2.2  1.9 - 3.7 g/dL (calc) Final  . AG Ratio 01/08/2017 2.1  1.0 - 2.5 (calc) Final  . Total Bilirubin 01/08/2017 1.0  0.2 - 1.2 mg/dL Final  . Alkaline phosphatase (APISO) 01/08/2017 48  40 - 115 U/L Final  . AST 01/08/2017 19  10 - 40 U/L Final  . ALT 01/08/2017 26   9 - 46 U/L Final  . Cholesterol 01/08/2017 162  <200 mg/dL Final  . HDL 46/96/295211/10/2016 35* >40 mg/dL Final  . Triglycerides 01/08/2017 139  <150 mg/dL Final  . LDL Cholesterol (Calc) 01/08/2017 103* mg/dL (calc) Final   Comment: Reference range: <100 . Desirable range <100 mg/dL for primary prevention;   <70 mg/dL for patients with CHD or diabetic patients  with > or = 2 CHD risk factors. Marland Kitchen. LDL-C is now calculated using the Martin-Hopkins  calculation, which is a validated novel method providing  better accuracy than the Friedewald equation in the  estimation of LDL-C.  Horald PollenMartin SS et al. Lenox AhrJAMA. 8413;244(012013;310(19): 2061-2068  (http://education.[facility].com/faq/FAQ164)   . Total CHOL/HDL Ratio 01/08/2017 4.6  <0.2<5.0 (calc) Final  . Non-HDL Cholesterol (Calc) 01/08/2017 127  <130 mg/dL (calc) Final   Comment: For patients with diabetes plus 1 major ASCVD risk  factor, treating to a non-HDL-C goal of <100 mg/dL  (LDL-C of <72<70 mg/dL) is considered a therapeutic  option.   . WBC 01/08/2017 6.6  3.8 - 10.8 Thousand/uL Final  . RBC 01/08/2017 5.20  4.20 - 5.80 Million/uL Final  . Hemoglobin 01/08/2017  16.2  13.2 - 17.1 g/dL Final  . HCT 16/10/960411/10/2016 47.6  38.5 - 50.0 % Final  . MCV 01/08/2017 91.5  80.0 - 100.0 fL Final  . MCH 01/08/2017 31.2  27.0 - 33.0 pg Final  . MCHC 01/08/2017 34.0  32.0 - 36.0 g/dL Final  . RDW 54/09/811911/10/2016 12.1  11.0 - 15.0 % Final  . Platelets 01/08/2017 181  140 - 400 Thousand/uL Final  . MPV 01/08/2017 10.4  7.5 - 12.5 fL Final  . Neutro Abs 01/08/2017 3,901  1,500 - 7,800 cells/uL Final  . Lymphs Abs 01/08/2017 1,901  850 - 3,900 cells/uL Final  . WBC mixed population 01/08/2017 601  200 - 950 cells/uL Final  . Eosinophils Absolute 01/08/2017 158  15 - 500 cells/uL Final  . Basophils Absolute 01/08/2017 40  0 - 200 cells/uL Final  . Neutrophils Relative % 01/08/2017 59.1  % Final  . Total Lymphocyte 01/08/2017 28.8  % Final  . Monocytes Relative 01/08/2017 9.1  %  Final  . Eosinophils Relative 01/08/2017 2.4  % Final  . Basophils Relative 01/08/2017 0.6  % Final    Past Medical History:  Diagnosis Date  . Acid reflux   . Sleep apnea     Past Surgical History:  Procedure Laterality Date  . TONSILLECTOMY     as a child   Current Outpatient Medications on File Prior to Visit  Medication Sig Dispense Refill  . UNABLE TO FIND CPAP for OSA     No current facility-administered medications on file prior to visit.    Allergies  Allergen Reactions  . Codeine     Reaction unknown  . Morphine And Related     Throat closing  . Penicillins     Reaction unknown  . Sulfa Antibiotics     Reaction unknown   Social History   Socioeconomic History  . Marital status: Single    Spouse name: Natalia LeatherwoodKatherine  . Number of children: 0  . Years of education: BS  . Highest education level: Not on file  Social Needs  . Financial resource strain: Not on file  . Food insecurity - worry: Not on file  . Food insecurity - inability: Not on file  . Transportation needs - medical: Not on file  . Transportation needs - non-medical: Not on file  Occupational History    Comment: [employer]  Tobacco Use  . Smoking status: Never Smoker  . Smokeless tobacco: Never Used  Substance and Sexual Activity  . Alcohol use: Yes    Comment: rarely  . Drug use: No  . Sexual activity: Not on file  Other Topics Concern  . Not on file  Social History Narrative   Patient consumes 2 glasses of caffeine daily,is right handed   Family History  Problem Relation Age of Onset  . Other Mother        drug addiction  . Arthritis Father    Father also has sleep apnea. Maternal grandmother has diabetes  Review of Systems  All other systems reviewed and are negative.      Objective:   Physical Exam  Constitutional: He is oriented to person, place, and time. He appears well-developed and well-nourished. No distress.  HENT:  Head: Normocephalic and atraumatic.  Right Ear:  External ear normal.  Left Ear: External ear normal.  Nose: Nose normal.  Mouth/Throat: Oropharynx is clear and moist. No oropharyngeal exudate.  Eyes: EOM are normal. Pupils are equal, round, and reactive to light. Right  eye exhibits no discharge. Left eye exhibits no discharge. No scleral icterus.  Neck: Normal range of motion. Neck supple. No JVD present. No tracheal deviation present. No thyromegaly present.  Cardiovascular: Normal rate, regular rhythm, normal heart sounds and intact distal pulses. Exam reveals no gallop and no friction rub.  No murmur heard. Pulmonary/Chest: Effort normal and breath sounds normal. No stridor. No respiratory distress. He has no wheezes. He has no rales. He exhibits no tenderness.  Abdominal: Soft. Bowel sounds are normal. He exhibits no distension and no mass. There is no tenderness. There is no rebound and no guarding. Hernia confirmed negative in the right inguinal area and confirmed negative in the left inguinal area.  Genitourinary: Testes normal and penis normal.  Musculoskeletal: Normal range of motion. He exhibits no edema, tenderness or deformity.  Lymphadenopathy:    He has no cervical adenopathy.       Right: No inguinal adenopathy present.       Left: No inguinal adenopathy present.  Neurological: He is alert and oriented to person, place, and time. He has normal reflexes. No cranial nerve deficit. He exhibits normal muscle tone. Coordination normal.  Skin: Skin is warm. No rash noted. He is not diaphoretic. No erythema. No pallor.  Psychiatric: He has a normal mood and affect. His behavior is normal. Judgment and thought content normal.  Vitals reviewed.         Assessment & Plan:  Routine general medical examination at a health care facility  Physical exam today is completely normal.  I am concerned about his blood work.  He is a prediabetic and an extremely early age.  I recommended drastic reductions in his consumption of  carbohydrates.  Discontinuation of all soda.  Eat a diet rich in fruits and vegetables and lean meat.  I recommended 30 minutes a day of aerobic exercise.  I congratulated the patient on his wife's pregnancy.  I recommended Tdap as well as a flu shot.  Patient declined both.  Recheck lab work in 6 months.

## 2017-03-31 ENCOUNTER — Encounter: Payer: Self-pay | Admitting: Neurology

## 2017-04-01 ENCOUNTER — Encounter (INDEPENDENT_AMBULATORY_CARE_PROVIDER_SITE_OTHER): Payer: Self-pay

## 2017-04-01 ENCOUNTER — Encounter: Payer: Self-pay | Admitting: Adult Health

## 2017-04-01 ENCOUNTER — Ambulatory Visit: Payer: 59 | Admitting: Adult Health

## 2017-04-01 VITALS — BP 136/83 | HR 88 | Ht 67.0 in | Wt 193.6 lb

## 2017-04-01 DIAGNOSIS — G4733 Obstructive sleep apnea (adult) (pediatric): Secondary | ICD-10-CM

## 2017-04-01 DIAGNOSIS — Z9989 Dependence on other enabling machines and devices: Secondary | ICD-10-CM | POA: Diagnosis not present

## 2017-04-01 NOTE — Patient Instructions (Signed)
Your Plan:  Try to use CPAP nightly and >4 hours each night If your symptoms worsen or you develop new symptoms please let us know.   Thank you for coming to see us at [facility]. I hope we have been able to provide you high quality care today.  You may receive a patient satisfaction survey over the next few weeks. We would appreciate your feedback and comments so that we may continue to improve ourselves and the health of our patients.  

## 2017-04-01 NOTE — Progress Notes (Addendum)
PATIENT: Rick OmanStephen B Maldonado DOB: 06/03/1985  REASON FOR VISIT: follow up HISTORY FROM: patient  HISTORY OF PRESENT ILLNESS: Today 04/01/17 Rick Maldonado is a 32 year old male with a history of obstructive sleep apnea on CPAP.  He reports that he has not been using his machine in the last 6 months.  He reports that he has been remodeling a home therefore he has at his regular job during the day plus working late at night.  He states once he gets home he does not look up his machine in order not to wake up his pregnant wife.  He does state in the last week they moved into their new home-so he does plan to use the machine more consistently.  On his download he did use the machine last 3 nights.  He also states that he plans to follow-up with his DME company to try the dream wear mask.  He returns today for evaluation.  HISTORY  04/01/2016: I reviewed his CPAP compliance data from 03/01/2016 through 03/30/2016 which is a total of 30 days, during which time he used his machine 18 days with percent used days greater than 4 hours at 50%, indicating mediocre compliance with an average usage of 5 hours and 55 minutes, AHI 1.4 per hour on average, leak low, CPAP pressure at 8 cm. He reports that he has difficulty tolerating CPAP because he does not have central heating and air in his current home, sometimes the nose is too dry, sometimes, the humidity is too high. They have purchased a home and he is in the process of remodeling it and they're hoping to move into the new house within a years time. He works full-time at the [employer], [employer] has been fluctuating. He does have a new type of nasal pillows and likes it better. He does have some issues with sensitivity around the nostrils, left more than right. The patient's allergies, current medications, family history, past medical history, past social history, past surgical history and problem list were reviewed and updated as appropriate.     REVIEW OF SYSTEMS: Out of a complete 14 system review of symptoms, the patient complains only of the following symptoms, and all other reviewed systems are negative.  ALLERGIES: Allergies  Allergen Reactions  . Codeine     Reaction unknown  . Morphine And Related     Throat closing  . Penicillins     Reaction unknown  . Sulfa Antibiotics     Reaction unknown    HOME MEDICATIONS: Outpatient Medications Prior to Visit  Medication Sig Dispense Refill  . ketoconazole (NIZORAL) 2 % shampoo Apply 1 application 2 (two) times a week topically. 120 mL 5  . UNABLE TO FIND CPAP for OSA     No facility-administered medications prior to visit.     PAST MEDICAL HISTORY: Past Medical History:  Diagnosis Date  . Acid reflux   . Dyslipidemia   . Prediabetes   . Sleep apnea     PAST SURGICAL HISTORY: Past Surgical History:  Procedure Laterality Date  . TONSILLECTOMY     as a child    FAMILY HISTORY: Family History  Problem Relation Age of Onset  . Other Mother        drug addiction  . Arthritis Father     SOCIAL HISTORY: Social History   Socioeconomic History  . Marital status: Single    Spouse name: Natalia LeatherwoodKatherine  . Number of children: 0  . Years of education:  BS  . Highest education level: Not on file  Social Needs  . Financial resource strain: Not on file  . Food insecurity - worry: Not on file  . Food insecurity - inability: Not on file  . Transportation needs - medical: Not on file  . Transportation needs - non-medical: Not on file  Occupational History    Comment: [employer]  Tobacco Use  . Smoking status: Never Smoker  . Smokeless tobacco: Never Used  Substance and Sexual Activity  . Alcohol use: Yes    Comment: rarely  . Drug use: No  . Sexual activity: Not on file  Other Topics Concern  . Not on file  Social History Narrative   Patient consumes 2 glasses of caffeine daily,is right handed      PHYSICAL EXAM  Vitals:   04/01/17 1450  BP:  136/83  Pulse: 88  Weight: 193 lb 9.6 oz (87.8 kg)  Height: 5\' 7"  (1.702 m)   Body mass index is 30.32 kg/m.  Generalized: Well developed, in no acute distress   Neurological examination  Mentation: Alert oriented to time, place, history taking. Follows all commands speech and language fluent Cranial nerve II-XII: Pupils were equal round reactive to light. Extraocular movements were full, visual field were full on confrontational test. Facial sensation and strength were normal. Uvula tongue midline. Head turning and shoulder shrug  were normal and symmetric. Motor: The motor testing reveals 5 over 5 strength of all 4 extremities. Good symmetric motor tone is noted throughout.  Sensory: Sensory testing is intact to soft touch on all 4 extremities. No evidence of extinction is noted.  Coordination: Cerebellar testing reveals good finger-nose-finger and heel-to-shin bilaterally.  Gait and station: Gait is normal. Reflexes: Deep tendon reflexes are symmetric and normal bilaterally.   DIAGNOSTIC DATA (LABS, IMAGING, TESTING) - I reviewed patient records, labs, notes, testing and imaging myself where available.  Lab Results  Component Value Date   WBC 6.6 01/08/2017   HGB 16.2 01/08/2017   HCT 47.6 01/08/2017   MCV 91.5 01/08/2017   PLT 181 01/08/2017      Component Value Date/Time   NA 138 01/08/2017 0756   K 4.7 01/08/2017 0756   CL 103 01/08/2017 0756   CO2 26 01/08/2017 0756   GLUCOSE 113 (H) 01/08/2017 0756   BUN 18 01/08/2017 0756   CREATININE 1.17 01/08/2017 0756   CALCIUM 9.7 01/08/2017 0756   PROT 6.8 01/08/2017 0756   ALBUMIN 4.3 01/07/2016 0825   AST 19 01/08/2017 0756   ALT 26 01/08/2017 0756   ALKPHOS 43 01/07/2016 0825   BILITOT 1.0 01/08/2017 0756   GFRNONAA 82 01/08/2017 0756   GFRAA 95 01/08/2017 0756   Lab Results  Component Value Date   CHOL 162 01/08/2017   HDL 35 (L) 01/08/2017   LDLCALC 97 01/07/2016   TRIG 139 01/08/2017   CHOLHDL 4.6 01/08/2017       ASSESSMENT AND PLAN 32 y.o. year old male  has a past medical history of Acid reflux, Dyslipidemia, Prediabetes, and Sleep apnea. here with:  1.  Obstructive sleep apnea on CPAP  I reviewed the risks associated with untreated sleep apnea.  He is encouraged to use his CPAP nightly and for greater than 4 hours each night.  He voiced understanding.  Reports that he plans to use his machine more consistently now that he is in his new home.  He will follow-up in 6 months or sooner if needed.  I spent 15  minutes with the patient. 50% of this time was spent reviewing his CPAP download    Butch Penny, MSN, NP-C 04/01/2017, 11:53 AM [facility] [address] [place], [place] [address] [phone]  I reviewed the above note and documentation by the Nurse Practitioner and agree with the history, physical exam, assessment and plan as outlined above. I was immediately available for face-to-face consultation. [name], MD, PhD [facility])

## 2017-04-15 DIAGNOSIS — G4733 Obstructive sleep apnea (adult) (pediatric): Secondary | ICD-10-CM | POA: Diagnosis not present

## 2017-07-23 ENCOUNTER — Ambulatory Visit (INDEPENDENT_AMBULATORY_CARE_PROVIDER_SITE_OTHER): Payer: 59

## 2017-07-23 DIAGNOSIS — Z23 Encounter for immunization: Secondary | ICD-10-CM

## 2017-07-23 NOTE — Progress Notes (Signed)
Patient was in office for Tdap injection. Patient received injection in his right deltoid. Patient tolerated well

## 2017-10-05 ENCOUNTER — Ambulatory Visit: Payer: 59 | Admitting: Adult Health

## 2017-10-25 ENCOUNTER — Encounter: Payer: Self-pay | Admitting: Neurology

## 2017-10-28 ENCOUNTER — Ambulatory Visit: Payer: 59 | Admitting: Neurology

## 2017-10-28 ENCOUNTER — Encounter: Payer: Self-pay | Admitting: Neurology

## 2017-10-28 VITALS — BP 120/85 | HR 52 | Ht 67.0 in | Wt 197.0 lb

## 2017-10-28 DIAGNOSIS — G4733 Obstructive sleep apnea (adult) (pediatric): Secondary | ICD-10-CM | POA: Diagnosis not present

## 2017-10-28 DIAGNOSIS — Z9989 Dependence on other enabling machines and devices: Secondary | ICD-10-CM

## 2017-10-28 NOTE — Patient Instructions (Signed)
Please continue using your CPAP regularly. While your insurance requires that you use CPAP at least 4 hours each night on 70% of the nights, I recommend, that you not skip any nights and use it throughout the night if you can. Getting used to CPAP and staying with the treatment long term does take time and patience and discipline. Untreated obstructive sleep apnea when it is moderate to severe can have an adverse impact on cardiovascular health and raise her risk for heart disease, arrhythmias, hypertension, congestive heart failure, stroke and diabetes. Untreated obstructive sleep apnea causes sleep disruption, nonrestorative sleep, and sleep deprivation. This can have an impact on your day to day functioning and cause daytime sleepiness and impairment of cognitive function, memory loss, mood disturbance, and problems focussing. Using CPAP regularly can improve these symptoms.  Please do the best you can with the CPAP.  You have had an ENT consultation before.  If you decide on the dental consultation for an oral appliance, I would be happy to make a referral.   Please try to lose weight.

## 2017-10-28 NOTE — Progress Notes (Signed)
Subjective:    Patient ID: Rick Maldonado is a 32 y.o. male.  HPI     Interim history:   Rick Maldonado is a 32 year-old right-handed man with an underlying medical history of obesity, and reflux disease, who presents for follow-up consultation of Rick Maldonado obstructive sleep apnea, on treatment with CPAP. The patient is unaccompanied today. I last saw Rick Maldonado on 04/01/2016, at which time Rick Maldonado was having some trouble tolerating Rick Maldonado CPAP. Rick Maldonado had a decline in Rick Maldonado compliance at the time.  Rick Maldonado saw Vaughan Browner, nurse practitioner in the interim on 04/01/2017, at which time Rick Maldonado reported Rick Maldonado had not been using Rick Maldonado CPAP secondary to working 2 jobs.  Today, 10/28/2017: I reviewed Rick Maldonado CPAP compliance data from 09/26/2017 through 10/25/2017 which is the past 30 days, during which time Rick Maldonado used Rick Maldonado CPAP only 8 days with percent used days greater than 4 hours at 23% only, indicating poor compliance with an average usage for days on treatment of 7 hours. Residual AHI 1.7 per hour, leaked low with the 95th percentile at 0 L/m on a pressure of 8 cm with EPR of 3. Rick Maldonado reports doing okay generally speaking, sleep is interrupted as Rick Maldonado has a newborn. Rick Maldonado baby is 57 months old. Rick Maldonado recently moved into Rick Maldonado new home which had better central heating and air and did not make it difficult for Rick Maldonado to use Rick Maldonado CPAP as in the old house Rick Maldonado had either too much moisture or very dry mouth. However, once the baby came along Rick Maldonado sleep has become very erratic. Rick Maldonado is hopeful that Rick Maldonado can get back on track more consistently. Rick Maldonado does have a travel machine and tries to take a when Rick Maldonado travels. Of note, in the past 90 days Rick Maldonado used Rick Maldonado CPAP 31 days with percent used days greater than 4 hours at 28% only. Rick Maldonado is up-to-date with Rick Maldonado supplies. Rick Maldonado had ENT consultation about a year ago. I could not review records. Rick Maldonado saw somebody in [place] and decided not to proceed with any surgical treatment. Rick Maldonado is not keen on getting an oral appliance.  The patient's  allergies, current medications, family history, past medical history, past social history, past surgical history and problem list were reviewed and updated as appropriate.    Previously (copied from previous notes for reference):   I saw Rick Maldonado on 04/02/2015, at which time Rick Maldonado reported having some difficulty tolerating CPAP due to acute illness. Rick Maldonado was noticing a lot of leakage from the mask. Rick Maldonado was also having some intermittent issues with restless leg symptoms and uncomfortable leg twitching at night. Overall, Rick Maldonado admitted that CPAP was helpful and helped Rick Maldonado sleep better. Rick Maldonado was encouraged to be fully compliant with it.   I reviewed Rick Maldonado CPAP compliance data from 03/01/2016 through 03/30/2016 which is a total of 30 days, during which time Rick Maldonado used Rick Maldonado machine 18 days with percent used days greater than 4 hours at 50%, indicating mediocre compliance with an average usage of 5 hours and 55 minutes, AHI 1.4 per hour on average, leak low, CPAP pressure at 8 cm.   I saw Rick Maldonado on 09/26/2014, at which time Rick Maldonado reported doing better. Rick Maldonado had switched to a different nasal mask. Rick Maldonado felt that Rick Maldonado reflux had improved after Rick Maldonado started CPAP therapy. Rick Maldonado had also a change in Rick Maldonado job in the interim and this was less stressful and required less traveling.   I reviewed Rick Maldonado CPAP compliance data from 03/02/2015 through 03/31/2015 which is a total  of 30 days during which time Rick Maldonado used Rick Maldonado machine 20 days with percent used days greater than 4 hours at 63%, indicating suboptimal compliance with an average usage of 4 hours and 45 minutes, residual AHI 0.8 per hour, leak low with the 95th percentile at 5.4 L/m on a pressure of 8 cm with EPR of 3.   I saw Rick Maldonado 05/10/2014, at which time Rick Maldonado reported that Rick Maldonado was still struggling with the mask. Rick Maldonado had tried different masks. Rick Maldonado felt that Rick Maldonado nocturnal reflux symptoms had improved with CPAP therapy and Rick Maldonado was no longer using Nexium on a day-to-day basis. Rick Maldonado did endorse improvement of Rick Maldonado daytime  somnolence, sleep quality and sleep consolidation. Rick Maldonado compliance was about 50% at the time. Rick Maldonado was encouraged to use CPAP regularly and not skip any nights. We talked about Rick Maldonado sleep test results and compliance data in detail.    I reviewed Rick Maldonado CPAP compliance data from 08/14/2014 through 09/12/2014 which is a total of 30 days during which time Rick Maldonado used Rick Maldonado machine only 22 days with percent used days greater than 4 hours at 60% only, indicating better, but still suboptimal compliance with an average usage of 4 hours and 24 minutes, residual AHI at 0.8 per hour, leaked low with the 95th percentile at 15 L/m on a pressure of 8 cm with EPR of 2.   I first met Rick Maldonado on 12/29/2013 at the request of Rick Maldonado primary care physician, at which time the patient reported snoring, nonrestorative sleep, witnessed apneas and excessive daytime somnolence. I invited Rick Maldonado back for sleep study. Rick Maldonado had a split-night sleep study on 01/31/2014 and went over Rick Maldonado test results with Rick Maldonado in detail today. Rick Maldonado sleep efficiency at baseline was reduced at 62.5% with a latency to sleep of 15 minutes and wake after sleep onset of 18 minutes with moderate sleep fragmentation noted. Rick Maldonado had an elevated arousal index. Rick Maldonado had an increased percentage of stage II sleep and absence of REM sleep prior to CPAP initiation. Rick Maldonado had no significant PLMS or EKG changes. Rick Maldonado had moderate to loud snoring. Rick Maldonado had a total AHI of 52.4 per hour based primarily on hypopneas. Rick Maldonado average oxygen saturation was 93% with a nadir of 87%. Rick Maldonado was titrated on CPAP during the second part of the study. Rick Maldonado had an increased percentage of slow-wave sleep and 25% of REM sleep. Rick Maldonado was titrated on CPAP from 5-10 cm with a residual AHI of less than 1 per hour on a pressure of 8 cm with REM sleep achieved. Based on the test results I prescribed a CPAP machine for home use.     I reviewed Rick Maldonado compliance data from 02/13/2014 through 03/14/2014 which is a total of 30 days during which time Rick Maldonado  is machine 27 days with percent days greater than 4 hours of 77%, indicating adequate compliance with an average usage of 5 hours and 44 minutes and residual AHI low at 0.8 per hour and a pressure of 8 cm with EPR of 1. Leak acceptable with the 95th percentile at 17.9 L/m.    I reviewed Rick Maldonado compliance data from 04/09/2014 through 05/08/2014 which is a total of 30 days during which time Rick Maldonado used Rick Maldonado machine 18 days only. Rick Maldonado reports that Rick Maldonado had nasal congestion and also a six-day business trip that came up ad hoc. Percent used days greater than 4 hours was to 50% with an average usage of 3 hours and 46 minutes and residual AHI low at 0.8  per hour and leak low with the 95th percentile at 6.6 L/m on a pressure of 8.   Rick Maldonado typical bedtime is reported to be around 9 to 10 PM and usual wake time is around 6:30 AM. Sleep onset typically occurs within minutes. Rick Maldonado reports feeling poorly rested upon awakening. Rick Maldonado wakes up on an average 3 times in the middle of the night and has to go to the bathroom 1 times on a typical night. Rick Maldonado denies morning headaches.   Rick Maldonado reports excessive daytime somnolence (EDS) and Rick Maldonado Epworth Sleepiness Score (ESS) is 14/24 today. Rick Maldonado has been snoring for years. Snoring is loud. It is associated with witnessed pauses in Rick Maldonado breathing per wife. Rick Maldonado does wake up multiple times in the middle of the night. Rick Maldonado reports a family history of sleep apnea and Rick Maldonado father who has a CPAP machine.   Rick Maldonado does not report any parasomnia but used to sleep talk as a child. Rick Maldonado does endorse a need to move Rick Maldonado legs before falling asleep and feeling uncomfortable in Rick Maldonado legs and also cold feet before Rick Maldonado falls asleep. Rick Maldonado leg movements disturb Rick Maldonado wife. Rick Maldonado does not smoke. Rick Maldonado drinks alcohol about once a month. Rick Maldonado drinks caffeine 1-2 sodas per day. Rick Maldonado does not drink enough water Rick Maldonado admits. Rick Maldonado has a TV in the bedroom but does not typically watch it. However, Rick Maldonado likes to play on Rick Maldonado cell phone and repeat in Rick Maldonado bed. They have 2  dogs which are not in Rick Maldonado bedroom. Rick Maldonado wife likes to sleep with a table fan. Rick Maldonado has occasional nasal congestion and postnasal drip. Rick Maldonado has occasional nighttime reflux issues and has just recently restarted taking Nexium.     Rick Maldonado Past Medical History Is Significant For: Past Medical History:  Diagnosis Date  . Acid reflux   . Dyslipidemia   . Prediabetes   . Sleep apnea     Rick Maldonado Past Surgical History Is Significant For: Past Surgical History:  Procedure Laterality Date  . TONSILLECTOMY     as a child    Rick Maldonado Family History Is Significant For: Family History  Problem Relation Age of Onset  . Other Mother        drug addiction  . Arthritis Father     Rick Maldonado Social History Is Significant For: Social History   Socioeconomic History  . Marital status: Single    Spouse name: Belenda Cruise  . Number of children: 0  . Years of education: BS  . Highest education level: Not on file  Occupational History    Comment: [employer]  Social Needs  . Financial resource strain: Not on file  . Food insecurity:    Worry: Not on file    Inability: Not on file  . Transportation needs:    Medical: Not on file    Non-medical: Not on file  Tobacco Use  . Smoking status: Never Smoker  . Smokeless tobacco: Never Used  Substance and Sexual Activity  . Alcohol use: Yes    Comment: rarely  . Drug use: No  . Sexual activity: Not on file  Lifestyle  . Physical activity:    Days per week: Not on file    Minutes per session: Not on file  . Stress: Not on file  Relationships  . Social connections:    Talks on phone: Not on file    Gets together: Not on file    Attends religious service: Not on file    Active member of club  or organization: Not on file    Attends meetings of clubs or organizations: Not on file    Relationship status: Not on file  Other Topics Concern  . Not on file  Social History Narrative   Patient consumes 2 glasses of caffeine daily,is right handed    Rick Maldonado Allergies Are:   Allergies  Allergen Reactions  . Codeine     Reaction unknown  . Morphine And Related     Throat closing  . Penicillins     Reaction unknown  . Sulfa Antibiotics     Reaction unknown  :   Rick Maldonado Current Medications Are:  Outpatient Encounter Medications as of 10/28/2017  Medication Sig  . ketoconazole (NIZORAL) 2 % shampoo Apply 1 application 2 (two) times a week topically.  Marland Kitchen UNABLE TO FIND CPAP for OSA   No facility-administered encounter medications on file as of 10/28/2017.   :  Review of Systems:  Out of a complete 14 point review of systems, all are reviewed and negative with the exception of these symptoms as listed below:   Review of Systems  Neurological:       Pt presents today to discuss Rick Maldonado cpap. Pt has a newborn baby at home and is finding it difficult to use Rick Maldonado cpap.    Objective:  Neurological Exam  Physical Exam Physical Examination:   Vitals:   10/28/17 1250  BP: 120/85  Pulse: (!) 52   General Examination: The patient is a very pleasant 32 y.o. male in no acute distress. Rick Maldonado appears well-developed and well-nourished and well groomed.   HEENT: Normocephalic, atraumatic, pupils are equal, round and reactive to light and accommodation. Extraocular tracking is good without limitation to gaze excursion or nystagmus noted. Normal smooth pursuit is noted. Hearing is grossly intact. Face is symmetric with normal facial animation and normal facial sensation. Speech is clear with no dysarthria noted. There is no hypophonia. There is no lip, neck/head, jaw or voice tremor. Neck shows FROM.   Chest: Clear to auscultation without wheezing, rhonchi or crackles noted.  Heart: S1+S2+0, regular and normal without murmurs, rubs or gallops noted.   Abdomen: Soft, non-tender and non-distended with normal bowel sounds appreciated on auscultation.  Extremities: There is no pitting edema in the distal lower extremities bilaterally.   Skin: Warm and dry without trophic  changes noted.  Musculoskeletal: exam reveals no obvious joint deformities, tenderness or joint swelling or erythema.   Neurologically:  Mental status: The patient is awake, alert and oriented in all 4 spheres. Rick Maldonado immediate and remote memory, attention, language skills and fund of knowledge are appropriate. There is no evidence of aphasia, agnosia, apraxia or anomia. Speech is clear with normal prosody and enunciation. Thought process is linear. Mood is normal and affect is normal.  Cranial nerves II - XII are as described above under HEENT exam.  Motor exam: Normal bulk, strength and tone is noted. There is no tremor or rebound. Fine motor skills and coordination: grossly intact.  Cerebellar testing: No dysmetria or intention tremor.  Sensory exam: intact to light touch.  Gait, station and balance: Rick Maldonado stands easily. No veering to one side is noted. No leaning to one side is noted. Posture is age-appropriate and stance is narrow based. Gait shows normal stride length and normal pace. No problems turning are noted.   Assessment and Plan:   In summary, SOUA CALTAGIRONE is a very pleasant 32 year old male with a history other than allergic rhinitis, reflux disease and  obesity, who presents for follow-up consultation of Rick Maldonado severe obstructive sleep apnea. Rick Maldonado has been on CPAP therapy. Rick Maldonado has had fluctuation in Rick Maldonado treatment adherence. Rick Maldonado has had difficulty with some tolerance of the nasal pillows in the past. Rick Maldonado has been using a nasal mask at this time. Rick Maldonado is motivated to continue with treatment. Rick Maldonado is advised that Rick Maldonado is suboptimally treated currently. Rick Maldonado is encouraged to be consistent with Rick Maldonado CPAP usage. Rick Maldonado had ENT consultation in the recent past. Rick Maldonado is encouraged to think about an oral appliance as a possibility for Rick Maldonado. Rick Maldonado is furthermore encouraged to lose weight. Rick Maldonado would like to think about the referral to dentistry. Rick Maldonado is encouraged to call our office if Rick Maldonado changes Rick Maldonado mind. In the interim, Rick Maldonado  is encouraged to be fully compliant with Rick Maldonado CPAP. We will see Rick Maldonado routinely in one year, Rick Maldonado can see Ward Givens, NP, again next time. I answered all Rick Maldonado questions today and Rick Maldonado was in agreement.  I spent 15 minutes in total face-to-face time with the patient, more than 50% of which was spent in counseling and coordination of care, reviewing test results, reviewing medication and discussing or reviewing the diagnosis of OSA, its prognosis and treatment options. Pertinent laboratory and imaging test results that were available during this visit with the patient were reviewed by me and considered in my medical decision making (see chart for details).

## 2017-11-12 ENCOUNTER — Other Ambulatory Visit: Payer: 59

## 2017-11-15 ENCOUNTER — Other Ambulatory Visit: Payer: 59

## 2017-11-15 ENCOUNTER — Ambulatory Visit
Admission: RE | Admit: 2017-11-15 | Discharge: 2017-11-15 | Disposition: A | Discharge status: Home / Self Care | Payer: 59 | Source: Ambulatory Visit | Attending: Family Medicine | Admitting: Family Medicine

## 2017-11-15 ENCOUNTER — Encounter: Payer: Self-pay | Admitting: Family Medicine

## 2017-11-15 ENCOUNTER — Ambulatory Visit: Payer: 59 | Admitting: Family Medicine

## 2017-11-15 VITALS — BP 110/80 | HR 52 | Temp 98.1°F | Resp 16 | Ht 67.0 in | Wt 199.0 lb

## 2017-11-15 DIAGNOSIS — M2550 Pain in unspecified joint: Secondary | ICD-10-CM

## 2017-11-15 DIAGNOSIS — R21 Rash and other nonspecific skin eruption: Secondary | ICD-10-CM

## 2017-11-15 DIAGNOSIS — M25562 Pain in left knee: Secondary | ICD-10-CM | POA: Diagnosis not present

## 2017-11-15 DIAGNOSIS — Z0000 Encounter for general adult medical examination without abnormal findings: Secondary | ICD-10-CM

## 2017-11-15 DIAGNOSIS — M25572 Pain in left ankle and joints of left foot: Secondary | ICD-10-CM | POA: Diagnosis not present

## 2017-11-15 NOTE — Progress Notes (Signed)
Subjective:    Patient ID: Rick Maldonado, male    DOB: 08/21/1985, 32 y.o.   MRN: [id number]  HPI  Over the last month, the patient has developed pain in the anterior lateral aspect of his left ankle.  He has normal flexion and extension without pain however it hurts to walk.  He attributed it to change in the foot wear that he was wearing.  However now he is developed pain in the lateral aspect of his left knee.  The pain in his left knee is now more severe than the pain in his left ankle.  He is also developed some pain in his left lower back as well as in the posterior aspect of his left hip.  The migrating joint pains have him concerned.  This week he has developed a rash on the right side of his neck and also on the lower left flank just above the greater trochanter.  The rash on his neck is approximately 9 x 3 cm.  It is a poorly circumscribed erythematous patch that does not itch hurt or burn.  It has serpiginous borders and is reticular in pattern.  It seems to be almost like hives.  There is an erythematous well-circumscribed plaque on his left lower flank just above the iliac crest.  It is 8 cm x 3 cm and has a sharp border and is slightly raised and appears to be a wheal.  Past Medical History:  Diagnosis Date  . Acid reflux   . Dyslipidemia   . Prediabetes   . Sleep apnea    Past Surgical History:  Procedure Laterality Date  . TONSILLECTOMY     as a child   Current Outpatient Medications on File Prior to Visit  Medication Sig Dispense Refill  . ketoconazole (NIZORAL) 2 % shampoo Apply 1 application 2 (two) times a week topically. 120 mL 5  . UNABLE TO FIND CPAP for OSA     No current facility-administered medications on file prior to visit.    Allergies  Allergen Reactions  . Codeine     Reaction unknown  . Morphine And Related     Throat closing  . Penicillins     Reaction unknown  . Sulfa Antibiotics     Reaction unknown   Social History   Socioeconomic  History  . Marital status: Single    Spouse name: [name]  . Number of children: 0  . Years of education: BS  . Highest education level: Not on file  Occupational History    Comment: [employer]  Social Needs  . Financial resource strain: Not on file  . Food insecurity:    Worry: Not on file    Inability: Not on file  . Transportation needs:    Medical: Not on file    Non-medical: Not on file  Tobacco Use  . Smoking status: Never Smoker  . Smokeless tobacco: Never Used  Substance and Sexual Activity  . Alcohol use: Yes    Comment: rarely  . Drug use: No  . Sexual activity: Not on file  Lifestyle  . Physical activity:    Days per week: Not on file    Minutes per session: Not on file  . Stress: Not on file  Relationships  . Social connections:    Talks on phone: Not on file    Gets together: Not on file    Attends religious service: Not on file    Active member of club or  organization: Not on file    Attends meetings of clubs or organizations: Not on file    Relationship status: Not on file  . Intimate partner violence:    Fear of current or ex partner: Not on file    Emotionally abused: Not on file    Physically abused: Not on file    Forced sexual activity: Not on file  Other Topics Concern  . Not on file  Social History Narrative   Patient consumes 2 glasses of caffeine daily,is right handed     Review of Systems     Objective:   Physical Exam  Constitutional: He appears well-developed and well-nourished. No distress.  Neck:    Cardiovascular: Normal rate and regular rhythm.  Pulmonary/Chest: Effort normal and breath sounds normal.  Musculoskeletal:       Left knee: He exhibits normal range of motion, no swelling, no effusion, no LCL laxity, normal meniscus and no MCL laxity. No medial joint line, no lateral joint line, no MCL and no LCL tenderness noted.       Left ankle: He exhibits normal range of motion, no swelling, no ecchymosis and no deformity. No  lateral malleolus, no medial malleolus and no AITFL tenderness found.       Back:  Lymphadenopathy:    He has no cervical adenopathy.  Skin: Rash noted. Rash is macular, papular and urticarial. He is not diaphoretic.  Vitals reviewed.    Rash over left hip is an urticarial plaque.  Rash on the right side of the neck is an erythematous reticular patch     Assessment & Plan:  Routine general medical examination at a health care facility - Plan: CBC with Differential/Platelet, Comprehensive metabolic panel, Lipid panel, CBC with Differential/Platelet, COMPLETE METABOLIC PANEL WITH GFR, Lipid panel  Polyarthralgia - Plan: B. burgdorfi antibodies by WB, DG Ankle Complete Left, DG Knee Complete 4 Views Left  Rash - Plan: B. burgdorfi antibodies by WB  Given the unusual plaque over his left hip and the polyarthralgias, I will screen the patient for possible Lyme disease although the appearance is atypical for erythema migrans.  I will obtain an x-ray of the ankle and an x-ray of the knee to rule out any structural abnormalities although the exam today is benign.  He has a physical exam scheduled for later this week and therefore I will also obtain a CBC, CMP, and a lipid panel.  If the x-rays are normal and the lab work is reassuring, I will treat the patient for possible tendinitis in his ankle.  I believe he likely irritated his knee and his left hip walking awkwardly on his left ankle to protect his ankle.  I believe the rash on his neck and his hip are urticarial in nature and may respond to an antihistamine.  Therefore if lab work and x-rays are reassuring, I will treat the patient with a combination of Xyzal a topical steroid cream such as triamcinolone and diclofenac for tendinitis of the ankle and then wait to see if his symptoms improve

## 2017-11-16 ENCOUNTER — Other Ambulatory Visit: Payer: Self-pay | Admitting: Family Medicine

## 2017-11-16 MED ORDER — LEVOCETIRIZINE DIHYDROCHLORIDE 5 MG PO TABS: 5.0000 mg | ORAL_TABLET | Freq: Every evening | ORAL | 2 refills | Status: DC

## 2017-11-16 MED ORDER — TRIAMCINOLONE ACETONIDE 0.1 % EX CREA: 1.0000 "application " | TOPICAL_CREAM | Freq: Two times a day (BID) | CUTANEOUS | 1 refills | Status: DC

## 2017-11-16 MED ORDER — MELOXICAM 15 MG PO TABS: 15.0000 mg | ORAL_TABLET | Freq: Every day | ORAL | 1 refills | Status: DC

## 2017-11-18 LAB — COMPREHENSIVE METABOLIC PANEL
AG Ratio: 2.3 (calc) (ref 1.0–2.5)
ALKALINE PHOSPHATASE (APISO): 48 U/L (ref 40–115)
ALT: 24 U/L (ref 9–46)
AST: 18 U/L (ref 10–40)
Albumin: 4.6 g/dL (ref 3.6–5.1)
BUN: 19 mg/dL (ref 7–25)
CO2: 27 mmol/L (ref 20–32)
CREATININE: 1.11 mg/dL (ref 0.60–1.35)
Calcium: 9.7 mg/dL (ref 8.6–10.3)
Chloride: 104 mmol/L (ref 98–110)
Globulin: 2 g/dL (calc) (ref 1.9–3.7)
Glucose, Bld: 107 mg/dL — ABNORMAL HIGH (ref 65–99)
Potassium: 5.2 mmol/L (ref 3.5–5.3)
Sodium: 139 mmol/L (ref 135–146)
Total Bilirubin: 1.1 mg/dL (ref 0.2–1.2)
Total Protein: 6.6 g/dL (ref 6.1–8.1)

## 2017-11-18 LAB — CBC WITH DIFFERENTIAL/PLATELET
BASOS ABS: 19 {cells}/uL (ref 0–200)
Basophils Relative: 0.3 %
Eosinophils Absolute: 132 cells/uL (ref 15–500)
Eosinophils Relative: 2.1 %
HEMATOCRIT: 44.3 % (ref 38.5–50.0)
Hemoglobin: 15.4 g/dL (ref 13.2–17.1)
LYMPHS ABS: 1153 {cells}/uL (ref 850–3900)
MCH: 31.2 pg (ref 27.0–33.0)
MCHC: 34.8 g/dL (ref 32.0–36.0)
MCV: 89.9 fL (ref 80.0–100.0)
MPV: 10.5 fL (ref 7.5–12.5)
Monocytes Relative: 12.8 %
NEUTROS PCT: 66.5 %
Neutro Abs: 4190 cells/uL (ref 1500–7800)
PLATELETS: 176 10*3/uL (ref 140–400)
RBC: 4.93 10*6/uL (ref 4.20–5.80)
RDW: 12.5 % (ref 11.0–15.0)
TOTAL LYMPHOCYTE: 18.3 %
WBC: 6.3 10*3/uL (ref 3.8–10.8)
WBCMIX: 806 {cells}/uL (ref 200–950)

## 2017-11-18 LAB — B. BURGDORFI ANTIBODIES BY WB
B burgdorferi IgG Abs (IB): NEGATIVE
B burgdorferi IgM Abs (IB): NEGATIVE
LYME DISEASE 28 KD IGG: NONREACTIVE
LYME DISEASE 30 KD IGG: NONREACTIVE
LYME DISEASE 41 KD IGM: NONREACTIVE
LYME DISEASE 58 KD IGG: NONREACTIVE
Lyme Disease 18 kD IgG: NONREACTIVE
Lyme Disease 23 kD IgG: NONREACTIVE
Lyme Disease 23 kD IgM: NONREACTIVE
Lyme Disease 39 kD IgG: NONREACTIVE
Lyme Disease 39 kD IgM: NONREACTIVE
Lyme Disease 41 kD IgG: NONREACTIVE
Lyme Disease 45 kD IgG: NONREACTIVE
Lyme Disease 66 kD IgG: NONREACTIVE
Lyme Disease 93 kD IgG: NONREACTIVE

## 2017-11-18 LAB — LIPID PANEL
CHOL/HDL RATIO: 3.9 (calc) (ref <5.0)
CHOLESTEROL: 137 mg/dL (ref <200)
HDL: 35 mg/dL — AB (ref >40)
LDL CHOLESTEROL (CALC): 75 mg/dL
Non-HDL Cholesterol (Calc): 102 mg/dL (calc) (ref <130)
Triglycerides: 171 mg/dL — ABNORMAL HIGH (ref <150)

## 2017-11-19 ENCOUNTER — Ambulatory Visit (INDEPENDENT_AMBULATORY_CARE_PROVIDER_SITE_OTHER): Payer: 59 | Admitting: Family Medicine

## 2017-11-19 ENCOUNTER — Encounter: Payer: Self-pay | Admitting: Family Medicine

## 2017-11-19 VITALS — BP 124/80 | HR 62 | Temp 98.2°F | Resp 14 | Ht 67.0 in | Wt 196.0 lb

## 2017-11-19 DIAGNOSIS — E785 Hyperlipidemia, unspecified: Secondary | ICD-10-CM

## 2017-11-19 DIAGNOSIS — Z0000 Encounter for general adult medical examination without abnormal findings: Secondary | ICD-10-CM | POA: Diagnosis not present

## 2017-11-19 DIAGNOSIS — R7303 Prediabetes: Secondary | ICD-10-CM

## 2017-11-19 NOTE — Progress Notes (Signed)
Subjective:    Patient ID: Rick Maldonado, male    DOB: 02/05/1986, 32 y.o.   MRN: [id number]  HPI Patient is a 32 year old white male here today for complete physical exam.  Please see his last office visit.  Lab work was unremarkable as were his x-rays.  The rash on the right side of his neck and on his left flank are fading on Xyzal and triamcinolone however he continues to have mild pain in his knee and his ankle as well as in his lower back.  He is taking meloxicam on a daily basis but is only been on the medication 2 to 3 days.  He is here today for complete physical exam.  He is due for a flu shot.  Past medical history significant for mild borderline prediabetes as well as dyslipidemia with elevated triglycerides and low HDL cholesterol.  Otherwise he is doing well with no concerns Office Visit on 11/15/2017  Component Date Value Ref Range Status  . WBC 11/15/2017 6.3  3.8 - 10.8 Thousand/uL Final  . RBC 11/15/2017 4.93  4.20 - 5.80 Million/uL Final  . Hemoglobin 11/15/2017 15.4  13.2 - 17.1 g/dL Final  . HCT 40/98/1191 44.3  38.5 - 50.0 % Final  . MCV 11/15/2017 89.9  80.0 - 100.0 fL Final  . MCH 11/15/2017 31.2  27.0 - 33.0 pg Final  . MCHC 11/15/2017 34.8  32.0 - 36.0 g/dL Final  . RDW 47/82/9562 12.5  11.0 - 15.0 % Final  . Platelets 11/15/2017 176  140 - 400 Thousand/uL Final  . MPV 11/15/2017 10.5  7.5 - 12.5 fL Final  . Neutro Abs 11/15/2017 4,190  1,500 - 7,800 cells/uL Final  . Lymphs Abs 11/15/2017 1,153  850 - 3,900 cells/uL Final  . WBC mixed population 11/15/2017 806  200 - 950 cells/uL Final  . Eosinophils Absolute 11/15/2017 132  15 - 500 cells/uL Final  . Basophils Absolute 11/15/2017 19  0 - 200 cells/uL Final  . Neutrophils Relative % 11/15/2017 66.5  % Final  . Total Lymphocyte 11/15/2017 18.3  % Final  . Monocytes Relative 11/15/2017 12.8  % Final  . Eosinophils Relative 11/15/2017 2.1  % Final  . Basophils Relative 11/15/2017 0.3  % Final  . Glucose, Bld  11/15/2017 107* 65 - 99 mg/dL Final   Comment: .            Fasting reference interval . For someone without known diabetes, a glucose value between 100 and 125 mg/dL is consistent with prediabetes and should be confirmed with a follow-up test. .   . BUN 11/15/2017 19  7 - 25 mg/dL Final  . Creat 13/09/6576 1.11  0.60 - 1.35 mg/dL Final  . BUN/Creatinine Ratio 11/15/2017 NOT APPLICABLE  6 - 22 (calc) Final  . Sodium 11/15/2017 139  135 - 146 mmol/L Final  . Potassium 11/15/2017 5.2  3.5 - 5.3 mmol/L Final  . Chloride 11/15/2017 104  98 - 110 mmol/L Final  . CO2 11/15/2017 27  20 - 32 mmol/L Final  . Calcium 11/15/2017 9.7  8.6 - 10.3 mg/dL Final  . Total Protein 11/15/2017 6.6  6.1 - 8.1 g/dL Final  . Albumin 46/96/2952 4.6  3.6 - 5.1 g/dL Final  . Globulin 84/13/2440 2.0  1.9 - 3.7 g/dL (calc) Final  . AG Ratio 11/15/2017 2.3  1.0 - 2.5 (calc) Final  . Total Bilirubin 11/15/2017 1.1  0.2 - 1.2 mg/dL Final  . Alkaline phosphatase (APISO) 11/15/2017  48  40 - 115 U/L Final  . AST 11/15/2017 18  10 - 40 U/L Final  . ALT 11/15/2017 24  9 - 46 U/L Final  . Cholesterol 11/15/2017 137  <200 mg/dL Final  . HDL 16/12/9602 35* >40 mg/dL Final  . Triglycerides 11/15/2017 171* <150 mg/dL Final  . LDL Cholesterol (Calc) 11/15/2017 75  mg/dL (calc) Final   Comment: Reference range: <100 . Desirable range <100 mg/dL for primary prevention;   <70 mg/dL for patients with CHD or diabetic patients  with > or = 2 CHD risk factors. Marland Kitchen LDL-C is now calculated using the Martin-Hopkins  calculation, which is a validated novel method providing  better accuracy than the Friedewald equation in the  estimation of LDL-C.  Horald Pollen et al. Lenox Ahr. 5409;811(91): 2061-2068  (http://education.[facility].com/faq/FAQ164)   . Total CHOL/HDL Ratio 11/15/2017 3.9  <4.7 (calc) Final  . Non-HDL Cholesterol (Calc) 11/15/2017 102  <130 mg/dL (calc) Final   Comment: For patients with diabetes plus 1 major ASCVD  risk  factor, treating to a non-HDL-C goal of <100 mg/dL  (LDL-C of <82 mg/dL) is considered a therapeutic  option.   . B burgdorferi IgG Abs (IB) 11/15/2017 NEGATIVE  NEGATIVE Final  . Lyme Disease 18 kD IgG 11/15/2017 NON-REACTIVE   Final  . Lyme Disease 23 kD IgG 11/15/2017 NON-REACTIVE   Final  . Lyme Disease 28 kD IgG 11/15/2017 NON-REACTIVE   Final  . Lyme Disease 30 kD IgG 11/15/2017 NON-REACTIVE   Final  . Lyme Disease 39 kD IgG 11/15/2017 NON-REACTIVE   Final  . Lyme Disease 41 kD IgG 11/15/2017 NON-REACTIVE   Final  . Lyme Disease 45 kD IgG 11/15/2017 NON-REACTIVE   Final  . Lyme Disease 58 kD IgG 11/15/2017 NON-REACTIVE   Final  . Lyme Disease 66 kD IgG 11/15/2017 NON-REACTIVE   Final  . Lyme Disease 93 kD IgG 11/15/2017 NON-REACTIVE   Final  . B burgdorferi IgM Abs (IB) 11/15/2017 NEGATIVE  NEGATIVE Final  . Lyme Disease 23 kD IgM 11/15/2017 NON-REACTIVE   Final  . Lyme Disease 39 kD IgM 11/15/2017 NON-REACTIVE   Final  . Lyme Disease 41 kD IgM 11/15/2017 NON-REACTIVE   Final   Comment: As per CDC criteria, a Lyme disease IgG Immunoblot must show reactivity to at least 5 of 10 specific borrelial proteins to be considered positive; similarly, a  positive Lyme disease IgM immunoblot requires reactivity to 2 of 3 specific borrelial proteins. Although considered negative, IgG reactivity to fewer specific borrelial proteins or IgM reactivity to only 1 protein may indicate recent B. burgdorferi infection and warrant testing of a later sample. A positive IgM but negative IgG result obtained more than a month after onset of symptoms likely represents a false- positive IgM result rather than acute Lyme disease. In rare instances, Lyme disease immunoblot reactivity may represent antibodies induced by exposure to other spirochetes.  . Lyme immunoblot testing should only be performed on samples from patients who have had a Positive or Equivocal result in a screening assay.      Past Medical History:  Diagnosis Date  . Acid reflux   . Dyslipidemia   . Prediabetes   . Sleep apnea     Past Surgical History:  Procedure Laterality Date  . TONSILLECTOMY     as a child   Current Outpatient Medications on File Prior to Visit  Medication Sig Dispense Refill  . ketoconazole (NIZORAL) 2 % shampoo Apply 1 application 2 (two) times a  week topically. 120 mL 5  . levocetirizine (XYZAL) 5 MG tablet Take 1 tablet (5 mg total) by mouth every evening. 90 tablet 2  . meloxicam (MOBIC) 15 MG tablet Take 1 tablet (15 mg total) by mouth daily. 90 tablet 1  . triamcinolone cream (KENALOG) 0.1 % Apply 1 application topically 2 (two) times daily. 80 g 1  . UNABLE TO FIND CPAP for OSA     No current facility-administered medications on file prior to visit.    Allergies  Allergen Reactions  . Codeine     Reaction unknown  . Morphine And Related     Throat closing  . Penicillins     Reaction unknown  . Sulfa Antibiotics     Reaction unknown   Social History   Socioeconomic History  . Marital status: Single    Spouse name: [name]  . Number of children: 0  . Years of education: BS  . Highest education level: Not on file  Occupational History    Comment: [employer]  Social Needs  . Financial resource strain: Not on file  . Food insecurity:    Worry: Not on file    Inability: Not on file  . Transportation needs:    Medical: Not on file    Non-medical: Not on file  Tobacco Use  . Smoking status: Never Smoker  . Smokeless tobacco: Never Used  Substance and Sexual Activity  . Alcohol use: Yes    Comment: rarely  . Drug use: No  . Sexual activity: Not on file  Lifestyle  . Physical activity:    Days per week: Not on file    Minutes per session: Not on file  . Stress: Not on file  Relationships  . Social connections:    Talks on phone: Not on file    Gets together: Not on file    Attends religious service: Not on file    Active member of club or  organization: Not on file    Attends meetings of clubs or organizations: Not on file    Relationship status: Not on file  . Intimate partner violence:    Fear of current or ex partner: Not on file    Emotionally abused: Not on file    Physically abused: Not on file    Forced sexual activity: Not on file  Other Topics Concern  . Not on file  Social History Narrative   Patient consumes 2 glasses of caffeine daily,is right handed   Family History  Problem Relation Age of Onset  . Other Mother        drug addiction  . Arthritis Father    Father also has sleep apnea. Maternal grandmother has diabetes  Review of Systems  All other systems reviewed and are negative.      Objective:   Physical Exam  Constitutional: He is oriented to person, place, and time. He appears well-developed and well-nourished. No distress.  HENT:  Head: Normocephalic and atraumatic.  Right Ear: External ear normal.  Left Ear: External ear normal.  Nose: Nose normal.  Mouth/Throat: Oropharynx is clear and moist. No oropharyngeal exudate.  Eyes: Pupils are equal, round, and reactive to light. EOM are normal. Right eye exhibits no discharge. Left eye exhibits no discharge. No scleral icterus.  Neck: Normal range of motion. Neck supple. No JVD present. No tracheal deviation present. No thyromegaly present.  Cardiovascular: Normal rate, regular rhythm, normal heart sounds and intact distal pulses. Exam reveals no gallop and  no friction rub.  No murmur heard. Pulmonary/Chest: Effort normal and breath sounds normal. No stridor. No respiratory distress. He has no wheezes. He has no rales. He exhibits no tenderness.  Abdominal: Soft. Bowel sounds are normal. He exhibits no distension and no mass. There is no tenderness. There is no rebound and no guarding. Hernia confirmed negative in the right inguinal area and confirmed negative in the left inguinal area.  Genitourinary: Testes normal and penis normal.    Musculoskeletal: Normal range of motion. He exhibits no edema, tenderness or deformity.  Lymphadenopathy:    He has no cervical adenopathy.       Right: No inguinal adenopathy present.       Left: No inguinal adenopathy present.  Neurological: He is alert and oriented to person, place, and time. He has normal reflexes. No cranial nerve deficit. He exhibits normal muscle tone. Coordination normal.  Skin: Skin is warm. No rash noted. He is not diaphoretic. No erythema. No pallor.  Psychiatric: He has a normal mood and affect. His behavior is normal. Judgment and thought content normal.  Vitals reviewed.         Assessment & Plan:  Routine general medical examination at a health care facility  Prediabetes  Dyslipidemia  Blood sugar has improved since last year although it is still elevated at 107.  Continue to encourage a low carbohydrate diet, avoidance of soda and sweet tea, and getting regular aerobic exercise.  Continue to encourage a diet rich in fruits and vegetables, low in saturated fat and carbohydrates such as bread rice potatoes and pasta.  Recommended weight loss.  Patient has borderline metabolic syndrome and I explained a great deal of time explaining that over time this increases his risk for cardiovascular disease.  We discussed therapeutic lifestyle changes to address this.  I recommended his flu shot however the patient declined that today as he gets it free at work.

## 2017-11-30 DIAGNOSIS — M5137 Other intervertebral disc degeneration, lumbosacral region: Secondary | ICD-10-CM | POA: Diagnosis not present

## 2017-11-30 DIAGNOSIS — M5417 Radiculopathy, lumbosacral region: Secondary | ICD-10-CM | POA: Diagnosis not present

## 2017-11-30 DIAGNOSIS — M9903 Segmental and somatic dysfunction of lumbar region: Secondary | ICD-10-CM | POA: Diagnosis not present

## 2017-12-01 DIAGNOSIS — M9903 Segmental and somatic dysfunction of lumbar region: Secondary | ICD-10-CM | POA: Diagnosis not present

## 2017-12-01 DIAGNOSIS — M5137 Other intervertebral disc degeneration, lumbosacral region: Secondary | ICD-10-CM | POA: Diagnosis not present

## 2017-12-01 DIAGNOSIS — M5417 Radiculopathy, lumbosacral region: Secondary | ICD-10-CM | POA: Diagnosis not present

## 2017-12-03 DIAGNOSIS — M5417 Radiculopathy, lumbosacral region: Secondary | ICD-10-CM | POA: Diagnosis not present

## 2017-12-03 DIAGNOSIS — M9903 Segmental and somatic dysfunction of lumbar region: Secondary | ICD-10-CM | POA: Diagnosis not present

## 2017-12-03 DIAGNOSIS — M5137 Other intervertebral disc degeneration, lumbosacral region: Secondary | ICD-10-CM | POA: Diagnosis not present

## 2018-04-01 ENCOUNTER — Encounter: Payer: Self-pay | Admitting: Family Medicine

## 2018-04-01 ENCOUNTER — Ambulatory Visit: Payer: 59 | Admitting: Family Medicine

## 2018-04-01 VITALS — BP 110/64 | HR 54 | Temp 98.1°F | Resp 16 | Ht 67.0 in | Wt 199.0 lb

## 2018-04-01 DIAGNOSIS — M5432 Sciatica, left side: Secondary | ICD-10-CM

## 2018-04-01 NOTE — Progress Notes (Signed)
Subjective:    Patient ID: Rick Maldonado, male    DOB: 06/21/1985, 33 y.o.   MRN: [id number]  HPI  Patient mentioned leg pain at his physical exam in September.  At that time he started meloxicam on a daily basis for the leg pain.  He is here today continuing to report pain in his leg.  He has been taking 15 mg of meloxicam every day and has seen no benefit.  Pain originates in his lower back and then radiates into his left posterior hip.  It then radiates down his left leg to the lateral aspect of his left knee.  He will then experience some pain radiating down his left leg into his foot and ankle.  The pain is sharp and aching.  It is not made worse by bending his knee or bending his ankle.  He has no pain with range of motion in his knee or ankle or hip.  The pain is made worse by prolonged standing.  It does improve with stretching his lower back.  He also reports pins-and-needles in his feet.  He has had the pain now off and on for more than a year.  He has been trying meloxicam for the last 3 months without relief.  The pain seems to be getting worse  Past Medical History:  Diagnosis Date  . Acid reflux   . Dyslipidemia   . Prediabetes   . Sleep apnea    Past Surgical History:  Procedure Laterality Date  . TONSILLECTOMY     as a child   Current Outpatient Medications on File Prior to Visit  Medication Sig Dispense Refill  . ketoconazole (NIZORAL) 2 % shampoo Apply 1 application 2 (two) times a week topically. 120 mL 5  . levocetirizine (XYZAL) 5 MG tablet Take 1 tablet (5 mg total) by mouth every evening. 90 tablet 2  . meloxicam (MOBIC) 15 MG tablet Take 1 tablet (15 mg total) by mouth daily. 90 tablet 1  . triamcinolone cream (KENALOG) 0.1 % Apply 1 application topically 2 (two) times daily. 80 g 1  . UNABLE TO FIND CPAP for OSA     No current facility-administered medications on file prior to visit.    Allergies  Allergen Reactions  . Codeine     Reaction unknown  .  Morphine And Related     Throat closing  . Penicillins     Reaction unknown  . Sulfa Antibiotics     Reaction unknown   Social History   Socioeconomic History  . Marital status: Single    Spouse name: Natalia LeatherwoodKatherine  . Number of children: 0  . Years of education: BS  . Highest education level: Not on file  Occupational History    Comment: [employer]  Social Needs  . Financial resource strain: Not on file  . Food insecurity:    Worry: Not on file    Inability: Not on file  . Transportation needs:    Medical: Not on file    Non-medical: Not on file  Tobacco Use  . Smoking status: Never Smoker  . Smokeless tobacco: Never Used  Substance and Sexual Activity  . Alcohol use: Yes    Comment: rarely  . Drug use: No  . Sexual activity: Not on file  Lifestyle  . Physical activity:    Days per week: Not on file    Minutes per session: Not on file  . Stress: Not on file  Relationships  . Social  connections:    Talks on phone: Not on file    Gets together: Not on file    Attends religious service: Not on file    Active member of club or organization: Not on file    Attends meetings of clubs or organizations: Not on file    Relationship status: Not on file  . Intimate partner violence:    Fear of current or ex partner: Not on file    Emotionally abused: Not on file    Physically abused: Not on file    Forced sexual activity: Not on file  Other Topics Concern  . Not on file  Social History Narrative   Patient consumes 2 glasses of caffeine daily,is right handed    Review of Systems  All other systems reviewed and are negative.      Objective:   Physical Exam Vitals signs reviewed.  Constitutional:      Appearance: Normal appearance.  Cardiovascular:     Rate and Rhythm: Normal rate and regular rhythm.     Heart sounds: Normal heart sounds.  Pulmonary:     Effort: Pulmonary effort is normal.     Breath sounds: Normal breath sounds.  Musculoskeletal:     Left hip: He  exhibits normal range of motion and normal strength.     Left knee: He exhibits normal range of motion and normal meniscus. No medial joint line, no lateral joint line, no MCL and no LCL tenderness noted.     Left ankle: He exhibits normal range of motion. No tenderness. No lateral malleolus, no medial malleolus and no AITFL tenderness found.     Lumbar back: He exhibits decreased range of motion, tenderness and pain.  Neurological:     Mental Status: He is alert.           Assessment & Plan:  Left sided sciatica - Plan: MR Lumbar Spine Wo Contrast  Patient's pain is consistent with sciatica.  Begin with an MRI of the lumbar spine to evaluate further as the patient has tried and failed more than 6 weeks of conservative therapy.  Based on the results of the MRI, we may recommend physical therapy versus gabapentin for epidural steroid injections.

## 2018-04-15 ENCOUNTER — Telehealth: Payer: Self-pay | Admitting: Family Medicine

## 2018-04-15 NOTE — Telephone Encounter (Signed)
Patient calling regarding mri results? I dont see any. Could you look at this for me?

## 2018-04-18 NOTE — Telephone Encounter (Signed)
LMTRC

## 2018-04-18 NOTE — Telephone Encounter (Signed)
Per Dr. Tanya Nones  - Nerves possibly being pinched at L% d/t degeneration in the disc at that level. Recommend neurosurgery consult for possible ESI.

## 2018-04-19 NOTE — Telephone Encounter (Signed)
Pt aware of results and has been doing some physical therapy at home and his back has gotten better and would like to try PT before scheduling for ESI.

## 2018-04-25 ENCOUNTER — Encounter: Payer: Self-pay | Admitting: Family Medicine

## 2018-04-25 DIAGNOSIS — M5137 Other intervertebral disc degeneration, lumbosacral region: Secondary | ICD-10-CM | POA: Insufficient documentation

## 2018-05-13 ENCOUNTER — Telehealth: Payer: Self-pay | Admitting: Family Medicine

## 2018-05-13 ENCOUNTER — Other Ambulatory Visit: Payer: Self-pay | Admitting: Family Medicine

## 2018-05-13 MED ORDER — TRAMADOL HCL 50 MG PO TABS: 50.0000 mg | ORAL_TABLET | Freq: Three times a day (TID) | ORAL | 0 refills | Status: DC | PRN

## 2018-05-13 MED ORDER — PREDNISONE 20 MG PO TABS: ORAL_TABLET | ORAL | 0 refills | Status: DC

## 2018-05-13 NOTE — Telephone Encounter (Signed)
I will send in prednisone taper pack and norco.

## 2018-05-13 NOTE — Telephone Encounter (Signed)
Patient called in stating he has an appointment with Dr. Jule Ser on the 24th of this month. However his back pain is so bad he has missed the past two days of work and would like to know what he can do for pain management until he sees the Neurologist.  CB# [phone]

## 2018-05-13 NOTE — Telephone Encounter (Signed)
Pt aware.

## 2018-05-13 NOTE — Telephone Encounter (Signed)
Recommendations? (See last phone note b/c he stated to me that his back pain was better)

## 2018-11-03 ENCOUNTER — Encounter: Payer: Self-pay | Admitting: Adult Health

## 2018-11-03 ENCOUNTER — Other Ambulatory Visit: Payer: Self-pay

## 2018-11-03 ENCOUNTER — Ambulatory Visit: Payer: 59 | Admitting: Adult Health

## 2018-11-03 VITALS — BP 126/75 | HR 85 | Temp 97.8°F | Ht 67.0 in | Wt 191.0 lb

## 2018-11-03 DIAGNOSIS — Z9989 Dependence on other enabling machines and devices: Secondary | ICD-10-CM | POA: Diagnosis not present

## 2018-11-03 DIAGNOSIS — G4733 Obstructive sleep apnea (adult) (pediatric): Secondary | ICD-10-CM

## 2018-11-03 NOTE — Patient Instructions (Signed)

## 2018-11-03 NOTE — Progress Notes (Addendum)
PATIENT: Rick Maldonado DOB: [birth year]  REASON FOR VISIT: follow up HISTORY FROM: patient  HISTORY OF PRESENT ILLNESS: Today 11/03/18:  Rick Maldonado is a 33 year old male with a history of obstructive sleep apnea on CPAP.  He returns today for follow-up.  His download indicates that he use his machine 18 out of 30 days for compliance of 60%.  He uses machine greater than 4 hours 15 days for compliance of 50%.  On average he uses his machine 6 hours and 19 minutes.  His residual AHI is 1 on 8 cm of water with EPR 3.  He does not have a significant leak.  He states that there are some nights he is unable to use the machine because he is having back pain.  He has had one spinal injection and is getting another today.  He states that since he changed his mask he has been able to use it more often.  HISTORY 10/28/2017: I reviewed his CPAP compliance data from 09/26/2017 through 10/25/2017 which is the past 30 days, during which time he used his CPAP only 8 days with percent used days greater than 4 hours at 23% only, indicating poor compliance with an average usage for days on treatment of 7 hours. Residual AHI 1.7 per hour, leaked low with the 95th percentile at 0 L/m on a pressure of 8 cm with EPR of 3. He reports doing okay generally speaking, sleep is interrupted as he has a newborn. His baby is 12 months old. He recently moved into his new home which had better central heating and air and did not make it difficult for him to use his CPAP as in the old house he had either too much moisture or very dry mouth. However, once the baby came along his sleep has become very erratic. He is hopeful that he can get back on track more consistently. He does have a travel machine and tries to take a when he travels. Of note, in the past 90 days he used his CPAP 31 days with percent used days greater than 4 hours at 28% only. He is up-to-date with his supplies. He had ENT consultation about a year ago. I could not  review records. He saw somebody in [place] and decided not to proceed with any surgical treatment. He is not keen on getting an oral appliance.   REVIEW OF SYSTEMS: Out of a complete 14 system review of symptoms, the patient complains only of the following symptoms, and all other reviewed systems are negative.  See HPI  ALLERGIES: Allergies  Allergen Reactions  . Codeine     Reaction unknown  . Morphine And Related     Throat closing  . Penicillins     Reaction unknown  . Sulfa Antibiotics     Reaction unknown    HOME MEDICATIONS: Outpatient Medications Prior to Visit  Medication Sig Dispense Refill  . ketoconazole (NIZORAL) 2 % shampoo Apply 1 application 2 (two) times a week topically. 120 mL 5  . levocetirizine (XYZAL) 5 MG tablet Take 1 tablet (5 mg total) by mouth every evening. 90 tablet 2  . predniSONE (DELTASONE) 20 MG tablet 3 tabs poqday 1-2, 2 tabs poqday 3-4, 1 tab poqday 5-6 12 tablet 0  . traMADol (ULTRAM) 50 MG tablet Take 1 tablet (50 mg total) by mouth every 8 (eight) hours as needed. 30 tablet 0  . triamcinolone cream (KENALOG) 0.1 % Apply 1 application topically 2 (two) times daily. 80  g 1  . UNABLE TO FIND CPAP for OSA     No facility-administered medications prior to visit.     PAST MEDICAL HISTORY: Past Medical History:  Diagnosis Date  . Acid reflux   . DDD (degenerative disc disease), lumbosacral    bilateral neural foraminal stenosis at L5-S1, mass effect on B exiting L5 nerve roots (2/20)  . Dyslipidemia   . Prediabetes   . Sleep apnea     PAST SURGICAL HISTORY: Past Surgical History:  Procedure Laterality Date  . TONSILLECTOMY     as a child    FAMILY HISTORY: Family History  Problem Relation Age of Onset  . Other Mother        drug addiction  . Arthritis Father     SOCIAL HISTORY: Social History   Socioeconomic History  . Marital status: Single    Spouse name: Belenda Cruise  . Number of children: 0  . Years of education: BS   . Highest education level: Not on file  Occupational History    Comment: [employer]  Social Needs  . Financial resource strain: Not on file  . Food insecurity    Worry: Not on file    Inability: Not on file  . Transportation needs    Medical: Not on file    Non-medical: Not on file  Tobacco Use  . Smoking status: Never Smoker  . Smokeless tobacco: Never Used  Substance and Sexual Activity  . Alcohol use: Yes    Comment: rarely  . Drug use: No  . Sexual activity: Not on file  Lifestyle  . Physical activity    Days per week: Not on file    Minutes per session: Not on file  . Stress: Not on file  Relationships  . Social Herbalist on phone: Not on file    Gets together: Not on file    Attends religious service: Not on file    Active member of club or organization: Not on file    Attends meetings of clubs or organizations: Not on file    Relationship status: Not on file  . Intimate partner violence    Fear of current or ex partner: Not on file    Emotionally abused: Not on file    Physically abused: Not on file    Forced sexual activity: Not on file  Other Topics Concern  . Not on file  Social History Narrative   Patient consumes 2 glasses of caffeine daily,is right handed      PHYSICAL EXAM  Vitals:   11/03/18 1310  BP: 126/75  Pulse: 85  Temp: 97.8 F (36.6 C)  TempSrc: Oral  Weight: 191 lb (86.6 kg)  Height: 5\' 7"  (1.702 m)   Body mass index is 29.91 kg/m.  Generalized: Well developed, in no acute distress  Chest: Lungs clear to auscultation bilaterally  Neurological examination  Mentation: Alert oriented to time, place, history taking. Follows all commands speech and language fluent Cranial nerve II-XII: Extraocular movements were full, visual field were full on confrontational test. Head turning and shoulder shrug  were normal and symmetric. Motor: The motor testing reveals 5 over 5 strength of all 4 extremities. Good symmetric motor tone  is noted throughout.  Sensory: Sensory testing is intact to soft touch on all 4 extremities. No evidence of extinction is noted.  Gait and station: Gait is normal.    DIAGNOSTIC DATA (LABS, IMAGING, TESTING) - I reviewed patient records, labs, notes, testing  and imaging myself where available.  Lab Results  Component Value Date   WBC 6.3 11/15/2017   HGB 15.4 11/15/2017   HCT 44.3 11/15/2017   MCV 89.9 11/15/2017   PLT 176 11/15/2017      Component Value Date/Time   NA 139 11/15/2017 0925   K 5.2 11/15/2017 0925   CL 104 11/15/2017 0925   CO2 27 11/15/2017 0925   GLUCOSE 107 (H) 11/15/2017 0925   BUN 19 11/15/2017 0925   CREATININE 1.11 11/15/2017 0925   CALCIUM 9.7 11/15/2017 0925   PROT 6.6 11/15/2017 0925   ALBUMIN 4.3 01/07/2016 0825   AST 18 11/15/2017 0925   ALT 24 11/15/2017 0925   ALKPHOS 43 01/07/2016 0825   BILITOT 1.1 11/15/2017 0925   GFRNONAA 82 01/08/2017 0756   GFRAA 95 01/08/2017 0756   Lab Results  Component Value Date   CHOL 137 11/15/2017   HDL 35 (L) 11/15/2017   LDLCALC 75 11/15/2017   TRIG 171 (H) 11/15/2017   CHOLHDL 3.9 11/15/2017   She thinks   ASSESSMENT AND PLAN 33 y.o. year old male  has a past medical history of Acid reflux, DDD (degenerative disc disease), lumbosacral, Dyslipidemia, Prediabetes, and Sleep apnea. here with:  1.  Obstructive sleep apnea on CPAP  The patient's compliance has improved since his visit last year.  He is encouraged to continue using the CPAP nightly and greater than 4 hours each night.  We reviewed risk associated with untreated sleep apnea.  He is advised that if his symptoms worsen or he develops new symptoms he should let us know.  He will follow-up in 6 months or sooner if needed.   I spent 15 minutes with the patient. 50% of this time was spent discussing his CPAP download  Butch PennyMegan Rilen Shukla, MSN, NP-C 11/03/2018, 11:53 AM [facility] [address], [address] [place], [place] [address]  [phone]  I reviewed the above note and documentation by the Nurse Practitioner and agree with the history, exam, assessment and plan as outlined above. I was immediately available for consultation. Huston FoleySaima Athar, MD, PhD [facility])

## 2018-12-16 ENCOUNTER — Other Ambulatory Visit: Payer: Self-pay | Admitting: Neurosurgery

## 2018-12-26 ENCOUNTER — Other Ambulatory Visit: Payer: Self-pay | Admitting: Neurosurgery

## 2018-12-28 ENCOUNTER — Other Ambulatory Visit: Payer: Self-pay | Admitting: *Deleted

## 2019-01-06 ENCOUNTER — Other Ambulatory Visit: Payer: Self-pay | Admitting: Neurosurgery

## 2019-01-06 ENCOUNTER — Other Ambulatory Visit (HOSPITAL_COMMUNITY): Payer: Self-pay | Admitting: Neurosurgery

## 2019-01-06 ENCOUNTER — Telehealth: Payer: Self-pay | Admitting: *Deleted

## 2019-01-06 DIAGNOSIS — Q762 Congenital spondylolisthesis: Secondary | ICD-10-CM

## 2019-01-09 ENCOUNTER — Telehealth: Payer: Self-pay | Admitting: *Deleted

## 2019-01-13 ENCOUNTER — Encounter: Payer: Self-pay | Admitting: Family Medicine

## 2019-01-13 ENCOUNTER — Other Ambulatory Visit: Payer: Self-pay

## 2019-01-13 ENCOUNTER — Ambulatory Visit (INDEPENDENT_AMBULATORY_CARE_PROVIDER_SITE_OTHER): Payer: 59 | Admitting: Family Medicine

## 2019-01-13 VITALS — BP 130/88 | HR 76 | Temp 97.1°F | Resp 14 | Ht 67.0 in | Wt 191.0 lb

## 2019-01-13 DIAGNOSIS — E785 Hyperlipidemia, unspecified: Secondary | ICD-10-CM

## 2019-01-13 DIAGNOSIS — R7303 Prediabetes: Secondary | ICD-10-CM | POA: Diagnosis not present

## 2019-01-13 DIAGNOSIS — Z0001 Encounter for general adult medical examination with abnormal findings: Secondary | ICD-10-CM | POA: Diagnosis not present

## 2019-01-13 DIAGNOSIS — Z0000 Encounter for general adult medical examination without abnormal findings: Secondary | ICD-10-CM

## 2019-01-13 MED ORDER — TIZANIDINE HCL 4 MG PO TABS: 4.0000 mg | ORAL_TABLET | Freq: Four times a day (QID) | ORAL | 0 refills | Status: DC | PRN

## 2019-01-13 MED ORDER — GABAPENTIN 300 MG PO CAPS: 300.0000 mg | ORAL_CAPSULE | Freq: Three times a day (TID) | ORAL | 3 refills | Status: DC

## 2019-01-13 NOTE — Progress Notes (Signed)
Subjective:    Patient ID: Rick Maldonado, male    DOB: 08/24/1985, 33 y.o.   MRN: [id number]  HPI  Patient is here today for complete physical exam.  Over the last year he has been dealing with severe left lower back pain, constant muscle spasms, and left lumbar radiculopathy.  He has been seeing a neurosurgeon and is scheduled for a spinal fusion in January.  Unfortunately he is having severe muscle spasms in his left lower back as well as neuropathic pain radiating down his left leg.  He is only taking diclofenac occasionally for pain.  He states the pain is severe.  Otherwise he is doing well.  He declines a flu shot.  He does have a history of prediabetes and is therefore due for fasting lab work. Past Medical History:  Diagnosis Date  . Acid reflux   . DDD (degenerative disc disease), lumbosacral    bilateral neural foraminal stenosis at L5-S1, mass effect on B exiting L5 nerve roots (2/20)  . Dyslipidemia   . Prediabetes   . Sleep apnea    Past Surgical History:  Procedure Laterality Date  . TONSILLECTOMY     as a child   Current Outpatient Medications on File Prior to Visit  Medication Sig Dispense Refill  . diclofenac (VOLTAREN) 75 MG EC tablet Take 75 mg by mouth 2 (two) times daily.    Marland Kitchen. UNABLE TO FIND CPAP for OSA     No current facility-administered medications on file prior to visit.    Allergies  Allergen Reactions  . Codeine     Reaction unknown  . Morphine And Related     Throat closing  . Penicillins     Reaction unknown  . Sulfa Antibiotics     Reaction unknown   Social History   Socioeconomic History  . Marital status: Single    Spouse name: Natalia LeatherwoodKatherine  . Number of children: 0  . Years of education: BS  . Highest education level: Not on file  Occupational History    Comment: [employer]  Social Needs  . Financial resource strain: Not on file  . Food insecurity    Worry: Not on file    Inability: Not on file  . Transportation needs    Medical:  Not on file    Non-medical: Not on file  Tobacco Use  . Smoking status: Never Smoker  . Smokeless tobacco: Never Used  Substance and Sexual Activity  . Alcohol use: Yes    Comment: rarely  . Drug use: No  . Sexual activity: Not on file  Lifestyle  . Physical activity    Days per week: Not on file    Minutes per session: Not on file  . Stress: Not on file  Relationships  . Social Musicianconnections    Talks on phone: Not on file    Gets together: Not on file    Attends religious service: Not on file    Active member of club or organization: Not on file    Attends meetings of clubs or organizations: Not on file    Relationship status: Not on file  . Intimate partner violence    Fear of current or ex partner: Not on file    Emotionally abused: Not on file    Physically abused: Not on file    Forced sexual activity: Not on file  Other Topics Concern  . Not on file  Social History Narrative   Patient consumes 2 glasses  of caffeine daily,is right handed     Review of Systems     Objective:   Physical Exam Vitals signs reviewed.  Constitutional:      General: He is not in acute distress.    Appearance: Normal appearance. He is well-developed and normal weight. He is not ill-appearing, toxic-appearing or diaphoretic.  HENT:     Head: Normocephalic and atraumatic.     Right Ear: Tympanic membrane and ear canal normal. There is no impacted cerumen.     Left Ear: Tympanic membrane and ear canal normal. There is no impacted cerumen.     Nose: Nose normal. No congestion or rhinorrhea.     Mouth/Throat:     Mouth: Mucous membranes are moist.     Pharynx: No oropharyngeal exudate or posterior oropharyngeal erythema.  Eyes:     General: No scleral icterus.       Right eye: No discharge.        Left eye: No discharge.     Extraocular Movements: Extraocular movements intact.     Conjunctiva/sclera: Conjunctivae normal.     Pupils: Pupils are equal, round, and reactive to light.   Neck:     Musculoskeletal: Normal range of motion and neck supple. No neck rigidity or muscular tenderness.     Vascular: No carotid bruit.  Cardiovascular:     Rate and Rhythm: Normal rate and regular rhythm.     Pulses: Normal pulses.     Heart sounds: Normal heart sounds. No murmur. No friction rub. No gallop.   Pulmonary:     Effort: Pulmonary effort is normal. No respiratory distress.     Breath sounds: Normal breath sounds. No stridor. No wheezing, rhonchi or rales.  Chest:     Chest wall: No tenderness.  Abdominal:     General: Abdomen is flat. Bowel sounds are normal. There is no distension.     Palpations: Abdomen is soft. There is no mass.     Tenderness: There is no abdominal tenderness. There is no guarding or rebound.     Hernia: No hernia is present.  Musculoskeletal:     Lumbar back: He exhibits decreased range of motion, tenderness, pain and spasm.       Back:     Right lower leg: No edema.     Left lower leg: No edema.  Lymphadenopathy:     Cervical: No cervical adenopathy.  Skin:    General: Skin is warm.     Coloration: Skin is not jaundiced or pale.     Findings: No bruising, erythema, lesion or rash.  Neurological:     General: No focal deficit present.     Mental Status: He is alert and oriented to person, place, and time.     Cranial Nerves: No cranial nerve deficit.     Sensory: No sensory deficit.     Motor: No weakness.     Coordination: Coordination normal.     Gait: Gait normal.     Deep Tendon Reflexes: Reflexes normal.  Psychiatric:        Mood and Affect: Mood normal.        Behavior: Behavior normal.        Thought Content: Thought content normal.        Judgment: Judgment normal.    Assessment & Plan:  Routine general medical examination at a health care facility - Plan: CBC with Differential, COMPLETE METABOLIC PANEL WITH GFR, Lipid Panel, Hemoglobin A1c  Dyslipidemia - Plan: CBC  with Differential, COMPLETE METABOLIC PANEL WITH GFR,  Lipid Panel, Hemoglobin A1c  Prediabetes - Plan: CBC with Differential, COMPLETE METABOLIC PANEL WITH GFR, Lipid Panel, Hemoglobin A1c  Recommended a flu shot but the patient declined.  Blood pressure today is acceptable.  Recommended returning fasting to check a CBC, CMP, fasting lipid panel.  Given his history of prediabetes I would also check a hemoglobin A1c.  Gave the patient tizanidine 4 mg every 6 hours as needed for muscle spasms in his lower back.  If that is not effective he can try gabapentin 300 mg every 8 hours as needed for neuropathic pain radiating down his left leg.

## 2019-01-17 ENCOUNTER — Other Ambulatory Visit: Payer: Self-pay | Admitting: *Deleted

## 2019-01-17 ENCOUNTER — Ambulatory Visit (HOSPITAL_COMMUNITY)
Admission: RE | Admit: 2019-01-17 | Discharge: 2019-01-17 | Disposition: A | Discharge status: Home / Self Care | Payer: 59 | Source: Ambulatory Visit | Attending: Neurosurgery | Admitting: Neurosurgery

## 2019-01-17 ENCOUNTER — Other Ambulatory Visit: Payer: Self-pay

## 2019-01-17 DIAGNOSIS — Q762 Congenital spondylolisthesis: Secondary | ICD-10-CM | POA: Insufficient documentation

## 2019-01-24 ENCOUNTER — Encounter: Payer: 59 | Admitting: Vascular Surgery

## 2019-01-25 ENCOUNTER — Other Ambulatory Visit: Payer: Self-pay

## 2019-01-25 ENCOUNTER — Other Ambulatory Visit: Payer: 59

## 2019-01-26 LAB — CBC WITH DIFFERENTIAL/PLATELET
Absolute Monocytes: 616 cells/uL (ref 200–950)
Basophils Absolute: 30 cells/uL (ref 0–200)
Basophils Relative: 0.4 %
Eosinophils Absolute: 99 cells/uL (ref 15–500)
Eosinophils Relative: 1.3 %
HCT: 46.6 % (ref 38.5–50.0)
Hemoglobin: 15.7 g/dL (ref 13.2–17.1)
Lymphs Abs: 2060 cells/uL (ref 850–3900)
MCH: 31.4 pg (ref 27.0–33.0)
MCHC: 33.7 g/dL (ref 32.0–36.0)
MCV: 93.2 fL (ref 80.0–100.0)
MPV: 10.5 fL (ref 7.5–12.5)
Monocytes Relative: 8.1 %
Neutro Abs: 4796 cells/uL (ref 1500–7800)
Neutrophils Relative %: 63.1 %
Platelets: 198 10*3/uL (ref 140–400)
RBC: 5 10*6/uL (ref 4.20–5.80)
RDW: 12.3 % (ref 11.0–15.0)
Total Lymphocyte: 27.1 %
WBC: 7.6 10*3/uL (ref 3.8–10.8)

## 2019-01-26 LAB — LIPID PANEL
Cholesterol: 178 mg/dL (ref <200)
HDL: 37 mg/dL — ABNORMAL LOW (ref >=40)
LDL Cholesterol (Calc): 115 mg/dL (calc) — ABNORMAL HIGH
Non-HDL Cholesterol (Calc): 141 mg/dL (calc) — ABNORMAL HIGH (ref <130)
Total CHOL/HDL Ratio: 4.8 (calc) (ref <5.0)
Triglycerides: 148 mg/dL (ref <150)

## 2019-01-26 LAB — COMPLETE METABOLIC PANEL WITH GFR
AG Ratio: 2.2 (calc) (ref 1.0–2.5)
ALT: 32 U/L (ref 9–46)
AST: 18 U/L (ref 10–40)
Albumin: 4.6 g/dL (ref 3.6–5.1)
Alkaline phosphatase (APISO): 48 U/L (ref 36–130)
BUN: 19 mg/dL (ref 7–25)
CO2: 26 mmol/L (ref 20–32)
Calcium: 9.7 mg/dL (ref 8.6–10.3)
Chloride: 104 mmol/L (ref 98–110)
Creat: 1.18 mg/dL (ref 0.60–1.35)
GFR, Est African American: 93 mL/min/{1.73_m2} (ref >=60)
GFR, Est Non African American: 80 mL/min/{1.73_m2} (ref >=60)
Globulin: 2.1 g/dL (calc) (ref 1.9–3.7)
Glucose, Bld: 101 mg/dL — ABNORMAL HIGH (ref 65–99)
Potassium: 4.8 mmol/L (ref 3.5–5.3)
Sodium: 142 mmol/L (ref 135–146)
Total Bilirubin: 0.7 mg/dL (ref 0.2–1.2)
Total Protein: 6.7 g/dL (ref 6.1–8.1)

## 2019-01-26 LAB — HEMOGLOBIN A1C
Hgb A1c MFr Bld: 5.2 % of total Hgb (ref <5.7)
Mean Plasma Glucose: 103 (calc)
eAG (mmol/L): 5.7 (calc)

## 2019-01-30 ENCOUNTER — Inpatient Hospital Stay: Admit: 2019-01-30 | Payer: 59 | Admitting: Neurosurgery

## 2019-01-30 SURGERY — TRANSFORAMINAL LUMBAR INTERBODY FUSION (TLIF) WITH PEDICLE SCREW FIXATION 1 LEVEL
Anesthesia: General | Laterality: Left

## 2019-02-28 ENCOUNTER — Ambulatory Visit (INDEPENDENT_AMBULATORY_CARE_PROVIDER_SITE_OTHER): Payer: PRIVATE HEALTH INSURANCE | Admitting: Vascular Surgery

## 2019-02-28 ENCOUNTER — Other Ambulatory Visit: Payer: Self-pay

## 2019-02-28 ENCOUNTER — Encounter: Payer: Self-pay | Admitting: Vascular Surgery

## 2019-02-28 VITALS — BP 124/84 | HR 59 | Temp 97.3°F | Resp 18 | Ht 67.0 in | Wt 197.0 lb

## 2019-02-28 DIAGNOSIS — M5137 Other intervertebral disc degeneration, lumbosacral region: Secondary | ICD-10-CM

## 2019-02-28 NOTE — Progress Notes (Signed)
Patient name: Rick Maldonado MRN: [id number] DOB: Jul 12, 1985 Sex: male  REASON FOR CONSULT: Evaluate for L5-S1 ALIF  HPI: Rick Maldonado is a 33 y.o. male, with history of obstructive sleep apnea that presents for preoperative evaluation of planned L5-S1 ALIF.  Patient states he has had several years of lower back pain with some radiculopathy in the left leg.  He has ultimately failed conservative management including physical therapy and multiple injections.  He is under the care of Dr. Newell Coral.  Ultimately it was recommended for a L5-S1 anterior approach.  Patient denies any history of abdominal surgery.  He works as a Emergency planning/management officer for [employer].  He scheduled for 03/13/2019.  Past Medical History:  Diagnosis Date  . Acid reflux   . Ankle pain   . Buttock pain   . DDD (degenerative disc disease), lumbosacral    bilateral neural foraminal stenosis at L5-S1, mass effect on B exiting L5 nerve roots (2/20)  . Dyslipidemia   . Hip pain   . Knee pain   . Leg pain   . Prediabetes   . Sleep apnea     Past Surgical History:  Procedure Laterality Date  . TONSILLECTOMY     as a child    Family History  Problem Relation Age of Onset  . Other Mother        drug addiction  . Arthritis Father     SOCIAL HISTORY: Social History   Socioeconomic History  . Marital status: Married    Spouse name: [name]  . Number of children: 0  . Years of education: BS  . Highest education level: Not on file  Occupational History  . Occupation: Nature conservation officer    Comment: [employer]  Tobacco Use  . Smoking status: Never Smoker  . Smokeless tobacco: Never Used  Substance and Sexual Activity  . Alcohol use: Yes    Comment: rarely  . Drug use: No  . Sexual activity: Not on file  Other Topics Concern  . Not on file  Social History Narrative   Patient consumes 2 glasses of caffeine daily,is right handed   Social Determinants of Health   Financial Resource Strain:   .  Difficulty of Paying Living Expenses: Not on file  Food Insecurity:   . Worried About Programme researcher, broadcasting/film/video in the Last Year: Not on file  . Ran Out of Food in the Last Year: Not on file  Transportation Needs:   . Lack of Transportation (Medical): Not on file  . Lack of Transportation (Non-Medical): Not on file  Physical Activity:   . Days of Exercise per Week: Not on file  . Minutes of Exercise per Session: Not on file  Stress:   . Feeling of Stress : Not on file  Social Connections:   . Frequency of Communication with Friends and Family: Not on file  . Frequency of Social Gatherings with Friends and Family: Not on file  . Attends Religious Services: Not on file  . Active Member of Clubs or Organizations: Not on file  . Attends Banker Meetings: Not on file  . Marital Status: Not on file  Intimate Partner Violence:   . Fear of Current or Ex-Partner: Not on file  . Emotionally Abused: Not on file  . Physically Abused: Not on file  . Sexually Abused: Not on file    Allergies  Allergen Reactions  . Codeine     Reaction unknown  . Morphine And  Related     Throat closing  . Penicillins     Reaction unknown  . Sulfa Antibiotics     Reaction unknown    Current Outpatient Medications  Medication Sig Dispense Refill  . gabapentin (NEURONTIN) 300 MG capsule Take 1 capsule (300 mg total) by mouth 3 (three) times daily. As needed for nerve pain 90 capsule 3  . UNABLE TO FIND CPAP for OSA    . diclofenac (VOLTAREN) 75 MG EC tablet Take 75 mg by mouth 2 (two) times daily.    Marland Kitchen tiZANidine (ZANAFLEX) 4 MG tablet Take 1 tablet (4 mg total) by mouth every 6 (six) hours as needed for muscle spasms. (Patient not taking: Reported on 02/28/2019) 30 tablet 0   No current facility-administered medications for this visit.    REVIEW OF SYSTEMS:  [X]  denotes positive finding, [ ]  denotes negative finding Cardiac  Comments:  Chest pain or chest pressure:    Shortness of breath  upon exertion:    Short of breath when lying flat:    Irregular heart rhythm:        Vascular    Pain in calf, thigh, or hip brought on by ambulation:    Pain in feet at night that wakes you up from your sleep:     Blood clot in your veins:    Leg swelling:         Pulmonary    Oxygen at home:    Productive cough:     Wheezing:         Neurologic    Sudden weakness in arms or legs:     Sudden numbness in arms or legs:     Sudden onset of difficulty speaking or slurred speech:    Temporary loss of vision in one eye:     Problems with dizziness:     Back pain x   Gastrointestinal    Blood in stool:     Vomited blood:         Genitourinary    Burning when urinating:     Blood in urine:        Psychiatric    Major depression:         Hematologic    Bleeding problems:    Problems with blood clotting too easily:        Skin    Rashes or ulcers:        Constitutional    Fever or chills:      PHYSICAL EXAM: Vitals:   02/28/19 0906 02/28/19 0909  BP:  124/84  Pulse:  (!) 59  Resp: 16 18  Temp:  (!) 97.3 F (36.3 C)  TempSrc:  Temporal  SpO2:  98%  Weight: 197 lb (89.4 kg) 197 lb (89.4 kg)  Height: 5\' 7"  (1.702 m) 5\' 7"  (1.702 m)    GENERAL: The patient is a well-nourished male, in no acute distress. The vital signs are documented above. CARDIAC: There is a regular rate and rhythm.  VASCULAR:  Palpable femoral pulses bilaterally Palpable DP pulses bilaterally  PULMONARY: There is good air exchange bilaterally without wheezing or rales. ABDOMEN: Soft and non-tender with normal pitched bowel sounds.  MUSCULOSKELETAL: There are no major deformities or cyanosis. NEUROLOGIC: No focal weakness or paresthesias are detected. SKIN: There are no ulcers or rashes noted. PSYCHIATRIC: The patient has a normal affect.  DATA:   I independently reviewed his CT L-spine from 01/17/2019 and his aortic bifurcation appears to be at L4  with the iliac vein bifurcation at L5.   No significant atherosclerotic disease.  Assessment/Plan:  33 year old male that presents for preop evaluation prior to planned L5-S1 ALIF.  I think he would be an excellent candidate for an anterior approach.  He has never had any abdominal surgery in the past.  I did review his CT lumbar spine imaging.  I discussed the steps of surgery with the patient's with left transverse incision over rectus muscle and retroperitoneal exposure of disc space as well as risk benefits.  We discussed risk of bleeding including iliac vein injury, injury to the left rectus muscle and/or intestines/ ureter and risk of retrograde ejaculation.  I look forward to assisting Dr. Newell CoralNudelman and Dr. Danielle DessElsner.   Cephus Shellinghristopher J. Nairi Oswald, MD [facility] [place] Office: [phone] Pager: [phone]

## 2019-03-07 ENCOUNTER — Other Ambulatory Visit: Payer: Self-pay | Admitting: Neurosurgery

## 2019-03-08 NOTE — Progress Notes (Signed)
[id number] [place], [place] - [address] [place] [address] Phone: [phone] Fax: [phone]      Your procedure is scheduled on March 13, 2019.  Report to [facility] Main Entrance "A" at 5:30 A.M., and check in at the Admitting office.  Call this number if you have problems the morning of surgery:  [phone]  Call [phone] if you have any questions prior to your surgery date Monday-Friday 8am-4pm    Remember:  Do not eat or drink after midnight the night before your surgery   Take these medicines the morning of surgery with A SIP OF WATER: gabapentin (NEURONTIN)   As of today, STOP taking any Aspirin (unless otherwise instructed by your surgeon), Aleve, Naproxen, Ibuprofen, Motrin, Advil, Goody's, BC's, all herbal medications, fish oil, and all vitamins.    The Morning of Surgery  Do not wear jewelry.  Do not wear lotions, powders, colognes, or deodorant    Men may shave face and neck.  Do not bring valuables to the hospital.  [facility] is not responsible for any belongings or valuables.  If you are a smoker, DO NOT Smoke 24 hours prior to surgery  If you wear a CPAP at night please bring your mask, tubing, and machine the morning of surgery   Remember that you must have someone to transport you home after your surgery, and remain with you for 24 hours if you are discharged the same day.   Please bring cases for contacts, glasses, hearing aids, dentures or bridgework because it cannot be worn into surgery.    Leave your suitcase in the car.  After surgery it may be brought to your room.  For patients admitted to the hospital, discharge time will be determined by your treatment team.  Patients discharged the day of surgery will not be allowed to drive home.    Special instructions:   [facility]- Preparing For Surgery  Before surgery, you can play an important role. Because skin  is not sterile, your skin needs to be as free of germs as possible. You can reduce the number of germs on your skin by washing with CHG (chlorahexidine gluconate) Soap before surgery.  CHG is an antiseptic cleaner which kills germs and bonds with the skin to continue killing germs even after washing.    Oral Hygiene is also important to reduce your risk of infection.  Remember - BRUSH YOUR TEETH THE MORNING OF SURGERY WITH YOUR REGULAR TOOTHPASTE  Please do not use if you have an allergy to CHG or antibacterial soaps. If your skin becomes reddened/irritated stop using the CHG.  Do not shave (including legs and underarms) for at least 48 hours prior to first CHG shower. It is OK to shave your face.  Please follow these instructions carefully.   1. Shower the NIGHT BEFORE SURGERY and the MORNING OF SURGERY with CHG Soap.   2. If you chose to wash your hair, wash your hair first as usual with your normal shampoo.  3. After you shampoo, rinse your hair and body thoroughly to remove the shampoo.  4. Use CHG as you would any other liquid soap. You can apply CHG directly to the skin and wash gently with a scrungie or a clean washcloth.   5. Apply the CHG Soap to your body ONLY FROM THE NECK DOWN.  Do not use on open wounds or open sores. Avoid contact with your  eyes, ears, mouth and genitals (private parts). Wash Face and genitals (private parts)  with your normal soap.   6. Wash thoroughly, paying special attention to the area where your surgery will be performed.  7. Thoroughly rinse your body with warm water from the neck down.  8. DO NOT shower/wash with your normal soap after using and rinsing off the CHG Soap.  9. Pat yourself dry with a CLEAN TOWEL.  10. Wear CLEAN PAJAMAS to bed the night before surgery, wear comfortable clothes the morning of surgery  11. Place CLEAN SHEETS on your bed the night of your first shower and DO NOT SLEEP WITH PETS.    Day of Surgery:  Please shower  the morning of surgery with the CHG soap Do not apply any deodorants/lotions. Please wear clean clothes to the hospital/surgery center.   Remember to brush your teeth WITH YOUR REGULAR TOOTHPASTE.   Please read over the following fact sheets that you were given.

## 2019-03-09 ENCOUNTER — Encounter (HOSPITAL_COMMUNITY): Payer: Self-pay

## 2019-03-09 ENCOUNTER — Encounter (HOSPITAL_COMMUNITY)
Admission: RE | Admit: 2019-03-09 | Discharge: 2019-03-09 | Disposition: A | Discharge status: Home / Self Care | Payer: 59 | Source: Ambulatory Visit | Attending: Neurosurgery | Admitting: Neurosurgery

## 2019-03-09 ENCOUNTER — Other Ambulatory Visit: Payer: Self-pay

## 2019-03-09 ENCOUNTER — Other Ambulatory Visit (HOSPITAL_COMMUNITY)
Admission: RE | Admit: 2019-03-09 | Discharge: 2019-03-09 | Disposition: A | Discharge status: Home / Self Care | Payer: 59 | Source: Ambulatory Visit | Attending: Neurosurgery | Admitting: Neurosurgery

## 2019-03-09 DIAGNOSIS — Z20822 Contact with and (suspected) exposure to covid-19: Secondary | ICD-10-CM | POA: Diagnosis not present

## 2019-03-09 DIAGNOSIS — Z01812 Encounter for preprocedural laboratory examination: Secondary | ICD-10-CM | POA: Insufficient documentation

## 2019-03-09 HISTORY — DX: Spondylolysis, site unspecified: M43.00

## 2019-03-09 LAB — SURGICAL PCR SCREEN
MRSA, PCR: NEGATIVE
Staphylococcus aureus: NEGATIVE

## 2019-03-09 LAB — CBC
HCT: 48.6 % (ref 39.0–52.0)
Hemoglobin: 16.5 g/dL (ref 13.0–17.0)
MCH: 31.6 pg (ref 26.0–34.0)
MCHC: 34 g/dL (ref 30.0–36.0)
MCV: 93.1 fL (ref 80.0–100.0)
Platelets: 196 10*3/uL (ref 150–400)
RBC: 5.22 MIL/uL (ref 4.22–5.81)
RDW: 11.8 % (ref 11.5–15.5)
WBC: 7.4 10*3/uL (ref 4.0–10.5)
nRBC: 0 % (ref 0.0–0.2)

## 2019-03-09 LAB — BASIC METABOLIC PANEL
Anion gap: 9 (ref 5–15)
BUN: 17 mg/dL (ref 6–20)
CO2: 23 mmol/L (ref 22–32)
Calcium: 9.4 mg/dL (ref 8.9–10.3)
Chloride: 105 mmol/L (ref 98–111)
Creatinine, Ser: 1.19 mg/dL (ref 0.61–1.24)
GFR calc Af Amer: >60 mL/min (ref >60)
GFR calc non Af Amer: >60 mL/min (ref >60)
Glucose, Bld: 88 mg/dL (ref 70–99)
Potassium: 4 mmol/L (ref 3.5–5.1)
Sodium: 137 mmol/L (ref 135–145)

## 2019-03-09 LAB — TYPE AND SCREEN
ABO/RH(D): O NEG
Antibody Screen: NEGATIVE

## 2019-03-09 LAB — ABO/RH: ABO/RH(D): O NEG

## 2019-03-09 MED ORDER — CHLORHEXIDINE GLUCONATE CLOTH 2 % EX PADS: 6.0000 | MEDICATED_PAD | Freq: Once | CUTANEOUS | Status: DC

## 2019-03-11 LAB — NOVEL CORONAVIRUS, NAA (HOSP ORDER, SEND-OUT TO REF LAB; TAT 18-24 HRS): SARS-CoV-2, NAA: NOT DETECTED

## 2019-03-12 NOTE — Anesthesia Preprocedure Evaluation (Addendum)
Anesthesia Evaluation  Patient identified by MRN, date of birth, ID band Patient awake    Reviewed: Allergy & Precautions, NPO status , Patient's Chart, lab work & pertinent test results  History of Anesthesia Complications Negative for: history of anesthetic complications  Airway Mallampati: III  TM Distance: >3 FB Neck ROM: Full    Dental  (+) Dental Advisory Given, Teeth Intact, Caps   Pulmonary asthma (childhood, resolved) , sleep apnea and Continuous Positive Airway Pressure Ventilation ,    Pulmonary exam normal        Cardiovascular (-) anginanegative cardio ROS Normal cardiovascular exam     Neuro/Psych negative neurological ROS  negative psych ROS   GI/Hepatic Neg liver ROS, GERD  Controlled,  Endo/Other   Obesity Pre-DM   Renal/GU negative Renal ROS     Musculoskeletal  (+) Arthritis ,   Abdominal   Peds  Hematology negative hematology ROS (+)   Anesthesia Other Findings Covid neg 1/7   Reproductive/Obstetrics                            Anesthesia Physical Anesthesia Plan  ASA: II  Anesthesia Plan: General   Post-op Pain Management:    Induction: Intravenous  PONV Risk Score and Plan: 3 and Treatment may vary due to age or medical condition, Ondansetron, Dexamethasone, Midazolam and Scopolamine patch - Pre-op  Airway Management Planned: Oral ETT  Additional Equipment: Arterial line  Intra-op Plan:   Post-operative Plan: Extubation in OR  Informed Consent: I have reviewed the patients History and Physical, chart, labs and discussed the procedure including the risks, benefits and alternatives for the proposed anesthesia with the patient or authorized representative who has indicated his/her understanding and acceptance.     Dental advisory given  Plan Discussed with: CRNA and Anesthesiologist  Anesthesia Plan Comments: (Arterial line given prolonged length  of procedure. 2 good peripheral IVs)       Anesthesia Quick Evaluation

## 2019-03-13 ENCOUNTER — Inpatient Hospital Stay (HOSPITAL_COMMUNITY): Payer: 59

## 2019-03-13 ENCOUNTER — Other Ambulatory Visit: Payer: Self-pay

## 2019-03-13 ENCOUNTER — Inpatient Hospital Stay (HOSPITAL_COMMUNITY): Payer: 59 | Admitting: Anesthesiology

## 2019-03-13 ENCOUNTER — Encounter (HOSPITAL_COMMUNITY)
Admission: RE | Disposition: A | Discharge status: Home / Self Care | Payer: Self-pay | Source: Home / Self Care | Attending: Neurosurgery

## 2019-03-13 ENCOUNTER — Encounter (HOSPITAL_COMMUNITY): Payer: Self-pay | Admitting: Neurosurgery

## 2019-03-13 ENCOUNTER — Inpatient Hospital Stay (HOSPITAL_COMMUNITY)
Admission: RE | Admit: 2019-03-13 | Discharge: 2019-03-14 | DRG: 455 | Disposition: A | Discharge status: Home / Self Care | Payer: 59 | Attending: Neurosurgery | Admitting: Neurosurgery

## 2019-03-13 DIAGNOSIS — M4316 Spondylolisthesis, lumbar region: Secondary | ICD-10-CM | POA: Diagnosis present

## 2019-03-13 DIAGNOSIS — G4733 Obstructive sleep apnea (adult) (pediatric): Secondary | ICD-10-CM | POA: Diagnosis present

## 2019-03-13 DIAGNOSIS — Z9089 Acquired absence of other organs: Secondary | ICD-10-CM | POA: Diagnosis not present

## 2019-03-13 DIAGNOSIS — M5417 Radiculopathy, lumbosacral region: Secondary | ICD-10-CM | POA: Diagnosis present

## 2019-03-13 DIAGNOSIS — K219 Gastro-esophageal reflux disease without esophagitis: Secondary | ICD-10-CM | POA: Diagnosis present

## 2019-03-13 DIAGNOSIS — Z8261 Family history of arthritis: Secondary | ICD-10-CM

## 2019-03-13 DIAGNOSIS — Z882 Allergy status to sulfonamides status: Secondary | ICD-10-CM

## 2019-03-13 DIAGNOSIS — Z419 Encounter for procedure for purposes other than remedying health state, unspecified: Secondary | ICD-10-CM

## 2019-03-13 DIAGNOSIS — M4317 Spondylolisthesis, lumbosacral region: Principal | ICD-10-CM | POA: Diagnosis present

## 2019-03-13 DIAGNOSIS — M545 Low back pain: Secondary | ICD-10-CM

## 2019-03-13 DIAGNOSIS — Z885 Allergy status to narcotic agent status: Secondary | ICD-10-CM | POA: Diagnosis not present

## 2019-03-13 DIAGNOSIS — Z880 Allergy status to penicillin: Secondary | ICD-10-CM | POA: Diagnosis not present

## 2019-03-13 DIAGNOSIS — M5416 Radiculopathy, lumbar region: Secondary | ICD-10-CM

## 2019-03-13 HISTORY — PX: LUMBAR PERCUTANEOUS PEDICLE SCREW 1 LEVEL: SHX5560

## 2019-03-13 HISTORY — PX: ABDOMINAL EXPOSURE: SHX5708

## 2019-03-13 HISTORY — PX: ANTERIOR LUMBAR FUSION: SHX1170

## 2019-03-13 HISTORY — PX: APPLICATION OF ROBOTIC ASSISTANCE FOR SPINAL PROCEDURE: SHX6753

## 2019-03-13 SURGERY — ANTERIOR LUMBAR FUSION 1 LEVEL
Anesthesia: General | Site: Spine Lumbar

## 2019-03-13 MED ORDER — MENTHOL 3 MG MT LOZG: 1.0000 | LOZENGE | OROMUCOSAL | Status: DC | PRN

## 2019-03-13 MED ORDER — ROCURONIUM BROMIDE 10 MG/ML (PF) SYRINGE
PREFILLED_SYRINGE | INTRAVENOUS | Status: AC
  Filled 2019-03-13: qty 50

## 2019-03-13 MED ORDER — LIDOCAINE 2% (20 MG/ML) 5 ML SYRINGE
INTRAMUSCULAR | Status: AC
  Filled 2019-03-13: qty 5

## 2019-03-13 MED ORDER — ACETAMINOPHEN 650 MG RE SUPP: 650.0000 mg | RECTAL | Status: DC | PRN

## 2019-03-13 MED ORDER — PROPOFOL 10 MG/ML IV BOLUS
INTRAVENOUS | Status: DC | PRN
  Administered 2019-03-13: 100 mg via INTRAVENOUS
  Administered 2019-03-13: 20 mg via INTRAVENOUS

## 2019-03-13 MED ORDER — DEXAMETHASONE SODIUM PHOSPHATE 10 MG/ML IJ SOLN
INTRAMUSCULAR | Status: AC
  Filled 2019-03-13: qty 1

## 2019-03-13 MED ORDER — FENTANYL CITRATE (PF) 100 MCG/2ML IJ SOLN
INTRAMUSCULAR | Status: AC
  Filled 2019-03-13: qty 2

## 2019-03-13 MED ORDER — ROCURONIUM BROMIDE 10 MG/ML (PF) SYRINGE
PREFILLED_SYRINGE | INTRAVENOUS | Status: DC | PRN
  Administered 2019-03-13: 10 mg via INTRAVENOUS
  Administered 2019-03-13 (×2): 20 mg via INTRAVENOUS
  Administered 2019-03-13: 100 mg via INTRAVENOUS

## 2019-03-13 MED ORDER — PROMETHAZINE HCL 25 MG/ML IJ SOLN: 6.2500 mg | INTRAMUSCULAR | Status: DC | PRN

## 2019-03-13 MED ORDER — HYDROMORPHONE HCL 1 MG/ML IJ SOLN
INTRAMUSCULAR | Status: AC
  Filled 2019-03-13: qty 0.5

## 2019-03-13 MED ORDER — ARTIFICIAL TEARS OPHTHALMIC OINT
TOPICAL_OINTMENT | OPHTHALMIC | Status: DC | PRN
  Administered 2019-03-13: 1 via OPHTHALMIC

## 2019-03-13 MED ORDER — ONDANSETRON HCL 4 MG/2ML IJ SOLN: 4.0000 mg | Freq: Four times a day (QID) | INTRAMUSCULAR | Status: DC | PRN

## 2019-03-13 MED ORDER — HYDROCODONE-ACETAMINOPHEN 5-325 MG PO TABS
1.0000 | ORAL_TABLET | ORAL | Status: DC | PRN
  Administered 2019-03-13: 21:00:00 1 via ORAL
  Administered 2019-03-13 – 2019-03-14 (×5): 2 via ORAL
  Filled 2019-03-13 (×6): qty 2

## 2019-03-13 MED ORDER — KETOROLAC TROMETHAMINE 30 MG/ML IJ SOLN
30.0000 mg | Freq: Once | INTRAMUSCULAR | Status: AC
  Administered 2019-03-13: 14:00:00 30 mg via INTRAVENOUS

## 2019-03-13 MED ORDER — ONDANSETRON HCL 4 MG/2ML IJ SOLN
INTRAMUSCULAR | Status: DC | PRN
  Administered 2019-03-13: 4 mg via INTRAVENOUS

## 2019-03-13 MED ORDER — MAGNESIUM CITRATE PO SOLN
1.0000 | Freq: Once | ORAL | Status: AC
  Administered 2019-03-13: 18:00:00 1 via ORAL
  Filled 2019-03-13: qty 296

## 2019-03-13 MED ORDER — LIDOCAINE-EPINEPHRINE 1 %-1:100000 IJ SOLN
INTRAMUSCULAR | Status: DC | PRN
  Administered 2019-03-13: 15 mL

## 2019-03-13 MED ORDER — BUPIVACAINE HCL (PF) 0.5 % IJ SOLN
INTRAMUSCULAR | Status: AC
  Filled 2019-03-13: qty 30

## 2019-03-13 MED ORDER — LACTATED RINGERS IV SOLN: INTRAVENOUS | Status: DC | PRN

## 2019-03-13 MED ORDER — KETAMINE HCL 10 MG/ML IJ SOLN
INTRAMUSCULAR | Status: DC | PRN
  Administered 2019-03-13 (×2): 10 mg via INTRAVENOUS

## 2019-03-13 MED ORDER — ACETAMINOPHEN 10 MG/ML IV SOLN
INTRAVENOUS | Status: DC | PRN
  Administered 2019-03-13: 1000 mg via INTRAVENOUS

## 2019-03-13 MED ORDER — KCL IN DEXTROSE-NACL 20-5-0.45 MEQ/L-%-% IV SOLN: INTRAVENOUS | Status: DC

## 2019-03-13 MED ORDER — SODIUM CHLORIDE 0.9 % IV SOLN
INTRAVENOUS | Status: DC | PRN
  Administered 2019-03-13: 500 mL

## 2019-03-13 MED ORDER — ONDANSETRON HCL 4 MG/2ML IJ SOLN
INTRAMUSCULAR | Status: AC
  Filled 2019-03-13: qty 2

## 2019-03-13 MED ORDER — FENTANYL CITRATE (PF) 100 MCG/2ML IJ SOLN
INTRAMUSCULAR | Status: DC | PRN
  Administered 2019-03-13 (×3): 50 ug via INTRAVENOUS

## 2019-03-13 MED ORDER — LIDOCAINE-EPINEPHRINE 1 %-1:100000 IJ SOLN
INTRAMUSCULAR | Status: AC
  Filled 2019-03-13: qty 1

## 2019-03-13 MED ORDER — ALBUMIN HUMAN 5 % IV SOLN: INTRAVENOUS | Status: DC | PRN

## 2019-03-13 MED ORDER — DOCUSATE SODIUM 100 MG PO CAPS
100.0000 mg | ORAL_CAPSULE | Freq: Two times a day (BID) | ORAL | Status: DC
  Administered 2019-03-13 – 2019-03-14 (×2): 100 mg via ORAL
  Filled 2019-03-13 (×2): qty 1

## 2019-03-13 MED ORDER — THROMBIN 5000 UNITS EX SOLR
CUTANEOUS | Status: AC
  Filled 2019-03-13: qty 5000

## 2019-03-13 MED ORDER — ACETAMINOPHEN 10 MG/ML IV SOLN
INTRAVENOUS | Status: AC
  Filled 2019-03-13: qty 100

## 2019-03-13 MED ORDER — BISACODYL 10 MG RE SUPP: 10.0000 mg | Freq: Every day | RECTAL | Status: DC | PRN

## 2019-03-13 MED ORDER — FENTANYL CITRATE (PF) 250 MCG/5ML IJ SOLN
INTRAMUSCULAR | Status: AC
  Filled 2019-03-13: qty 5

## 2019-03-13 MED ORDER — MIDAZOLAM HCL 5 MG/5ML IJ SOLN
INTRAMUSCULAR | Status: DC | PRN
  Administered 2019-03-13: 2 mg via INTRAVENOUS

## 2019-03-13 MED ORDER — EPINEPHRINE PF 1 MG/ML IJ SOLN: 0.3000 mg | Freq: Once | INTRAMUSCULAR | Status: DC | PRN

## 2019-03-13 MED ORDER — PHENOL 1.4 % MT LIQD: 1.0000 | OROMUCOSAL | Status: DC | PRN

## 2019-03-13 MED ORDER — KETOROLAC TROMETHAMINE 30 MG/ML IJ SOLN
30.0000 mg | Freq: Four times a day (QID) | INTRAMUSCULAR | Status: DC
  Administered 2019-03-13 – 2019-03-14 (×4): 30 mg via INTRAVENOUS
  Filled 2019-03-13 (×4): qty 1

## 2019-03-13 MED ORDER — ONDANSETRON HCL 4 MG PO TABS: 4.0000 mg | ORAL_TABLET | Freq: Four times a day (QID) | ORAL | Status: DC | PRN

## 2019-03-13 MED ORDER — ALUM & MAG HYDROXIDE-SIMETH 200-200-20 MG/5ML PO SUSP: 30.0000 mL | Freq: Four times a day (QID) | ORAL | Status: DC | PRN

## 2019-03-13 MED ORDER — HYDROXYZINE HCL 25 MG PO TABS: 50.0000 mg | ORAL_TABLET | ORAL | Status: DC | PRN

## 2019-03-13 MED ORDER — BUPIVACAINE HCL (PF) 0.5 % IJ SOLN
INTRAMUSCULAR | Status: DC | PRN
  Administered 2019-03-13: 15 mL

## 2019-03-13 MED ORDER — SODIUM CHLORIDE 0.9% FLUSH
3.0000 mL | Freq: Two times a day (BID) | INTRAVENOUS | Status: DC
  Administered 2019-03-13 – 2019-03-14 (×3): 3 mL via INTRAVENOUS

## 2019-03-13 MED ORDER — FLEET ENEMA 7-19 GM/118ML RE ENEM: 1.0000 | ENEMA | Freq: Once | RECTAL | Status: DC | PRN

## 2019-03-13 MED ORDER — CYCLOBENZAPRINE HCL 5 MG PO TABS
5.0000 mg | ORAL_TABLET | Freq: Three times a day (TID) | ORAL | Status: DC | PRN
  Administered 2019-03-13: 18:00:00 10 mg via ORAL
  Administered 2019-03-14: 03:00:00 5 mg via ORAL
  Filled 2019-03-13 (×2): qty 2

## 2019-03-13 MED ORDER — PHENYLEPHRINE 40 MCG/ML (10ML) SYRINGE FOR IV PUSH (FOR BLOOD PRESSURE SUPPORT)
PREFILLED_SYRINGE | INTRAVENOUS | Status: AC
  Filled 2019-03-13: qty 20

## 2019-03-13 MED ORDER — HYDROXYZINE HCL 50 MG/ML IM SOLN: 50.0000 mg | INTRAMUSCULAR | Status: DC | PRN

## 2019-03-13 MED ORDER — CHLORHEXIDINE GLUCONATE 4 % EX LIQD: 60.0000 mL | Freq: Once | CUTANEOUS | Status: DC

## 2019-03-13 MED ORDER — 0.9 % SODIUM CHLORIDE (POUR BTL) OPTIME
TOPICAL | Status: DC | PRN
  Administered 2019-03-13 (×2): 1000 mL

## 2019-03-13 MED ORDER — MIDAZOLAM HCL 2 MG/2ML IJ SOLN
INTRAMUSCULAR | Status: AC
  Filled 2019-03-13: qty 2

## 2019-03-13 MED ORDER — PROPOFOL 10 MG/ML IV BOLUS
INTRAVENOUS | Status: AC
  Filled 2019-03-13: qty 40

## 2019-03-13 MED ORDER — VANCOMYCIN HCL IN DEXTROSE 1-5 GM/200ML-% IV SOLN
1000.0000 mg | INTRAVENOUS | Status: AC
  Administered 2019-03-13: 1000 mg via INTRAVENOUS

## 2019-03-13 MED ORDER — VANCOMYCIN HCL IN DEXTROSE 1-5 GM/200ML-% IV SOLN
INTRAVENOUS | Status: AC
  Filled 2019-03-13: qty 200

## 2019-03-13 MED ORDER — GENTAMICIN IN SALINE 1.6-0.9 MG/ML-% IV SOLN
80.0000 mg | INTRAVENOUS | Status: AC
  Administered 2019-03-13: 80 mg via INTRAVENOUS
  Filled 2019-03-13: qty 50

## 2019-03-13 MED ORDER — THROMBIN 20000 UNITS EX SOLR
CUTANEOUS | Status: DC | PRN
  Administered 2019-03-13: 20 mL via TOPICAL

## 2019-03-13 MED ORDER — ACETAMINOPHEN 325 MG PO TABS: 650.0000 mg | ORAL_TABLET | ORAL | Status: DC | PRN

## 2019-03-13 MED ORDER — LIDOCAINE 2% (20 MG/ML) 5 ML SYRINGE
INTRAMUSCULAR | Status: DC | PRN
  Administered 2019-03-13: 60 mg via INTRAVENOUS

## 2019-03-13 MED ORDER — FENTANYL CITRATE (PF) 100 MCG/2ML IJ SOLN
25.0000 ug | INTRAMUSCULAR | Status: DC | PRN
  Administered 2019-03-13 (×4): 25 ug via INTRAVENOUS

## 2019-03-13 MED ORDER — GABAPENTIN 300 MG PO CAPS
300.0000 mg | ORAL_CAPSULE | Freq: Three times a day (TID) | ORAL | Status: DC | PRN
  Administered 2019-03-13: 17:00:00 300 mg via ORAL
  Filled 2019-03-13: qty 1

## 2019-03-13 MED ORDER — MAGNESIUM HYDROXIDE 400 MG/5ML PO SUSP: 30.0000 mL | Freq: Every day | ORAL | Status: DC | PRN

## 2019-03-13 MED ORDER — KETAMINE HCL 50 MG/5ML IJ SOSY
PREFILLED_SYRINGE | INTRAMUSCULAR | Status: AC
  Filled 2019-03-13: qty 5

## 2019-03-13 MED ORDER — KETOROLAC TROMETHAMINE 30 MG/ML IJ SOLN
INTRAMUSCULAR | Status: AC
  Filled 2019-03-13: qty 1

## 2019-03-13 MED ORDER — SUGAMMADEX SODIUM 200 MG/2ML IV SOLN
INTRAVENOUS | Status: DC | PRN
  Administered 2019-03-13: 200 mg via INTRAVENOUS

## 2019-03-13 MED ORDER — THROMBIN 20000 UNITS EX SOLR
CUTANEOUS | Status: AC
  Filled 2019-03-13: qty 20000

## 2019-03-13 MED ORDER — THROMBIN 5000 UNITS EX SOLR
OROMUCOSAL | Status: DC | PRN
  Administered 2019-03-13: 09:00:00 5 mL via TOPICAL

## 2019-03-13 MED ORDER — DEXMEDETOMIDINE HCL IN NACL 200 MCG/50ML IV SOLN
INTRAVENOUS | Status: DC | PRN
  Administered 2019-03-13: 8 ug via INTRAVENOUS
  Administered 2019-03-13: 12 ug via INTRAVENOUS

## 2019-03-13 MED ORDER — HYDROMORPHONE HCL 1 MG/ML IJ SOLN: 2.0000 mg | INTRAMUSCULAR | Status: DC | PRN

## 2019-03-13 MED ORDER — DEXAMETHASONE SODIUM PHOSPHATE 10 MG/ML IJ SOLN
INTRAMUSCULAR | Status: DC | PRN
  Administered 2019-03-13: 10 mg via INTRAVENOUS

## 2019-03-13 MED ORDER — DEXMEDETOMIDINE HCL IN NACL 80 MCG/20ML IV SOLN
INTRAVENOUS | Status: AC
  Filled 2019-03-13: qty 20

## 2019-03-13 MED ORDER — SODIUM CHLORIDE 0.9 % IV SOLN
250.0000 mL | INTRAVENOUS | Status: DC
  Administered 2019-03-13: 18:00:00 250 mL via INTRAVENOUS

## 2019-03-13 MED ORDER — SODIUM CHLORIDE 0.9% FLUSH: 3.0000 mL | INTRAVENOUS | Status: DC | PRN

## 2019-03-13 MED ORDER — HYDROMORPHONE HCL 1 MG/ML IJ SOLN
INTRAMUSCULAR | Status: DC | PRN
  Administered 2019-03-13 (×3): .25 mg via INTRAVENOUS

## 2019-03-13 MED ORDER — EPINEPHRINE 0.3 MG/0.3ML IJ SOAJ
0.3000 mg | Freq: Once | INTRAMUSCULAR | Status: DC | PRN
  Filled 2019-03-13: qty 0.6

## 2019-03-13 SURGICAL SUPPLY — 121 items
ADH SKN CLS APL DERMABOND .7 (GAUZE/BANDAGES/DRESSINGS) ×2
APL SKNCLS STERI-STRIP NONHPOA (GAUZE/BANDAGES/DRESSINGS)
APPLIER CLIP 11 MED OPEN (CLIP) ×2
APR CLP MED 11 20 MLT OPN (CLIP) ×1
BAG DECANTER FOR FLEXI CONT (MISCELLANEOUS) ×2 IMPLANT
BENZOIN TINCTURE PRP APPL 2/3 (GAUZE/BANDAGES/DRESSINGS) ×1 IMPLANT
BIT DRILL LONG 3.0X30 (BIT) ×1 IMPLANT
BIT DRILL LONG 3X80 (BIT) IMPLANT
BIT DRILL LONG 4X80 (BIT) IMPLANT
BIT DRILL SHORT 3.0X30 (BIT) IMPLANT
BIT DRILL SHORT 3X80 (BIT) IMPLANT
BLADE CLIPPER SURG (BLADE) ×2 IMPLANT
BLADE SURG 11 STRL SS (BLADE) ×1 IMPLANT
CAGE NANOLOCK LG 14 12D (Cage) ×1 IMPLANT
CARTRIDGE OIL MAESTRO DRILL (MISCELLANEOUS) ×1 IMPLANT
CLIP APPLIE 11 MED OPEN (CLIP) ×1 IMPLANT
CLIP LIGATING EXTRA MED SLVR (CLIP) ×1 IMPLANT
CLIP LIGATING EXTRA SM BLUE (MISCELLANEOUS) ×1 IMPLANT
CONT SPEC 4OZ CLIKSEAL STRL BL (MISCELLANEOUS) ×2 IMPLANT
COVER BACK TABLE 24X17X13 BIG (DRAPES) IMPLANT
COVER BACK TABLE 60X90IN (DRAPES) ×2 IMPLANT
COVER WAND RF STERILE (DRAPES) ×5 IMPLANT
DECANTER SPIKE VIAL GLASS SM (MISCELLANEOUS) ×2 IMPLANT
DERMABOND ADVANCED (GAUZE/BANDAGES/DRESSINGS) ×2
DERMABOND ADVANCED .7 DNX12 (GAUZE/BANDAGES/DRESSINGS) ×2 IMPLANT
DIFFUSER DRILL AIR PNEUMATIC (MISCELLANEOUS) ×2 IMPLANT
DRAPE C-ARM 42X72 X-RAY (DRAPES) ×3 IMPLANT
DRAPE C-ARMOR (DRAPES) ×3 IMPLANT
DRAPE LAPAROTOMY 100X72X124 (DRAPES) ×3 IMPLANT
DRAPE SHEET LG 3/4 BI-LAMINATE (DRAPES) ×2 IMPLANT
ELECT BLADE 4.0 EZ CLEAN MEGAD (MISCELLANEOUS) ×2
ELECT REM PT RETURN 9FT ADLT (ELECTROSURGICAL) ×4
ELECTRODE BLDE 4.0 EZ CLN MEGD (MISCELLANEOUS) ×1 IMPLANT
ELECTRODE REM PT RTRN 9FT ADLT (ELECTROSURGICAL) ×1 IMPLANT
EXTENDER TAB GUIDE SV 5.5/6.0 (INSTRUMENTS) ×8 IMPLANT
GAUZE 4X4 16PLY RFD (DISPOSABLE) IMPLANT
GAUZE SPONGE 4X4 12PLY STRL (GAUZE/BANDAGES/DRESSINGS) ×2 IMPLANT
GAUZE SPONGE 4X4 12PLY STRL LF (GAUZE/BANDAGES/DRESSINGS) ×2 IMPLANT
GLOVE BIO SURGEON STRL SZ7.5 (GLOVE) ×2 IMPLANT
GLOVE BIOGEL PI IND STRL 7.0 (GLOVE) IMPLANT
GLOVE BIOGEL PI IND STRL 7.5 (GLOVE) IMPLANT
GLOVE BIOGEL PI IND STRL 8 (GLOVE) ×2 IMPLANT
GLOVE BIOGEL PI IND STRL 8.5 (GLOVE) IMPLANT
GLOVE BIOGEL PI INDICATOR 7.0 (GLOVE) ×3
GLOVE BIOGEL PI INDICATOR 7.5 (GLOVE) ×3
GLOVE BIOGEL PI INDICATOR 8 (GLOVE) ×3
GLOVE BIOGEL PI INDICATOR 8.5 (GLOVE) ×1
GLOVE ECLIPSE 7.5 STRL STRAW (GLOVE) ×4 IMPLANT
GLOVE ECLIPSE 8.5 STRL (GLOVE) ×1 IMPLANT
GLOVE EXAM NITRILE XL STR (GLOVE) IMPLANT
GLOVE SS BIOGEL STRL SZ 7.5 (GLOVE) ×1 IMPLANT
GLOVE SUPERSENSE BIOGEL SZ 7.5 (GLOVE) ×1
GLOVE SURG SS PI 7.5 STRL IVOR (GLOVE) ×5 IMPLANT
GOWN STRL REUS W/ TWL LRG LVL3 (GOWN DISPOSABLE) IMPLANT
GOWN STRL REUS W/ TWL XL LVL3 (GOWN DISPOSABLE) ×2 IMPLANT
GOWN STRL REUS W/TWL 2XL LVL3 (GOWN DISPOSABLE) ×1 IMPLANT
GOWN STRL REUS W/TWL LRG LVL3 (GOWN DISPOSABLE) ×2
GOWN STRL REUS W/TWL XL LVL3 (GOWN DISPOSABLE) ×6
GUIDEWIRE BLUNT NT 450 (WIRE) ×4 IMPLANT
HEMOSTAT POWDER KIT SURGIFOAM (HEMOSTASIS) ×1 IMPLANT
HEMOSTAT SNOW SURGICEL 2X4 (HEMOSTASIS) IMPLANT
INSERT FOGARTY 61MM (MISCELLANEOUS) IMPLANT
INSERT FOGARTY SM (MISCELLANEOUS) IMPLANT
KIT BASIN OR (CUSTOM PROCEDURE TRAY) ×3 IMPLANT
KIT INFUSE XX SMALL 0.7CC (Orthopedic Implant) ×1 IMPLANT
KIT POSITION SURG JACKSON T1 (MISCELLANEOUS) ×1 IMPLANT
KIT SPINE MAZOR X ROBO DISP (MISCELLANEOUS) ×2 IMPLANT
KIT TURNOVER KIT B (KITS) ×3 IMPLANT
LOOP VESSEL MAXI BLUE (MISCELLANEOUS) IMPLANT
LOOP VESSEL MINI RED (MISCELLANEOUS) IMPLANT
MARKER SKIN DUAL TIP RULER LAB (MISCELLANEOUS) ×3 IMPLANT
NDL SPNL 22GX3.5 QUINCKE BK (NEEDLE) ×1 IMPLANT
NEEDLE SPNL 22GX3.5 QUINCKE BK (NEEDLE) ×2 IMPLANT
NS IRRIG 1000ML POUR BTL (IV SOLUTION) ×3 IMPLANT
OIL CARTRIDGE MAESTRO DRILL (MISCELLANEOUS) ×2
PACK LAMINECTOMY NEURO (CUSTOM PROCEDURE TRAY) ×3 IMPLANT
PAD ARMBOARD 7.5X6 YLW CONV (MISCELLANEOUS) ×10 IMPLANT
PATTIES SURGICAL .5 X.5 (GAUZE/BANDAGES/DRESSINGS) IMPLANT
PATTIES SURGICAL .5 X1 (DISPOSABLE) ×1 IMPLANT
PATTIES SURGICAL 1X1 (DISPOSABLE) IMPLANT
PIN HEAD 2.5X60MM (PIN) IMPLANT
ROD 5.5 CCM PERC 40 (Rod) ×2 IMPLANT
SCREW .5X25 (Screw) ×3 IMPLANT
SCREW 6.5X40 VOYAGER MAS FNS (Screw) ×2 IMPLANT
SCREW MAS FENS 6.5 45 (Screw) IMPLANT
SCREW MAS FENS 6.5X45 (Screw) ×4 IMPLANT
SCREW SCHANZ SA 4.0MM (MISCELLANEOUS) IMPLANT
SCREW SET 5.5/6.0MM SOLERA (Screw) ×4 IMPLANT
SPONGE INTESTINAL PEANUT (DISPOSABLE) ×3 IMPLANT
SPONGE LAP 18X18 RF (DISPOSABLE) ×1 IMPLANT
SPONGE LAP 4X18 RFD (DISPOSABLE) IMPLANT
SPONGE SURGIFOAM ABS GEL 100 (HEMOSTASIS) ×1 IMPLANT
STAPLER SKIN PROX WIDE 3.9 (STAPLE) ×1 IMPLANT
STAPLER VISISTAT 35W (STAPLE) IMPLANT
STRIP BIOACTIVE VITOSS 25X100X (Neuro Prosthesis/Implant) ×1 IMPLANT
STRIP CLOSURE SKIN 1/2X4 (GAUZE/BANDAGES/DRESSINGS) ×1 IMPLANT
SUT PROLENE 4 0 RB 1 (SUTURE)
SUT PROLENE 4-0 RB1 .5 CRCL 36 (SUTURE) IMPLANT
SUT PROLENE 5 0 CC1 (SUTURE) IMPLANT
SUT PROLENE 6 0 C 1 30 (SUTURE) ×1 IMPLANT
SUT PROLENE 6 0 CC (SUTURE) IMPLANT
SUT SILK 0 TIES 10X30 (SUTURE) ×1 IMPLANT
SUT SILK 2 0 TIES 10X30 (SUTURE) ×2 IMPLANT
SUT SILK 2 0SH CR/8 30 (SUTURE) IMPLANT
SUT SILK 3 0 TIES 10X30 (SUTURE) ×1 IMPLANT
SUT SILK 3 0 TIES 17X18 (SUTURE) ×2
SUT SILK 3 0SH CR/8 30 (SUTURE) IMPLANT
SUT SILK 3-0 18XBRD TIE BLK (SUTURE) ×1 IMPLANT
SUT VIC AB 1 CT1 18XBRD ANBCTR (SUTURE) ×2 IMPLANT
SUT VIC AB 1 CT1 8-18 (SUTURE) ×2
SUT VIC AB 2-0 CP2 18 (SUTURE) ×4 IMPLANT
SUT VIC AB 2-0 CT1 18 (SUTURE) ×3 IMPLANT
SUT VIC AB 3-0 SH 8-18 (SUTURE) ×4 IMPLANT
SUT VIC AB 4-0 RB1 18 (SUTURE) ×1 IMPLANT
SYR TB 1ML 25GX5/8 (SYRINGE) IMPLANT
TAPE CLOTH SURG 4X10 WHT LF (GAUZE/BANDAGES/DRESSINGS) ×2 IMPLANT
TOWEL GREEN STERILE (TOWEL DISPOSABLE) ×3 IMPLANT
TOWEL GREEN STERILE FF (TOWEL DISPOSABLE) ×3 IMPLANT
TRAY FOLEY MTR SLVR 16FR STAT (SET/KITS/TRAYS/PACK) ×2 IMPLANT
TUBE MAZOR SA REDUCTION (TUBING) ×2 IMPLANT
WATER STERILE IRR 1000ML POUR (IV SOLUTION) ×2 IMPLANT

## 2019-03-13 NOTE — Progress Notes (Signed)
Went to talk to patient about using a CPAP machine tonight.  He had surgery today and is supposed to go home in the morning. He did bring his mask from home but the patient thought that he would not use it tonight, so he refused since he is going home tomorrow.  No distress noted, will continue to monitor.

## 2019-03-13 NOTE — Progress Notes (Signed)
Vitals:   03/13/19 1402 03/13/19 1430 03/13/19 1502 03/13/19 1534  BP: 117/65 129/80 124/80 (!) 141/81  Pulse: 66 61 61 73  Resp: 17 20 19 17   Temp: 98 F (36.7 C)  98 F (36.7 C) 98.3 F (36.8 C)  TempSrc:    Oral  SpO2: 100% 100% 99% 96%  Weight:      Height:        Patient resting in bed, moderate back pain and spasm.  Arrived from PACU in the past couple of hours.  Walk from wheelchair to bed, but has not yet ambulated in the halls.  Dressings clean and dry.  Foley to straight drainage.  Plan: Spoke with patient and his wife regarding importance of frequent ambulation both here in the hospital, as well as post discharge at home.  We will plan on removing Foley once patient is sufficiently mobile.  We will continue to progress through postoperative recovery.  , MD 03/13/2019, 5:07 PM

## 2019-03-13 NOTE — Anesthesia Procedure Notes (Signed)
Arterial Line Insertion Start/End1/01/2020 7:45 AM, 03/13/2019 7:57 AM Performed by: Margarita Rana, CRNA, CRNA  Preanesthetic checklist: patient identified, IV checked, site marked, risks and benefits discussed, surgical consent, monitors and equipment checked, pre-op evaluation, timeout performed and anesthesia consent Patient sedated Right, radial was placed Catheter size: 20 G Hand hygiene performed  and maximum sterile barriers used  Allen's test indicative of satisfactory collateral circulation Attempts: 2 Procedure performed without using ultrasound guided technique. Following insertion, dressing applied and Biopatch. Post procedure assessment: normal  Patient tolerated the procedure well with no immediate complications.

## 2019-03-13 NOTE — Op Note (Signed)
Date: March 13, 2019  Preoperative diagnosis: Chronic lower back pain with lumbar radiculopathy  Postoperative diagnosis: Same  Procedure: Anterior spine exposure at the L5-S1 disc space via left retroperitoneal approach  Surgeon: Dr. Marty Heck, MD  Co-surgeon: Dr. Jovita Gamma, MD and Dr. Kristeen Miss, MD  Indications: Patient is a 34 year old male who has had chronic lower back pain with lumbar radiculopathy for several years.  Ultimately he has failed conservative management with persistent pain and discomfort.  He is scheduled for L5-S1 anterior lumbar interbody fusion at the L5-S1 disc space after evaluation by Dr. Sherwood Gambler and vascular surgery was asked to assist with exposure.  Risk and benefits have been discussed with the patient.  Findings: Transverse incision over the left rectus muscle.  Ultimately the left rectus muscle was circumferentially mobilized and entered the retroperitoneal space on the left side lateral to the rectus.  The peritoneum as well as the left ureter were mobilized across midline.  The L5-S1 disc space was identified and the middle sacral vessels were ligated between 3-0 silk ties and vessel clips and divided.  The left iliac vein was then mobilized and this required ligation of one pelvic branch off the left iliac vein.  Ultimately fixed retractors were placed at the L5-S1 disc space and the correct level was confirmed on lateral fluoroscopy.  Anesthesia: General  Details: Patient was taken to the operating room after informed consent was obtained.  He was placed on operative table in the supine position.  General endotracheal anesthesia was induced.  At that point, fluoroscopic C-arm was brought in the lateral position and the L5-S1 disc space was identified and marked on the anterior abdominal wall over the left rectus.  The abdomen was then prepped and draped in usual sterile fashion.  Preoperative antibiotics were given.  Timeout was performed.   Initially made a transverse incision over our marking on the left abdominal wall over the left rectus.  This incision was carried down through the subcutaneous tissue with Bovie cautery.  Ultimately the anterior rectus sheath was opened transversely with Bovie cautery.  I used hemostats and small flaps were raised underneath the anterior rectus sheath.  The left rectus muscle was then circumferentially mobilized.  I then entered lateral into the retroperitoneal space and the peritoneum and intestines were mobilized across midline including the left ureter.  At that point in time my co-surgeon Dr. Sherwood Gambler used long Wiley retractors to pull the ureter and peritoneum across midline to expose L5-S1 disc space.  I continued to mobilize these contents across the midline disc space with KD and suction.  That point in time the middle sacral vessels were ligated between 3-0 silk ties and vessel clips and divided.  There was one branch off the left iliac vein that was ligated between 3-0 silk ties and divided as well.  I then fully mobilized the left iliac vein off the anterior disc space.  At that point in time once we had good mobilization a fixed Thompson retractor was placed.  We first used 150 reverse lip retractor on the patient's right side and a 100 reverse lip retractor on the patient's left side with 140 fixed malleable's cranial and caudally.  At that point in time a spinal needle was placed in the disc space.  We confirmed on lateral fluoroscopy that we were at the correct level.  The case was turned over to Dr. Ellene Route and Dr. Sherwood Gambler.  Please see their dictation for the remainder the case.  Complication: None  Condition: Stable  Cephus Shelling, MD [facility] Office: [phone] Pager: [phone]  Cephus Shelling

## 2019-03-13 NOTE — Transfer of Care (Signed)
Immediate Anesthesia Transfer of Care Note  Patient: Rick Maldonado  Procedure(s) Performed: Lumbar five Sacral one Anterior lumbar interbody fusion with percutaneous posterior instrumentation (N/A Spine Lumbar) LUMBAR PERCUTANEOUS PEDICLE SCREW LUMBAR FIVE-SACRAL ONE LEVEL (N/A Spine Lumbar) APPLICATION OF ROBOTIC ASSISTANCE FOR SPINAL PROCEDURE (N/A Spine Lumbar) ABDOMINAL EXPOSURE (N/A Spine Lumbar)  Patient Location: PACU  Anesthesia Type:General  Level of Consciousness: drowsy  Airway & Oxygen Therapy: Patient Spontanous Breathing and Patient connected to nasal cannula oxygen  Post-op Assessment: Report given to RN and Post -op Vital signs reviewed and stable  Post vital signs: Reviewed and stable  Last Vitals:  Vitals Value Taken Time  BP 111/70 03/13/19 1302  Temp    Pulse 79 03/13/19 1307  Resp 23 03/13/19 1307  SpO2 94 % 03/13/19 1307  Vitals shown include unvalidated device data.  Last Pain:  Vitals:   03/13/19 1305  TempSrc:   PainSc: (P) Asleep      Patients Stated Pain Goal: 2 (03/13/19 7185)  Complications: No apparent anesthesia complications

## 2019-03-13 NOTE — Anesthesia Postprocedure Evaluation (Signed)
Anesthesia Post Note  Patient: EUTIMIO GHARIBIAN  Procedure(s) Performed: Lumbar five Sacral one Anterior lumbar interbody fusion with percutaneous posterior instrumentation (N/A Spine Lumbar) LUMBAR PERCUTANEOUS PEDICLE SCREW LUMBAR FIVE-SACRAL ONE LEVEL (N/A Spine Lumbar) APPLICATION OF ROBOTIC ASSISTANCE FOR SPINAL PROCEDURE (N/A Spine Lumbar) ABDOMINAL EXPOSURE (N/A Spine Lumbar)     Patient location during evaluation: PACU Anesthesia Type: General Level of consciousness: awake and alert Pain management: pain level controlled Vital Signs Assessment: post-procedure vital signs reviewed and stable Respiratory status: spontaneous breathing, nonlabored ventilation and respiratory function stable Cardiovascular status: blood pressure returned to baseline and stable Postop Assessment: no apparent nausea or vomiting Anesthetic complications: no    Last Vitals:  Vitals:   03/13/19 1502 03/13/19 1534  BP: 124/80 (!) 141/81  Pulse: 61 73  Resp: 19 17  Temp: 36.7 C 36.8 C  SpO2: 99% 96%    Last Pain:  Vitals:   03/13/19 1534  TempSrc: Oral  PainSc:                  Beryle Lathe

## 2019-03-13 NOTE — Anesthesia Procedure Notes (Signed)
Procedure Name: Intubation Date/Time: 03/13/2019 7:47 AM Performed by: Elliot Dally, CRNA Pre-anesthesia Checklist: Patient identified, Emergency Drugs available, Suction available and Patient being monitored Patient Re-evaluated:Patient Re-evaluated prior to induction Oxygen Delivery Method: Circle System Utilized Preoxygenation: Pre-oxygenation with 100% oxygen Induction Type: IV induction Ventilation: Mask ventilation without difficulty Laryngoscope Size: Miller and 3 Grade View: Grade I Tube type: Oral Number of attempts: 1 Airway Equipment and Method: Stylet and Oral airway Placement Confirmation: ETT inserted through vocal cords under direct vision,  positive ETCO2 and breath sounds checked- equal and bilateral Secured at: 22 cm Tube secured with: Tape Dental Injury: Teeth and Oropharynx as per pre-operative assessment

## 2019-03-13 NOTE — H&P (Signed)
Subjective: Patient is a 34 y.o. right-handed white male who is admitted for treatment of a grade 2 dynamic degenerative spondylolisthesis secondary to bilateral L5 pars interarticularis defects.  Patient has low back and bilateral lumbar radicular pain for 2-1/2 years.  He has been treated with NSAIDs, chiropractic treatment, and spinal injections without relief.  Because of persistent pain and discomfort, and significant deformity and instability, the patient was brought to surgery.  He is admitted for an L5-S1 anterior lumbar interbody arthrodesis with interbody implants with percutaneous pedicle screw and rod fixation, using the Mazor for intraoperative stereotactic guidance.                                           Patient Active Problem List   Diagnosis Date Noted  . DDD (degenerative disc disease), lumbosacral   . Prediabetes   . Dyslipidemia    Past Medical History:  Diagnosis Date  . Acid reflux   . Ankle pain   . Buttock pain   . DDD (degenerative disc disease), lumbosacral    bilateral neural foraminal stenosis at L5-S1, mass effect on B exiting L5 nerve roots (2/20)  . Dyslipidemia   . Hip pain   . Knee pain   . Leg pain   . Prediabetes   . Sleep apnea    cpap  . Spondylolysis     Past Surgical History:  Procedure Laterality Date  . TONSILLECTOMY     as a child    Medications Prior to Admission  Medication Sig Dispense Refill Last Dose  . gabapentin (NEURONTIN) 300 MG capsule Take 1 capsule (300 mg total) by mouth 3 (three) times daily. As needed for nerve pain (Patient taking differently: Take 300 mg by mouth 3 (three) times daily as needed (nerves (typically twice daily)). ) 90 capsule 3 03/12/2019 at Unknown time  . ibuprofen (ADVIL) 200 MG tablet Take 400-600 mg by mouth every 8 (eight) hours as needed (pain.).   Past Month at Unknown time  . Biotin w/ Vitamins C & E (HAIR/SKIN/NAILS PO) Take 1 tablet by mouth daily.     Marland Kitchen tiZANidine (ZANAFLEX) 4 MG tablet Take 1  tablet (4 mg total) by mouth every 6 (six) hours as needed for muscle spasms. (Patient not taking: Reported on 02/28/2019) 30 tablet 0   . UNABLE TO FIND CPAP for OSA      Allergies  Allergen Reactions  . Morphine And Related Anaphylaxis and Other (See Comments)    Throat closing  . Codeine Other (See Comments)    Reaction unknown  . Penicillins Other (See Comments)    Reaction unknown Did it involve swelling of the face/tongue/throat, SOB, or low BP? Unknown Did it involve sudden or severe rash/hives, skin peeling, or any reaction on the inside of your mouth or nose? Unknown Did you need to seek medical attention at a hospital or doctor's office? Unknown When did it last happen?Informed as a child that he was allergic If all above answers are "NO", may proceed with cephalosporin use.   . Sulfa Antibiotics Other (See Comments)    Reaction unknown    Social History   Tobacco Use  . Smoking status: Never Smoker  . Smokeless tobacco: Never Used  Substance Use Topics  . Alcohol use: Yes    Comment: rarely    Family History  Problem Relation Age of Onset  .  Other Mother        drug addiction  . Arthritis Father      Review of Systems Pertinent items noted in HPI and remainder of comprehensive ROS otherwise negative.  Objective: Vital signs in last 24 hours: Temp:  [98.1 F (36.7 C)] 98.1 F (36.7 C) (01/11 0617) Pulse Rate:  [58] 58 (01/11 0617) Resp:  [18] 18 (01/11 0617) BP: (107)/(75) 107/75 (01/11 0617) SpO2:  [98 %] 98 % (01/11 0617) Weight:  [88.2 kg] 88.2 kg (01/11 0617)  EXAM: Patient is well-developed well-nourished white male in discomfort, but no acute distress.   Lungs are clear to auscultation , the patient has symmetrical respiratory excursion. Heart has a regular rate and rhythm normal S1 and S2 no murmur.   Abdomen is soft nontender nondistended bowel sounds are present. Extremity examination shows no clubbing cyanosis or edema. Motor examination shows  5 over 5 strength in the lower extremities including the iliopsoas quadriceps dorsiflexor extensor hallicus  longus and plantar flexor bilaterally. Sensation is intact to pinprick in the distal lower extremities. Reflexes are symmetrical bilaterally. No pathologic reflexes are present. Patient has a normal gait and stance.  Data Review:CBC    Component Value Date/Time   WBC 7.4 03/09/2019 1343   RBC 5.22 03/09/2019 1343   HGB 16.5 03/09/2019 1343   HCT 48.6 03/09/2019 1343   PLT 196 03/09/2019 1343   MCV 93.1 03/09/2019 1343   MCH 31.6 03/09/2019 1343   MCHC 34.0 03/09/2019 1343   RDW 11.8 03/09/2019 1343   LYMPHSABS 2,060 01/25/2019 0900   MONOABS 570 01/07/2016 0825   EOSABS 99 01/25/2019 0900   BASOSABS 30 01/25/2019 0900                          BMET    Component Value Date/Time   NA 137 03/09/2019 1343   K 4.0 03/09/2019 1343   CL 105 03/09/2019 1343   CO2 23 03/09/2019 1343   GLUCOSE 88 03/09/2019 1343   BUN 17 03/09/2019 1343   CREATININE 1.19 03/09/2019 1343   CREATININE 1.18 01/25/2019 0900   CALCIUM 9.4 03/09/2019 1343   GFRNONAA >60 03/09/2019 1343   GFRNONAA 80 01/25/2019 0900   GFRAA >60 03/09/2019 1343   GFRAA 93 01/25/2019 0900     Assessment/Plan: Patient with low back and bowel lumbar radicular pain secondary to L5-S1 grade 2 dynamic degenerative spondylolisthesis secondary to bilateral pars interarticularis defects who is admitted now for decompression and stabilization.  Plan is for two-stage approach, initially with an L5-S1 ALIF with the assistance of Dr. Tawanna Cooler Early from vascular surgery and my neurosurgical partner Dr. Barnett Abu, then subsequently with percutaneous pedicle screw and rod fixation with the intraoperative Mazor robot.  I've discussed with the patient the nature of his condition, the nature the surgical procedure, the typical length of surgery, hospital stay, and overall recuperation, the limitations postoperatively, and risks of surgery.  I discussed risks including risks of infection, bleeding, possibly need for transfusion, the risk of nerve root dysfunction with pain, weakness, numbness, or paresthesias, the risk of dural tear and CSF leakage and possible need for further surgery, the risk of failure of the arthrodesis and possibly for further surgery, the risk of anesthetic complications including myocardial infarction, stroke, pneumonia, and death.  Dr. Arbie Cookey has discussed the vascular and abdominal exposure risks with the patient.  We discussed the need for postoperative immobilization in a lumbar brace. Understanding all this  the patient does wish to proceed with surgery and is admitted for such.    Hosie Spangle, MD 03/13/2019 7:18 AM

## 2019-03-13 NOTE — H&P (Signed)
History and Physical Interval Note:  03/13/2019 7:33 AM  Rick Maldonado  has presented today for surgery, with the diagnosis of Lumbar stenosis with neurogenic claudication.  The various methods of treatment have been discussed with the patient and family. After consideration of risks, benefits and other options for treatment, the patient has consented to  Procedure(s) with comments: Lumbar 5 Sacral 1 Anterior lumbar interbody fusion with percutaneous posterior instrumentation (N/A) - Lumbar 5 Sacral 1 Anterior lumbar interbody fusion with percutaneous posterior instrumentation LUMBAR PERCUTANEOUS PEDICLE SCREW 1 LEVEL (N/A) APPLICATION OF ROBOTIC ASSISTANCE FOR SPINAL PROCEDURE (N/A) ABDOMINAL EXPOSURE (N/A) as a surgical intervention.  The patient's history has been reviewed, patient examined, no change in status, stable for surgery.  I have reviewed the patient's chart and labs.  Questions were answered to the patient's satisfaction.    ALIF L5-S1.  Risks and benefits discussed again.  Marty Heck  Patient name: Rick Maldonado MRN: [id number] DOB: [birth year] Sex: male  REASON FOR CONSULT: Evaluate for L5-S1 ALIF  HPI:  Rick Maldonado is a 34 y.o. male, with history of obstructive sleep apnea that presents for preoperative evaluation of planned L5-S1 ALIF. Patient states he has had several years of lower back pain with some radiculopathy in the left leg. He has ultimately failed conservative management including physical therapy and multiple injections. He is under the care of Dr. Sherwood Gambler. Ultimately it was recommended for a L5-S1 anterior approach. Patient denies any history of abdominal surgery. He works as a Government social research officer for [employer]. He scheduled for 03/13/2019.      Past Medical History:  Diagnosis Date  . Acid reflux   . Ankle pain   . Buttock pain   . DDD (degenerative disc disease), lumbosacral    bilateral neural foraminal stenosis at L5-S1, mass effect on B  exiting L5 nerve roots (2/20)  . Dyslipidemia   . Hip pain   . Knee pain   . Leg pain   . Prediabetes   . Sleep apnea         Past Surgical History:  Procedure Laterality Date  . TONSILLECTOMY     as a child        Family History  Problem Relation Age of Onset  . Other Mother    drug addiction  . Arthritis Father    SOCIAL HISTORY:  Social History        Socioeconomic History  . Marital status: Married    Spouse name: Belenda Cruise  . Number of children: 0  . Years of education: BS  . Highest education level: Not on file  Occupational History  . Occupation: Sales promotion account executive    Comment: [employer]  Tobacco Use  . Smoking status: Never Smoker  . Smokeless tobacco: Never Used  Substance and Sexual Activity  . Alcohol use: Yes    Comment: rarely  . Drug use: No  . Sexual activity: Not on file  Other Topics Concern  . Not on file  Social History Narrative   Patient consumes 2 glasses of caffeine daily,is right handed   Social Determinants of Health      Financial Resource Strain:   . Difficulty of Paying Living Expenses: Not on file  Food Insecurity:   . Worried About Charity fundraiser in the Last Year: Not on file  . Ran Out of Food in the Last Year: Not on file  Transportation Needs:   . Lack of Transportation (Medical): Not on file  .  Lack of Transportation (Non-Medical): Not on file  Physical Activity:   . Days of Exercise per Week: Not on file  . Minutes of Exercise per Session: Not on file  Stress:   . Feeling of Stress : Not on file  Social Connections:   . Frequency of Communication with Friends and Family: Not on file  . Frequency of Social Gatherings with Friends and Family: Not on file  . Attends Religious Services: Not on file  . Active Member of Clubs or Organizations: Not on file  . Attends Banker Meetings: Not on file  . Marital Status: Not on file  Intimate Partner Violence:   . Fear of Current or Ex-Partner: Not on file   . Emotionally Abused: Not on file  . Physically Abused: Not on file  . Sexually Abused: Not on file        Allergies  Allergen Reactions  . Codeine     Reaction unknown  . Morphine And Related     Throat closing  . Penicillins     Reaction unknown  . Sulfa Antibiotics     Reaction unknown         Current Outpatient Medications  Medication Sig Dispense Refill  . gabapentin (NEURONTIN) 300 MG capsule Take 1 capsule (300 mg total) by mouth 3 (three) times daily. As needed for nerve pain 90 capsule 3  . UNABLE TO FIND CPAP for OSA    . diclofenac (VOLTAREN) 75 MG EC tablet Take 75 mg by mouth 2 (two) times daily.    Marland Kitchen tiZANidine (ZANAFLEX) 4 MG tablet Take 1 tablet (4 mg total) by mouth every 6 (six) hours as needed for muscle spasms. (Patient not taking: Reported on 02/28/2019) 30 tablet 0   No current facility-administered medications for this visit.   REVIEW OF SYSTEMS:  [X]  denotes positive finding, [ ]  denotes negative finding  Cardiac  Comments:  Chest pain or chest pressure:    Shortness of breath upon exertion:    Short of breath when lying flat:    Irregular heart rhythm:        Vascular    Pain in calf, thigh, or hip brought on by ambulation:    Pain in feet at night that wakes you up from your sleep:     Blood clot in your veins:    Leg swelling:         Pulmonary    Oxygen at home:    Productive cough:     Wheezing:         Neurologic    Sudden weakness in arms or legs:     Sudden numbness in arms or legs:     Sudden onset of difficulty speaking or slurred speech:    Temporary loss of vision in one eye:     Problems with dizziness:     Back pain x   Gastrointestinal    Blood in stool:     Vomited blood:         Genitourinary    Burning when urinating:     Blood in urine:        Psychiatric    Major depression:         Hematologic    Bleeding problems:    Problems with blood clotting too easily:        Skin    Rashes or ulcers:         Constitutional    Fever or chills:  PHYSICAL EXAM:      Vitals:   02/28/19 0906 02/28/19 0909  BP:  124/84  Pulse:  (!) 59  Resp: 16 18  Temp:  (!) 97.3 F (36.3 C)  TempSrc:  Temporal  SpO2:  98%  Weight: 197 lb (89.4 kg) 197 lb (89.4 kg)  Height: 5\' 7"  (1.702 m) 5\' 7"  (1.702 m)   GENERAL: The patient is a well-nourished male, in no acute distress. The vital signs are documented above.  CARDIAC: There is a regular rate and rhythm.  VASCULAR:  Palpable femoral pulses bilaterally  Palpable DP pulses bilaterally  PULMONARY: There is good air exchange bilaterally without wheezing or rales.  ABDOMEN: Soft and non-tender with normal pitched bowel sounds.  MUSCULOSKELETAL: There are no major deformities or cyanosis.  NEUROLOGIC: No focal weakness or paresthesias are detected.  SKIN: There are no ulcers or rashes noted.  PSYCHIATRIC: The patient has a normal affect.  DATA:  I independently reviewed his CT L-spine from 01/17/2019 and his aortic bifurcation appears to be at L4 with the iliac vein bifurcation at L5. No significant atherosclerotic disease.  Assessment/Plan:  34 year old male that presents for preop evaluation prior to planned L5-S1 ALIF. I think he would be an excellent candidate for an anterior approach. He has never had any abdominal surgery in the past. I did review his CT lumbar spine imaging. I discussed the steps of surgery with the patient's with left transverse incision over rectus muscle and retroperitoneal exposure of disc space as well as risk benefits. We discussed risk of bleeding including iliac vein injury, injury to the left rectus muscle and/or intestines/ ureter and risk of retrograde ejaculation. I look forward to assisting Dr. 01/19/2019 and Dr. 20.  Newell Coral, MD  [facility]  Office: [phone]  Pager: [phone]   [phone]

## 2019-03-13 NOTE — Op Note (Addendum)
03/13/2019  10:37 AM  PATIENT:  Norm Salt  34 y.o. male  PRE-OPERATIVE DIAGNOSIS: L5-S1 grade 2 dynamic spondylolisthesis secondary to bilateral L5 pars interarticularis defects with lumbago and bilateral lumbar radiculopathy  POST-OPERATIVE DIAGNOSIS:  L5-S1 grade 2 dynamic spondylolisthesis secondary to bilateral L5 pars interarticularis defects with lumbago and bilateral lumbar radiculopathy  PROCEDURE:  Procedure(s): Lumbar five Sacral one Anterior lumbar interbody fusion with percutaneous posterior instrumentation ABDOMINAL EXPOSURE LUMBAR 5 - SACRAL 1 ANTERIOR LUMBAR INTERBODY ARTHRODESIS with Medtronic titanium interbody implant, Vitoss BA, and infuse LUMBAR PERCUTANEOUS PEDICLE SCREW AND ROD FIXATION LUMBAR FIVE-SACRAL ONE LEVEL APPLICATION OF ROBOTIC STEREOTACTIC ASSISTANCE FOR SPINAL PROCEDURE   SURGEON:  Surgeon(s): Jovita Gamma, MD Marty Heck, MD Kristeen Miss, MD  ANESTHESIA:   general  EBL:  Total I/O In: 8099 [I.V.:1300; IV Piggyback:450] Out: 200 [Urine:100; Blood:100]  BLOOD ADMINISTERED:none  CELL SAVER GIVEN: Insufficient blood loss to process the collected blood  COUNT:   Stage 1: Correct per nursing staff; stage 2: Correct per nursing staff  DICTATION: Surgery was done in a two-stage procedure.  Prior to surgery the patient had undergone a stereotactic protocol CT of the lumbar spine, that was downloaded into the [place]in[place].  Preoperative planning for placement of L5 and S1 pedicle screws was performed.  The initial stage was done in the combined fashion between the neurosurgical and vascular surgical services.  C arm fluoroscopy was used throughout the case. Dr. Carlis Abbott performed, and dictated separately, a retroperitoneal, abdominal exposure to the ventral aspect of the lumbosacral spine.  Incision was planned with the C-arm fluoroscope, and then the abdomen was shaved with electric clippers, and prepped with Betadine soap and solution draped in  sterile fashion.  Once he had the exposure completed, he set up to the [facility] retractor, exposing the ventral aspect of the L5-S1.  We then proceeded with the L5-S1 anterior lumbar interbody arthrodesis.  The C-arm fluoroscope was used to confirm localization at L5-S1.  Anterior annulus was incised, and the disc space entered.  Discectomy was performed in a piecemeal fashion.  A Cobb elevator was used to elevate the disc and cartilaginous endplates from the inferior surface of L5 and the superior surface of S1.  Discectomy was continued posteriorly to the posterior longitudinal ligament which was left intact.  Discectomy was extended laterally.  In the end good endplate bony surfaces were established.  We then used a variety of sizers, and selected a 12, 14 mm in anterior height, large footplant Medtronic endoskeleton TAS implant.  The implant was packed with Vitoss BA and infuse.  It was gently tamped into position in the intervertebral disc space with C-arm fluoroscopic guidance.  We then placed 5.5 x 25 mm screws.  Screw holes were started with an awl; 2 screws were placed in S1, 1 screw was placed in L5.  Additional Vitoss BA was packed lateral to the implant, and anterior to the implant.  We then irrigated the wound extensively with bacitracin solution, remove the retractor blades sequentially, checking for any bleeding.  No bleeding was encountered.  Final x-ray images were then performed with the C arm.  There was closed in multiple layers.  Ventral abdominal fascia was closed with running 1 PDS suture.  Scarpa's fascia was closed with interrupted inverted 2-0 undyed Vicryl suture.  Subcutaneous and subcuticular closed with interrupted inverted 2-0 and 3-0 undyed Vicryl suture.  Skin edges were approximated with Dermabond.  A dressing of sterile gauze and Hypafix was applied.  An  x-ray was taken, and the interpreting radiologist called back that there were no retained instruments or marked sponges.   Estimated blood loss for the initial stage of the two-stage surgery was 50 cc.  Following closure of the abdominal exposure, the patient was turned and positioned on the [place] table for the subsequent stage of the two-stage procedure.  The Mazor unit was attached to the [place] table.  The lumbosacral region was prepped with Betadine soap and solution and draped in a sterile fashion.  A small stab incision was made over the left posterior iliac crest (the incision region was infiltrated local anesthetic with epinephrine).  The fixed registration pin was drilled into the left posterior iliac crest, and the robotic arm was secured to it.  The Mazor was registered to the patient, and then AP and oblique C-arm images were obtained, and final registration was performed.  Entry points for the L5 and S1 pedicle screws bilaterally were determined using the Mazor.  A pair of parasagittal incisions was then planned, and the line of each incision was infiltrated with local anesthetic with epinephrine.  Incisions were made bilaterally through the skin and subcutaneous tissue.  For each screw, the fascia was incised, and the Mazor positioned, the series of guidance was passed down to the posterior bony surface, the drill guide was placed, and each pilot hole was drilled.  We then passed the K wire guide and then the K wire into the vertebral body.  Once the K wire was positioned, the K wire guide and the drill guides were removed.  Once all 4 K wires were placed, AP and lateral C-arm images were obtained to confirm good positioning.  Mazor was detached from the fixed registration pin.  Each screw hole was tapped with a 5.5 mm tap over the K wire.  6.5 mm Solera Voyager screws were used, with a pair of 45 mm screws at L5, and a pair of 40 mm screws at S1.  Once all 4 screws were placed, the rod length was measured and determined.  We used 5.5 x 40 mm rods bilaterally.  Locking caps were placed.  Once all 4 locking caps were  placed, final tightening was performed against a counter torque.  Final images were obtained with the C arm.  The fixed registration pin was removed.  The screw superstructures were then removed from each screw.  We then proceeded with closure.  Deep fascia was closed with interrupted undyed 2-0 Vicryl sutures.  Scarpa's fascia was closed with interrupted undyed 2-0 Vicryl sutures.  The subcutaneous and subcuticular layer was closed with interrupted inverted 2-0, 3-0, and 4-0 undyed Vicryl sutures.  Skin edges were approximated with Dermabond.  A dressing of sterile gauze and Hypafix was applied.  Estimate blood loss for this subsequent stage of the two-stage surgery was 50 cc.  Following surgery the patient is to be turned back to a supine position, reversed in anesthetic, extubated, and transferred to the recovery room for further care.  PLAN OF CARE: Admit to inpatient   PATIENT DISPOSITION:  PACU - hemodynamically stable.   Delay start of Pharmacological VTE agent (>24hrs) due to surgical blood loss or risk of bleeding:  yes

## 2019-03-14 ENCOUNTER — Encounter: Payer: Self-pay | Admitting: *Deleted

## 2019-03-14 MED ORDER — HYDROCODONE-ACETAMINOPHEN 5-325 MG PO TABS: 1.0000 | ORAL_TABLET | ORAL | 0 refills | Status: DC | PRN

## 2019-03-14 MED ORDER — CYCLOBENZAPRINE HCL 10 MG PO TABS: 5.0000 mg | ORAL_TABLET | Freq: Three times a day (TID) | ORAL | 0 refills | Status: DC | PRN

## 2019-03-14 MED ORDER — CYCLOBENZAPRINE HCL 10 MG PO TABS: 10.0000 mg | ORAL_TABLET | Freq: Three times a day (TID) | ORAL | Status: DC | PRN

## 2019-03-14 NOTE — Progress Notes (Signed)
     S/P anterior lumbar exposuer for L5-S1 anterior fusion.  Moving all ext Palpable pedal pulses Left anterior LQ abdominal incision healing well with minimal echymosis  Disposition stable  Mosetta Pigeon PA-C

## 2019-03-14 NOTE — Discharge Instructions (Signed)
°  Call Your Doctor If Any of These Occur °Redness, drainage, or swelling at the wound.  °Temperature greater than 101 degrees. °Severe pain not relieved by pain medication. °Incision starts to come apart. °Follow Up Appt °Call today for appointment in 3 weeks [phone]) or for problems.  If you have any hardware placed in your spine, you will need an x-ray before your appointment. °

## 2019-03-14 NOTE — Discharge Summary (Signed)
Physician Discharge Summary  Patient ID: Rick Maldonado MRN: [id number] DOB/AGE: 35-Jun-1987 35 y.o.  Admit date: 03/13/2019 Discharge date: 03/14/2019  Admission Diagnoses:  L5-S1 grade 2 dynamic spondylolisthesis secondary to bilateral L5 pars interarticularis defects with lumbago and bilateral lumbar radiculopathy  Discharge Diagnoses:  L5-S1 grade 2 dynamic spondylolisthesis secondary to bilateral L5 pars interarticularis defects with lumbago and bilateral lumbar radiculopathy Active Problems:   Spondylolisthesis, lumbar region   Discharged Condition: good  Hospital Course: Patient was admitted, underwent a L5-S1 ALIF with myself, Dr. Monica Martinez (vascular surgery), and Dr. Kristeen Miss.  Dr. Ellene Route and I then placed L5-S1 percutaneous pedicle screws with stereotactic guidance.  Postoperatively the patient has done well.  Moderate discomfort, that is improved as he is been able to increase his ambulation.  He is walking actively in the halls.  He has voided well, since his Foley was DC'd.  He has had a bowel movement.  His dressings were removed, and his incisions are healing nicely, they are clean and dry.  He is being discharged home with instructions regarding wound care and activities.  He is scheduled follow-up with me in the office in about 3 weeks with x-rays.  Discharge Exam: Blood pressure 121/77, pulse 87, temperature 98.3 F (36.8 C), temperature source Oral, resp. rate 18, height 5\' 7"  (1.702 m), weight 88.2 kg, SpO2 97 %.  Disposition:  Home  Discharge Instructions    Discharge wound care:   Complete by: As directed    Leave the wound open to air. Shower daily with the wound uncovered. Water and soapy water should run over the incision area. Do not wash directly on the incision for 2 weeks. Remove the glue after 2 weeks.   Driving Restrictions   Complete by: As directed    No driving for 2 weeks. May ride in the car locally now. May begin to drive locally in 2  weeks.   Other Restrictions   Complete by: As directed    Walk gradually increasing distances out in the fresh air at least twice a day. Walking additional 6 times inside the house, gradually increasing distances, daily. No bending, lifting, or twisting. Perform activities between shoulder and waist height (that is at counter height when standing or table height when sitting).     Allergies as of 03/14/2019      Reactions   Morphine And Related Anaphylaxis   03/13/19 Per pt, he was told that he had throat closing to morphine when his tonsils were removed as a child. Has tolerated oxycodone and hydromorphone for various procedures as an adult    Codeine Other (See Comments)   Reaction unknown   Penicillins Other (See Comments)   Reaction unknown Did it involve swelling of the face/tongue/throat, SOB, or low BP? Unknown Did it involve sudden or severe rash/hives, skin peeling, or any reaction on the inside of your mouth or nose? Unknown Did you need to seek medical attention at a hospital or doctor's office? Unknown When did it last happen?Informed as a child that he was allergic If all above answers are "NO", may proceed with cephalosporin use.   Sulfa Antibiotics Other (See Comments)   Reaction unknown      Medication List    STOP taking these medications   tiZANidine 4 MG tablet Commonly known as: Zanaflex     TAKE these medications   cyclobenzaprine 10 MG tablet Commonly known as: FLEXERIL Take 0.5-1 tablets (5-10 mg total) by mouth 3 (three) times daily  as needed for muscle spasms.   gabapentin 300 MG capsule Commonly known as: NEURONTIN Take 1 capsule (300 mg total) by mouth 3 (three) times daily. As needed for nerve pain What changed:   when to take this  reasons to take this  additional instructions   HAIR/SKIN/NAILS PO Take 1 tablet by mouth daily.   HYDROcodone-acetaminophen 5-325 MG tablet Commonly known as: NORCO/VICODIN Take 1-2 tablets by mouth every 4  (four) hours as needed (pain).   ibuprofen 200 MG tablet Commonly known as: ADVIL Take 400-600 mg by mouth every 8 (eight) hours as needed (pain.).   UNABLE TO FIND CPAP for OSA            Discharge Care Instructions  (From admission, onward)         Start     Ordered   03/14/19 0000  Discharge wound care:    Comments: Leave the wound open to air. Shower daily with the wound uncovered. Water and soapy water should run over the incision area. Do not wash directly on the incision for 2 weeks. Remove the glue after 2 weeks.   03/14/19 8873           Signed: Hewitt Shorts, MD 03/14/2019, 8:25 AM

## 2019-03-14 NOTE — Plan of Care (Signed)
Patient alert and oriented, mae's well, voiding adequate amount of urine, swallowing without difficulty, no c/o pain at time of discharge. Patient discharged home with family. Script and discharged instructions given to patient. Patient and family stated understanding of instructions given. Patient has an appointment with Dr. Nudelman 

## 2019-03-15 MED FILL — Sodium Chloride IV Soln 0.9%: INTRAVENOUS | Qty: 1000 | Status: AC

## 2019-03-15 MED FILL — Heparin Sodium (Porcine) Inj 1000 Unit/ML: INTRAMUSCULAR | Qty: 30 | Status: AC

## 2019-04-06 ENCOUNTER — Other Ambulatory Visit: Payer: Self-pay

## 2019-04-06 ENCOUNTER — Ambulatory Visit: Payer: 59 | Attending: Neurosurgery

## 2019-04-06 DIAGNOSIS — G8929 Other chronic pain: Secondary | ICD-10-CM | POA: Diagnosis present

## 2019-04-06 DIAGNOSIS — M25651 Stiffness of right hip, not elsewhere classified: Secondary | ICD-10-CM | POA: Diagnosis present

## 2019-04-06 DIAGNOSIS — M545 Low back pain: Secondary | ICD-10-CM | POA: Diagnosis present

## 2019-04-06 DIAGNOSIS — M6281 Muscle weakness (generalized): Secondary | ICD-10-CM | POA: Insufficient documentation

## 2019-04-06 DIAGNOSIS — M25652 Stiffness of left hip, not elsewhere classified: Secondary | ICD-10-CM | POA: Diagnosis present

## 2019-04-06 NOTE — Patient Instructions (Signed)
Access Code: [email or web address]  URL: [email or web address]  Date: 04/06/2019  Prepared by: Lorrene Reid   Exercises Supine Straight Leg Raises - 10 reps - 2 sets - 2x daily - 7x weekly                  Clamshell - 10 reps - 3 sets - 1x daily - 7x weekly Seated Transversus Abdominis Bracing - 10 reps - 3 sets - 1x daily - 7x weekly Standing Transverse Abdominis Contraction - 10 reps - 3 sets - 1x daily - 7x weekly Supine Transversus Abdominis Bracing - Hands on Ground - 10 reps - 3 sets - 1x daily - 7x weekly

## 2019-04-06 NOTE — Therapy (Signed)
[facility] [address], [place], [place], [address] Phone: [phone]   Fax:  [phone]  Physical Therapy Evaluation  Patient Details  Name: Rick Maldonado MRN: [id number] Date of Birth: 25-May-1985 Referring Provider (PT): Jovita Gamma, MD   Encounter Date: 04/06/2019  PT End of Session - 04/06/19 1145    Visit Number  1    Date for PT Re-Evaluation  05/18/19    PT Start Time  1113    PT Stop Time  1145    PT Time Calculation (min)  32 min    Activity Tolerance  Patient tolerated treatment well    Behavior During Therapy  [facility] for tasks assessed/performed       Past Medical History:  Diagnosis Date  . Acid reflux   . Ankle pain   . Buttock pain   . DDD (degenerative disc disease), lumbosacral    bilateral neural foraminal stenosis at L5-S1, mass effect on B exiting L5 nerve roots (2/20)  . Dyslipidemia   . Hip pain   . Knee pain   . Leg pain   . Prediabetes   . Sleep apnea    cpap  . Spondylolysis     Past Surgical History:  Procedure Laterality Date  . ABDOMINAL EXPOSURE N/A 03/13/2019   Procedure: ABDOMINAL EXPOSURE;  Surgeon: Marty Heck, MD;  Location: [place];  Service: Vascular;  Laterality: N/A;  . ANTERIOR LUMBAR FUSION N/A 03/13/2019   Procedure: Lumbar five Sacral one Anterior lumbar interbody fusion with percutaneous posterior instrumentation;  Surgeon: Jovita Gamma, MD;  Location: [place];  Service: Neurosurgery;  Laterality: N/A;  . APPLICATION OF ROBOTIC ASSISTANCE FOR SPINAL PROCEDURE N/A 03/13/2019   Procedure: APPLICATION OF ROBOTIC ASSISTANCE FOR SPINAL PROCEDURE;  Surgeon: Jovita Gamma, MD;  Location: [place];  Service: Neurosurgery;  Laterality: N/A;  . LUMBAR PERCUTANEOUS PEDICLE SCREW 1 LEVEL N/A 03/13/2019   Procedure: LUMBAR PERCUTANEOUS PEDICLE SCREW LUMBAR FIVE-SACRAL ONE LEVEL;  Surgeon: Jovita Gamma, MD;  Location: [place];  Service: Neurosurgery;  Laterality:  N/A;  . TONSILLECTOMY     as a child    There were no vitals filed for this visit.   Subjective Assessment - 04/06/19 1118    Subjective  Pt presents to PT s/p L5-S1 anterior lumbar fusion.  Prior to surgery, pt had Lt LE radiculopathy.    Pertinent History  lumbar fusion L5-S1    Limitations  Sitting    How long can you sit comfortably?  5 minutes, begin to have pain in Lt LE    Patient Stated Goals  improve strength, flexibility, wean from lumbar brace per MD orders    Currently in Pain?  Yes    Pain Score  3     Pain Location  Leg    Pain Orientation  Lower;Left    Pain Descriptors / Indicators  Burning;Radiating    Pain Type  Chronic pain    Pain Onset  More than a month ago    Pain Frequency  Constant    Aggravating Factors   sitting    Pain Relieving Factors  standing, walking         [facility] PT Assessment - 04/06/19 0001      Assessment   Medical Diagnosis  s/p lumbar fusion (L5-S1)    Referring Provider (PT)  Jovita Gamma, MD    Onset Date/Surgical Date  03/13/19   2 year history of Lt LE radiculopathy   Next MD  Visit  05/16/2019      Precautions   Precautions  Back    Precaution Comments  wear brace for another 2 weeks and begin to wean 25% each week      Restrictions   Weight Bearing Restrictions  No      Balance Screen   Has the patient fallen in the past 6 months  No    Has the patient had a decrease in activity level because of a fear of falling?   No    Is the patient reluctant to leave their home because of a fear of falling?   No      Home Environment   Living Environment  Private residence    Type of Home  House    Home Access  Stairs to enter    Entrance Stairs-Number of Steps  5    Home Layout  One level      Prior Function   Level of Independence  Independent    Vocation  Full time employment    Vocation Requirements  on leave right now, will return to work next week       Cognition   Overall Cognitive Status  Within Functional Limits  for tasks assessed      Observation/Other Assessments   Focus on Therapeutic Outcomes (FOTO)   47% limitation      Posture/Postural Control   Posture/Postural Control  Postural limitations    Postural Limitations  Flexed trunk      ROM / Strength   AROM / PROM / Strength  AROM;PROM;Strength      AROM   Overall AROM   Deficits    Overall AROM Comments  Lumbar A/ROM not tested.  Lt hip flexibility is limited by 25-50% with Lt LE radiculopathy with ER of the Lt hip      PROM   Overall PROM   Deficits    Overall PROM Comments  Lt hamstring 45 degrees, Rt 75 degrees, Lt ER limited by 25% vs the Rt      Strength   Overall Strength  Deficits    Strength Assessment Site  Knee;Hip    Right/Left Hip  Right;Left    Right Hip Flexion  4/5    Right Hip Extension  4/5    Right Hip External Rotation   4+/5    Right Hip Internal Rotation  4+/5    Right Hip ABduction  4/5    Left Hip Flexion  4-/5    Left Hip Extension  4-/5    Left Hip External Rotation  4/5    Left Hip Internal Rotation  4/5    Left Hip ABduction  4/5    Right/Left Knee  Right;Left    Right Knee Flexion  4+/5    Right Knee Extension  4+/5    Left Knee Flexion  4+/5    Left Knee Extension  4+/5      Palpation   Palpation comment  palpable tenderness around incision over Lt abdomen.  Generalized tenderness over bil lumbar spine       Transfers   Transfers  Independent with all Transfers      Ambulation/Gait   Gait Pattern  Step-through pattern;Decreased stride length;Decreased trunk rotation                Objective measurements completed on examination: See above findings.              PT Education - 04/06/19 1144    Education  Details  Access Code: [email or web address]    Person(s) Educated  Patient    Methods  Explanation;Demonstration;Handout    Comprehension  Verbalized understanding;Returned demonstration       PT Short Term Goals - 04/06/19 1150      PT SHORT TERM GOAL #1   Title  be  independent in initial HEP    Time  3    Period  Weeks    Status  New    Target Date  04/27/19      PT SHORT TERM GOAL #2   Title  demonstrate Lt hamstring/SLR to > or = to 75 degrees to improve functional mobility    Time  3    Period  Weeks    Status  New    Target Date  04/27/19        PT Long Term Goals - 04/06/19 1151      PT LONG TERM GOAL #1   Title  be independent in advanced HEP    Time  6    Period  Weeks    Status  New    Target Date  05/18/19      PT LONG TERM GOAL #2   Title  reduce FOTO to < or = to 31% limitation    Time  6    Period  Weeks    Status  New    Target Date  05/18/19      PT LONG TERM GOAL #3   Title  demonstrate 4+/5 bil hip strength to improve functional endurance and improve safety with return to spin bike    Time  6    Period  Weeks    Status  New    Target Date  05/18/19      PT LONG TERM GOAL #4   Title  verbalize and demonstrate body mechanics modifications for lumbar protection with desk work and home tasks    Time  6    Period  Weeks    Status  New    Target Date  05/18/19      PT LONG TERM GOAL #5   Title  report a 60% reduction in Lt LE pain with sitting to improve tolerance for sitting for work and driving.    Time  6    Period  Weeks    Status  New    Target Date  05/18/19             Plan - 04/06/19 1318    Clinical Impression Statement  Pt presents to PT s/p lumbar fusion at L5-S1 performed 03/13/19.  Pt is wearing lumbar brace per MD orders and is to start to wean from the brace  25% each week starting in 2 weeks.  Pt is walking daily for exercise per MD orders.  Pt demonstrates Lt>Rt LE weakness and core weakness associated with post-op surgery and wear of lumbar brace. Pt with Lt>Rt hip stiffness and some reproduction of Lt LE radiculopathy with Lt hip ER.  Pt will benefit from skilled PT to improve functional strength and flexibility to allow for safe return to activity with good mechanics and independence.     Personal Factors and Comorbidities  Comorbidity 1    Comorbidities  lumbar fusion L5-S1    Examination-Activity Limitations  Dressing;Sit    Examination-Participation Restrictions  Community Activity;Driving    Stability/Clinical Decision Making  Stable/Uncomplicated    Clinical Decision Making  Low    Rehab Potential  Good  PT Frequency  2x / week    PT Duration  6 weeks    PT Treatment/Interventions  ADLs/Self Care Home Management;Cryotherapy;Electrical Stimulation;Moist Heat;Functional mobility training;Therapeutic activities;Therapeutic exercise;Neuromuscular re-education;Patient/family education;Manual techniques;Taping;Passive range of motion;Scar mobilization    PT Next Visit Plan  review HEP, gentle hip flexibility, core strength, body mechanics education    PT Home Exercise Plan  Access Code: [email or web address]    Consulted and Agree with Plan of Care  Patient       Patient will benefit from skilled therapeutic intervention in order to improve the following deficits and impairments:  Abnormal gait, Decreased activity tolerance, Decreased strength, Impaired flexibility, Postural dysfunction, Improper body mechanics, Decreased scar mobility, Decreased range of motion, Increased muscle spasms, Decreased endurance  Visit Diagnosis: Muscle weakness (generalized) - Plan: PT plan of care cert/re-cert  Chronic bilateral low back pain, unspecified whether sciatica present - Plan: PT plan of care cert/re-cert  Stiffness of left hip, not elsewhere classified - Plan: PT plan of care cert/re-cert  Stiffness of right hip, not elsewhere classified - Plan: PT plan of care cert/re-cert     Problem List Patient Active Problem List   Diagnosis Date Noted  . Spondylolisthesis, lumbar region 03/13/2019  . DDD (degenerative disc disease), lumbosacral   . Prediabetes   . Dyslipidemia    Lorrene Reid, PT 04/06/19 1:23 PM  [facility] [address] [place], [place], [address] Phone: [phone]   Fax:  [phone]  Name: Rick Maldonado MRN: [id number] Date of Birth: 09-Jan-1986

## 2019-04-13 ENCOUNTER — Other Ambulatory Visit: Payer: Self-pay

## 2019-04-13 ENCOUNTER — Ambulatory Visit: Payer: 59 | Admitting: Physical Therapy

## 2019-04-13 ENCOUNTER — Encounter: Payer: Self-pay | Admitting: Physical Therapy

## 2019-04-13 DIAGNOSIS — M6281 Muscle weakness (generalized): Secondary | ICD-10-CM

## 2019-04-13 DIAGNOSIS — G8929 Other chronic pain: Secondary | ICD-10-CM

## 2019-04-13 DIAGNOSIS — M25651 Stiffness of right hip, not elsewhere classified: Secondary | ICD-10-CM

## 2019-04-13 DIAGNOSIS — M25652 Stiffness of left hip, not elsewhere classified: Secondary | ICD-10-CM

## 2019-04-13 NOTE — Patient Instructions (Signed)
Access Code: [email or web address]  URL: [email or web address]  Date: 04/13/2019  Prepared by: Lavinia Sharps   Exercises Clamshell - 10 reps - 3 sets - 1x daily - 7x weekly Seated Transversus Abdominis Bracing - 10 reps - 3 sets - 1x daily - 7x weekly Standing Transverse Abdominis Contraction - 10 reps - 3 sets - 1x daily - 7x weekly Supine Transversus Abdominis Bracing - Hands on Ground - 10 reps - 3 sets - 1x daily - 7x weekly Supine Transversus Abdominis Bracing with Double Leg Fallout - 10 reps - 1 sets - 1x daily - 7x weekly Supine Hip Adduction Isometric with Ball - 10 reps - 1 sets - 1x daily - 7x weekly Supine March with Elevation - 10 reps - 1 sets - 1x daily - 7x weekly Supine Sciatic Nerve Glide - 8 reps - 1 sets - 1x daily - 7x weekly Standing Hip Hinge with Dowel - 5 reps - 1 sets - 1x daily - 7x weekly

## 2019-04-13 NOTE — Therapy (Signed)
[facility] [address] [place], [place], [address] Phone: [phone]   Fax:  [phone]  Physical Therapy Treatment  Patient Details  Name: Rick Maldonado MRN: [id number] Date of Birth: April 22, 1985 Referring Provider (PT): Shirlean Kelly, MD   Encounter Date: 04/13/2019  PT End of Session - 04/13/19 1130    Visit Number  2    Date for PT Re-Evaluation  05/18/19    PT Start Time  0935    PT Stop Time  1015    PT Time Calculation (min)  40 min    Activity Tolerance  Patient tolerated treatment well       Past Medical History:  Diagnosis Date  . Acid reflux   . Ankle pain   . Buttock pain   . DDD (degenerative disc disease), lumbosacral    bilateral neural foraminal stenosis at L5-S1, mass effect on B exiting L5 nerve roots (2/20)  . Dyslipidemia   . Hip pain   . Knee pain   . Leg pain   . Prediabetes   . Sleep apnea    cpap  . Spondylolysis     Past Surgical History:  Procedure Laterality Date  . ABDOMINAL EXPOSURE N/A 03/13/2019   Procedure: ABDOMINAL EXPOSURE;  Surgeon: Cephus Shelling, MD;  Location: [facility] OR;  Service: Vascular;  Laterality: N/A;  . ANTERIOR LUMBAR FUSION N/A 03/13/2019   Procedure: Lumbar five Sacral one Anterior lumbar interbody fusion with percutaneous posterior instrumentation;  Surgeon: Shirlean Kelly, MD;  Location: [facility];  Service: Neurosurgery;  Laterality: N/A;  . APPLICATION OF ROBOTIC ASSISTANCE FOR SPINAL PROCEDURE N/A 03/13/2019   Procedure: APPLICATION OF ROBOTIC ASSISTANCE FOR SPINAL PROCEDURE;  Surgeon: Shirlean Kelly, MD;  Location: [facility] OR;  Service: Neurosurgery;  Laterality: N/A;  . LUMBAR PERCUTANEOUS PEDICLE SCREW 1 LEVEL N/A 03/13/2019   Procedure: LUMBAR PERCUTANEOUS PEDICLE SCREW LUMBAR FIVE-SACRAL ONE LEVEL;  Surgeon: Shirlean Kelly, MD;  Location: [facility] OR;  Service: Neurosurgery;  Laterality: N/A;  . TONSILLECTOMY     as a child    There were no  vitals filed for this visit.  Subjective Assessment - 04/13/19 0940    Subjective  I didn' t sleep well.  The SLR ex is the one I have the most problem with.    Pertinent History  lumbar fusion L5-S1    Currently in Pain?  Yes    Pain Score  2     Pain Location  Back    Pain Orientation  Left    Pain Radiating Towards  Left thigh                       [facility] Adult PT Treatment/Exercise - 04/13/19 0001      Lumbar Exercises: Standing   Other Standing Lumbar Exercises  hip hinge with golf club 10x       Lumbar Exercises: Seated   Other Seated Lumbar Exercises  abdominal brace with and without foam roll push down 10x       Lumbar Exercises: Supine   Ab Set  10 reps    Clam  15 reps    Clam Limitations  single bent knee fallouts    Heel Slides  10 reps    Bent Knee Raise  10 reps    Bent Knee Raise Limitations  from elevated surface     Other Supine Lumbar Exercises  abdominal brace with ball squeeze 15x     Other  Supine Lumbar Exercises  sciatic on/off tensioning 10x right/left       Lumbar Exercises: Sidelying   Clam  Right;Left;15 reps             PT Education - 04/13/19 1009    Education Details  Access Code: [email or web address]  hold on supine SLR; supine neural floss; abdominal brace with bent knee fall outs, ball squeeze and march from elevated surface;  hip hinge with dowel    Person(s) Educated  Patient    Methods  Explanation;Demonstration;Handout    Comprehension  Returned demonstration;Verbalized understanding       PT Short Term Goals - 04/06/19 1150      PT SHORT TERM GOAL #1   Title  be independent in initial HEP    Time  3    Period  Weeks    Status  New    Target Date  04/27/19      PT SHORT TERM GOAL #2   Title  demonstrate Lt hamstring/SLR to > or = to 75 degrees to improve functional mobility    Time  3    Period  Weeks    Status  New    Target Date  04/27/19        PT Long Term Goals - 04/06/19 1151      PT LONG TERM GOAL #1    Title  be independent in advanced HEP    Time  6    Period  Weeks    Status  New    Target Date  05/18/19      PT LONG TERM GOAL #2   Title  reduce FOTO to < or = to 31% limitation    Time  6    Period  Weeks    Status  New    Target Date  05/18/19      PT LONG TERM GOAL #3   Title  demonstrate 4+/5 bil hip strength to improve functional endurance and improve safety with return to spin bike    Time  6    Period  Weeks    Status  New    Target Date  05/18/19      PT LONG TERM GOAL #4   Title  verbalize and demonstrate body mechanics modifications for lumbar protection with desk work and home tasks    Time  6    Period  Weeks    Status  New    Target Date  05/18/19      PT LONG TERM GOAL #5   Title  report a 60% reduction in Lt LE pain with sitting to improve tolerance for sitting for work and driving.    Time  6    Period  Weeks    Status  New    Target Date  05/18/19            Plan - 04/13/19 0955    Clinical Impression Statement  The patient reports mild to moderate discomfort today which he attributes to a poor night's sleep from sliding down in his adjustable bed.  He reports he has been walking a  few miles a day and we have discussed using his treadmill at a slower speed/no incline since it is supposed to be rainy/cold the next 4 days.  He is eager to get on his spin bike but we discussed holding off on the bike a bit longer.  He is able to activate transverse abdominus muscles fairly well with verbal cues to  avoid holding his breath.  He is able to initiate low level core strengthening in supine and sidelying without increase in low back pain.  Mild thigh symptoms increased with supine neural glide but dissipate upon rest.  Good hip hinge technique although difficult to hold head in line with the body.  Therapist monitoring response with all.    Examination-Participation Restrictions  Community Activity;Driving    Rehab Potential  Good    PT Frequency  2x / week     PT Duration  6 weeks    PT Treatment/Interventions  ADLs/Self Care Home Management;Cryotherapy;Electrical Stimulation;Moist Heat;Functional mobility training;Therapeutic activities;Therapeutic exercise;Neuromuscular re-education;Patient/family education;Manual techniques;Taping;Passive range of motion;Scar mobilization    PT Next Visit Plan  progress  HEP, gentle hip flexibility, core strength, body mechanics education    PT Home Exercise Plan  Access Code: [email or web address]       Patient will benefit from skilled therapeutic intervention in order to improve the following deficits and impairments:  Abnormal gait, Decreased activity tolerance, Decreased strength, Impaired flexibility, Postural dysfunction, Improper body mechanics, Decreased scar mobility, Decreased range of motion, Increased muscle spasms, Decreased endurance  Visit Diagnosis: Muscle weakness (generalized)  Chronic bilateral low back pain, unspecified whether sciatica present  Stiffness of left hip, not elsewhere classified  Stiffness of right hip, not elsewhere classified     Problem List Patient Active Problem List   Diagnosis Date Noted  . Spondylolisthesis, lumbar region 03/13/2019  . DDD (degenerative disc disease), lumbosacral   . Prediabetes   . Dyslipidemia    Lavinia Sharps, PT 04/13/19 11:37 AM Phone: [phone] Fax: [phone] Vivien Presto 04/13/2019, 11:36 AM  [facility] [address], [address] [place], [place], [address] Phone: [phone]   Fax:  [phone]  Name: Rick Maldonado MRN: [id number] Date of Birth: 1985/10/26

## 2019-04-19 ENCOUNTER — Ambulatory Visit: Payer: 59

## 2019-04-19 ENCOUNTER — Other Ambulatory Visit: Payer: Self-pay

## 2019-04-19 DIAGNOSIS — M6281 Muscle weakness (generalized): Secondary | ICD-10-CM

## 2019-04-19 DIAGNOSIS — M25652 Stiffness of left hip, not elsewhere classified: Secondary | ICD-10-CM

## 2019-04-19 DIAGNOSIS — M545 Low back pain, unspecified: Secondary | ICD-10-CM

## 2019-04-19 DIAGNOSIS — M25651 Stiffness of right hip, not elsewhere classified: Secondary | ICD-10-CM

## 2019-04-19 DIAGNOSIS — G8929 Other chronic pain: Secondary | ICD-10-CM

## 2019-04-19 NOTE — Therapy (Signed)
[facility] [address], [place], [place], [address] Phone: [phone]   Fax:  [phone]  Physical Therapy Treatment  Patient Details  Name: Rick Maldonado MRN: [id number] Date of Birth: Dec 28, 1985 Referring Provider (PT): Jovita Gamma, MD   Encounter Date: 04/19/2019  PT End of Session - 04/19/19 1005    Visit Number  3    Date for PT Re-Evaluation  05/18/19    Authorization Type  [employer]    Authorization - Visit Number  3    Authorization - Number of Visits  60    PT Start Time  0933    PT Stop Time  1013    PT Time Calculation (min)  40 min    Activity Tolerance  Patient tolerated treatment well    Behavior During Therapy  [facility] for tasks assessed/performed       Past Medical History:  Diagnosis Date  . Acid reflux   . Ankle pain   . Buttock pain   . DDD (degenerative disc disease), lumbosacral    bilateral neural foraminal stenosis at L5-S1, mass effect on B exiting L5 nerve roots (2/20)  . Dyslipidemia   . Hip pain   . Knee pain   . Leg pain   . Prediabetes   . Sleep apnea    cpap  . Spondylolysis     Past Surgical History:  Procedure Laterality Date  . ABDOMINAL EXPOSURE N/A 03/13/2019   Procedure: ABDOMINAL EXPOSURE;  Surgeon: Marty Heck, MD;  Location: [place];  Service: Vascular;  Laterality: N/A;  . ANTERIOR LUMBAR FUSION N/A 03/13/2019   Procedure: Lumbar five Sacral one Anterior lumbar interbody fusion with percutaneous posterior instrumentation;  Surgeon: Jovita Gamma, MD;  Location: [place];  Service: Neurosurgery;  Laterality: N/A;  . APPLICATION OF ROBOTIC ASSISTANCE FOR SPINAL PROCEDURE N/A 03/13/2019   Procedure: APPLICATION OF ROBOTIC ASSISTANCE FOR SPINAL PROCEDURE;  Surgeon: Jovita Gamma, MD;  Location: [place];  Service: Neurosurgery;  Laterality: N/A;  . LUMBAR PERCUTANEOUS PEDICLE SCREW 1 LEVEL N/A 03/13/2019   Procedure: LUMBAR PERCUTANEOUS PEDICLE SCREW LUMBAR  FIVE-SACRAL ONE LEVEL;  Surgeon: Jovita Gamma, MD;  Location: [place];  Service: Neurosurgery;  Laterality: N/A;  . TONSILLECTOMY     as a child    There were no vitals filed for this visit.  Subjective Assessment - 04/19/19 0937    Subjective  I was sore over the weekend due to hooking up my generator.    Currently in Pain?  No/denies   pain up to 3/10 over the weekend.                      [facility] Adult PT Treatment/Exercise - 04/19/19 0001      Exercises   Exercises  Lumbar;Knee/Hip      Lumbar Exercises: Stretches   Active Hamstring Stretch  Left;Right;3 reps;20 seconds      Lumbar Exercises: Aerobic   UBE (Upper Arm Bike)  Level 2x 6 minutes (3/3)    Nustep  Level 3x 8 minutes    PT present to discuss progress     Lumbar Exercises: Standing   Row  Strengthening;Both;20 reps;Theraband    Theraband Level (Row)  Level 2 (Red)    Row Limitations  standing on black pad    Shoulder Extension  Strengthening;Both;20 reps;Theraband    Theraband Level (Shoulder Extension)  Level 2 (Red)    Shoulder Extension Limitations  standing on balance pad  Lumbar Exercises: Seated   Other Seated Lumbar Exercises  abdominal brace with and without foam roll push down 10x       Lumbar Exercises: Supine   Ab Set  10 reps    Clam  15 reps    Clam Limitations  single bent knee fallouts    Other Supine Lumbar Exercises  abdominal brace with ball squeeze 15x     Other Supine Lumbar Exercises  sciatic on/off tensioning 2x10 right/left       Lumbar Exercises: Sidelying   Clam  Right;Left;15 reps               PT Short Term Goals - 04/19/19 0939      PT SHORT TERM GOAL #1   Title  be independent in initial HEP    Status  Achieved      PT SHORT TERM GOAL #2   Title  demonstrate Lt hamstring/SLR to > or = to 75 degrees to improve functional mobility    Time  3    Period  Weeks    Status  On-going        PT Long Term Goals - 04/19/19 0945      PT LONG  TERM GOAL #4   Title  verbalize and demonstrate body mechanics modifications for lumbar protection with desk work and home tasks    Status  Achieved            Plan - 04/19/19 0952    Clinical Impression Statement  Pt is continue to do walking and exercises at home for strength.  Pt will begin to wean from the brace this week per MD orders and PT discussed how to do this.  PT educated pt regarding body mechanics modifications for lumbar protections with daily activity.  Pt continues to work on control of abdominal stabilizers and requires verbal cues throughout session for activation.  Pt tolerated endurance based exercise today well today. Pt will continue to benefit from skilled PT to address core strength, flexibility and endurance s/p lumbar fusion.    Rehab Potential  Good    PT Frequency  2x / week    PT Duration  6 weeks    PT Treatment/Interventions  ADLs/Self Care Home Management;Cryotherapy;Electrical Stimulation;Moist Heat;Functional mobility training;Therapeutic activities;Therapeutic exercise;Neuromuscular re-education;Patient/family education;Manual techniques;Taping;Passive range of motion;Scar mobilization    PT Next Visit Plan  progress  HEP, gentle hip flexibility, core strength, endurance exercise    PT Home Exercise Plan  Access Code: [email or web address]    Consulted and Agree with Plan of Care  Patient       Patient will benefit from skilled therapeutic intervention in order to improve the following deficits and impairments:  Abnormal gait, Decreased activity tolerance, Decreased strength, Impaired flexibility, Postural dysfunction, Improper body mechanics, Decreased scar mobility, Decreased range of motion, Increased muscle spasms, Decreased endurance  Visit Diagnosis: Muscle weakness (generalized)  Chronic bilateral low back pain, unspecified whether sciatica present  Stiffness of left hip, not elsewhere classified  Stiffness of right hip, not elsewhere  classified     Problem List Patient Active Problem List   Diagnosis Date Noted  . Spondylolisthesis, lumbar region 03/13/2019  . DDD (degenerative disc disease), lumbosacral   . Prediabetes   . Dyslipidemia      Lorrene Reid, PT 04/19/19 10:07 AM  [facility] [address] [place], [place], [address] Phone: [phone]   Fax:  [phone]  Name: Rick Maldonado MRN: [id number] Date  of Birth: October 27, 1985

## 2019-04-21 ENCOUNTER — Other Ambulatory Visit: Payer: Self-pay

## 2019-04-21 ENCOUNTER — Ambulatory Visit: Payer: 59 | Admitting: Physical Therapy

## 2019-04-21 ENCOUNTER — Encounter: Payer: Self-pay | Admitting: Physical Therapy

## 2019-04-21 DIAGNOSIS — G8929 Other chronic pain: Secondary | ICD-10-CM

## 2019-04-21 DIAGNOSIS — M25652 Stiffness of left hip, not elsewhere classified: Secondary | ICD-10-CM

## 2019-04-21 DIAGNOSIS — M545 Low back pain, unspecified: Secondary | ICD-10-CM

## 2019-04-21 DIAGNOSIS — M25651 Stiffness of right hip, not elsewhere classified: Secondary | ICD-10-CM

## 2019-04-21 DIAGNOSIS — M6281 Muscle weakness (generalized): Secondary | ICD-10-CM | POA: Diagnosis not present

## 2019-04-21 NOTE — Therapy (Signed)
[facility] [address], [address] [place], [place], [address] Phone: [phone]   Fax:  [phone]  Physical Therapy Treatment  Patient Details  Name: Rick Maldonado MRN: [id number] Date of Birth: 08-14-1985 Referring Provider (PT): Shirlean Kelly, MD   Encounter Date: 04/21/2019  PT End of Session - 04/21/19 1155    Visit Number  4    Date for PT Re-Evaluation  05/18/19    Authorization Type  [employer]    Authorization - Visit Number  4    Authorization - Number of Visits  60    PT Start Time  0847    PT Stop Time  0931    PT Time Calculation (min)  44 min    Activity Tolerance  Patient tolerated treatment well       Past Medical History:  Diagnosis Date  . Acid reflux   . Ankle pain   . Buttock pain   . DDD (degenerative disc disease), lumbosacral    bilateral neural foraminal stenosis at L5-S1, mass effect on B exiting L5 nerve roots (2/20)  . Dyslipidemia   . Hip pain   . Knee pain   . Leg pain   . Prediabetes   . Sleep apnea    cpap  . Spondylolysis     Past Surgical History:  Procedure Laterality Date  . ABDOMINAL EXPOSURE N/A 03/13/2019   Procedure: ABDOMINAL EXPOSURE;  Surgeon: Cephus Shelling, MD;  Location: [facility] OR;  Service: Vascular;  Laterality: N/A;  . ANTERIOR LUMBAR FUSION N/A 03/13/2019   Procedure: Lumbar five Sacral one Anterior lumbar interbody fusion with percutaneous posterior instrumentation;  Surgeon: Shirlean Kelly, MD;  Location: [facility] OR;  Service: Neurosurgery;  Laterality: N/A;  . APPLICATION OF ROBOTIC ASSISTANCE FOR SPINAL PROCEDURE N/A 03/13/2019   Procedure: APPLICATION OF ROBOTIC ASSISTANCE FOR SPINAL PROCEDURE;  Surgeon: Shirlean Kelly, MD;  Location: [facility] OR;  Service: Neurosurgery;  Laterality: N/A;  . LUMBAR PERCUTANEOUS PEDICLE SCREW 1 LEVEL N/A 03/13/2019   Procedure: LUMBAR PERCUTANEOUS PEDICLE SCREW LUMBAR FIVE-SACRAL ONE LEVEL;  Surgeon: Shirlean Kelly, MD;   Location: [facility] OR;  Service: Neurosurgery;  Laterality: N/A;  . TONSILLECTOMY     as a child    There were no vitals filed for this visit.  Subjective Assessment - 04/21/19 0850    Subjective  I've had 2 good days.  Presents without brace this morning.  Reports he typically does not go more than an hour without brace.  Sitting more than 15 minutes would be painful.  Standing to cook dinner is difficult.   I had some leg soreness/difficulty rising from the squatting position after last session from exercise.  I have to break up the exercises at home secondary to muscular fatigue.    Pertinent History  lumbar fusion L5-S1    How long can you sit comfortably?  15 minutes, begin to have pain in Lt LE    Patient Stated Goals  improve strength, flexibility, wean from lumbar brace per MD orders    Currently in Pain?  No/denies    Pain Score  0-No pain    Pain Location  Back    Pain Type  Surgical pain                       [facility] Adult PT Treatment/Exercise - 04/21/19 0001      Therapeutic Activites    Other Therapeutic Activities  on/off the floor transfer to ensure safe  movement; on/off the bed/quadruped      Lumbar Exercises: Stretches   Other Lumbar Stretch Exercise  seated thoracic extension over chair with lumbar supported by the back of the chair 10x    Other Lumbar Stretch Exercise  discussed hip hinge seated with elbows on table for thoracic stretch       Lumbar Exercises: Aerobic   Nustep  Level 5 x 8 minutes    PT present to discuss progress     Lumbar Exercises: Standing   Heel Raises  15 reps    Heel Raises Limitations  and toes     Row  Strengthening;Both;20 reps;Theraband    Theraband Level (Row)  Level 2 (Red)    Row Limitations  standing on black pad    Shoulder Extension  Strengthening;Both;20 reps;Theraband    Theraband Level (Shoulder Extension)  Level 2 (Red)    Shoulder Extension Limitations  standing on black pad     Other Standing Lumbar Exercises   staggered stand/back foot as a "kickstand" with red band 2 handed press forward 10x right/left     Other Standing Lumbar Exercises  step ups 15x right/left       Lumbar Exercises: Supine   Ab Set  10 reps      Lumbar Exercises: Sidelying   Clam  Right;Left;15 reps    Clam Limitations  red band       Lumbar Exercises: Quadruped   Single Arm Raise  Right;Left;10 reps               PT Short Term Goals - 04/19/19 0939      PT SHORT TERM GOAL #1   Title  be independent in initial HEP    Status  Achieved      PT SHORT TERM GOAL #2   Title  demonstrate Lt hamstring/SLR to > or = to 75 degrees to improve functional mobility    Time  3    Period  Weeks    Status  On-going        PT Long Term Goals - 04/19/19 0945      PT LONG TERM GOAL #4   Title  verbalize and demonstrate body mechanics modifications for lumbar protection with desk work and home tasks    Status  Achieved            Plan - 04/21/19 1156    Clinical Impression Statement  The patient continues to make steady progress with rehab.  He demonstrates good compliance and carryover with HEP although he has fairly quick muscular fatigue.  Good transverse abdominus muscle activation in supine, quadruped and standing for shorter durations.  Good body mechanics technique with getting on/off the floor.  Therapist monitoring closely with all interventions.    Rehab Potential  Good    PT Frequency  2x / week    PT Duration  6 weeks    PT Treatment/Interventions  ADLs/Self Care Home Management;Cryotherapy;Electrical Stimulation;Moist Heat;Functional mobility training;Therapeutic activities;Therapeutic exercise;Neuromuscular re-education;Patient/family education;Manual techniques;Taping;Passive range of motion;Scar mobilization    PT Next Visit Plan  progress  HEP, gentle hip flexibility, core strength, endurance exercise;  requests stretches for upper back/shoulder blade region;  has 5#, 10# weights at home but  discussed waiting until PT advises before using    PT Home Exercise Plan  Access Code: [email or web address]       Patient will benefit from skilled therapeutic intervention in order to improve the following deficits and impairments:  Abnormal gait, Decreased activity tolerance,  Decreased strength, Impaired flexibility, Postural dysfunction, Improper body mechanics, Decreased scar mobility, Decreased range of motion, Increased muscle spasms, Decreased endurance  Visit Diagnosis: Muscle weakness (generalized)  Chronic bilateral low back pain, unspecified whether sciatica present  Stiffness of left hip, not elsewhere classified  Stiffness of right hip, not elsewhere classified     Problem List Patient Active Problem List   Diagnosis Date Noted  . Spondylolisthesis, lumbar region 03/13/2019  . DDD (degenerative disc disease), lumbosacral   . Prediabetes   . Dyslipidemia    Ruben Im, PT 04/21/19 12:05 PM Phone: [phone] Fax: [phone] Alvera Singh 04/21/2019, 12:05 PM  [facility] [address] [place], [place], [address] Phone: [phone]   Fax:  [phone]  Name: KASTIN CERDA MRN: [id number] Date of Birth: 1985/08/31

## 2019-04-24 ENCOUNTER — Other Ambulatory Visit: Payer: Self-pay

## 2019-04-24 ENCOUNTER — Ambulatory Visit: Payer: 59

## 2019-04-24 DIAGNOSIS — G8929 Other chronic pain: Secondary | ICD-10-CM

## 2019-04-24 DIAGNOSIS — M6281 Muscle weakness (generalized): Secondary | ICD-10-CM

## 2019-04-24 DIAGNOSIS — M25651 Stiffness of right hip, not elsewhere classified: Secondary | ICD-10-CM

## 2019-04-24 DIAGNOSIS — M25652 Stiffness of left hip, not elsewhere classified: Secondary | ICD-10-CM

## 2019-04-24 NOTE — Therapy (Signed)
[facility] [address] [place], [place], [address] Phone: [phone]   Fax:  [phone]  Physical Therapy Treatment  Patient Details  Name: Rick Maldonado MRN: [id number] Date of Birth: 1986/01/08 Referring Provider (PT): Shirlean Jashawna Reever, MD   Encounter Date: 04/24/2019  PT End of Session - 04/24/19 0923    Visit Number  5    Date for PT Re-Evaluation  05/18/19    Authorization Type  [employer]    Authorization - Visit Number  5    Authorization - Number of Visits  60    PT Start Time  0847    PT Stop Time  0928    PT Time Calculation (min)  41 min    Activity Tolerance  Patient tolerated treatment well    Behavior During Therapy  [facility] for tasks assessed/performed       Past Medical History:  Diagnosis Date  . Acid reflux   . Ankle pain   . Buttock pain   . DDD (degenerative disc disease), lumbosacral    bilateral neural foraminal stenosis at L5-S1, mass effect on B exiting L5 nerve roots (2/20)  . Dyslipidemia   . Hip pain   . Knee pain   . Leg pain   . Prediabetes   . Sleep apnea    cpap  . Spondylolysis     Past Surgical History:  Procedure Laterality Date  . ABDOMINAL EXPOSURE N/A 03/13/2019   Procedure: ABDOMINAL EXPOSURE;  Surgeon: Cephus Shelling, MD;  Location: [facility] OR;  Service: Vascular;  Laterality: N/A;  . ANTERIOR LUMBAR FUSION N/A 03/13/2019   Procedure: Lumbar five Sacral one Anterior lumbar interbody fusion with percutaneous posterior instrumentation;  Surgeon: Shirlean Yohannes Waibel, MD;  Location: [facility] OR;  Service: Neurosurgery;  Laterality: N/A;  . APPLICATION OF ROBOTIC ASSISTANCE FOR SPINAL PROCEDURE N/A 03/13/2019   Procedure: APPLICATION OF ROBOTIC ASSISTANCE FOR SPINAL PROCEDURE;  Surgeon: Shirlean Sanjit Mcmichael, MD;  Location: [facility] OR;  Service: Neurosurgery;  Laterality: N/A;  . LUMBAR PERCUTANEOUS PEDICLE SCREW 1 LEVEL N/A 03/13/2019   Procedure: LUMBAR PERCUTANEOUS PEDICLE SCREW LUMBAR  FIVE-SACRAL ONE LEVEL;  Surgeon: Shirlean Melah Ebling, MD;  Location: [facility] OR;  Service: Neurosurgery;  Laterality: N/A;  . TONSILLECTOMY     as a child    There were no vitals filed for this visit.                    [facility] Adult PT Treatment/Exercise - 04/24/19 0001      Exercises   Exercises  Shoulder      Lumbar Exercises: Aerobic   UBE (Upper Arm Bike)  Level 2x 6 minutes (3/3)    Nustep  Level 5 x 8 minutes    PT present to discuss progress     Lumbar Exercises: Standing   Heel Raises  15 reps    Heel Raises Limitations  and toes     Row  --    Theraband Level (Row)  --    Row Limitations  --    Shoulder Extension  --    Theraband Level (Shoulder Extension)  --    Shoulder Extension Limitations  --    Other Standing Lumbar Exercises  standing on black pad: red theraband horizontal abduction and D2 2x10      Knee/Hip Exercises: Standing   Rebounder  weight shifting 3 ways x1 minute each      Knee/Hip Exercises: Seated   Sit to Sand  20  reps;without UE support   abdominal bracing     Shoulder Exercises: Seated   Flexion  Strengthening;Both;20 reps    Flexion Weight (lbs)  5#             PT Education - 04/24/19 0960    Education Details  use of weights and incline bench at home- how to do this safely.  Low weights, low reps to start    Person(s) Educated  Patient    Methods  Explanation;Demonstration    Comprehension  Verbalized understanding       PT Short Term Goals - 04/19/19 0939      PT SHORT TERM GOAL #1   Title  be independent in initial HEP    Status  Achieved      PT SHORT TERM GOAL #2   Title  demonstrate Lt hamstring/SLR to > or = to 75 degrees to improve functional mobility    Time  3    Period  Weeks    Status  On-going        PT Long Term Goals - 04/24/19 0854      PT LONG TERM GOAL #4   Title  verbalize and demonstrate body mechanics modifications for lumbar protection with desk work and home tasks    Status   Achieved            Plan - 04/24/19 0900    Clinical Impression Statement  Pt continues to work on core strength and endurance s/p lumbar surgery.  Pt demonstrates good compliance with HEP and reports some soreness after each session.  Pt demonstrates good transverse abdominus muscle activation in supine, quadruped and standing for shorter durations.  Pt continues to wean from the brace at home and reports 50% use at this time.  PT was present to monitor all exercises and provide verbal cues for technique.  Pt will continue to benefit from skilled PT to address strength and flexibility s/p surgery.    PT Frequency  2x / week    PT Duration  6 weeks    PT Treatment/Interventions  ADLs/Self Care Home Management;Cryotherapy;Electrical Stimulation;Moist Heat;Functional mobility training;Therapeutic activities;Therapeutic exercise;Neuromuscular re-education;Patient/family education;Manual techniques;Taping;Passive range of motion;Scar mobilization    PT Next Visit Plan  continue core and LE functional strength with emphasis on alignment.    PT Home Exercise Plan  Access Code: [email or web address]    Consulted and Agree with Plan of Care  Patient       Patient will benefit from skilled therapeutic intervention in order to improve the following deficits and impairments:  Abnormal gait, Decreased activity tolerance, Decreased strength, Impaired flexibility, Postural dysfunction, Improper body mechanics, Decreased scar mobility, Decreased range of motion, Increased muscle spasms, Decreased endurance  Visit Diagnosis: Muscle weakness (generalized)  Chronic bilateral low back pain, unspecified whether sciatica present  Stiffness of left hip, not elsewhere classified  Stiffness of right hip, not elsewhere classified     Problem List Patient Active Problem List   Diagnosis Date Noted  . Spondylolisthesis, lumbar region 03/13/2019  . DDD (degenerative disc disease), lumbosacral   . Prediabetes   .  Dyslipidemia      Sigurd Sos, PT 04/24/19 9:25 AM  [facility] [address], [place], [place], [address] Phone: [phone]   Fax:  [phone]  Name: Rick Maldonado MRN: [id number] Date of Birth: Nov 21, 1985

## 2019-04-26 ENCOUNTER — Other Ambulatory Visit: Payer: Self-pay

## 2019-04-26 ENCOUNTER — Ambulatory Visit: Payer: 59

## 2019-04-26 DIAGNOSIS — M25651 Stiffness of right hip, not elsewhere classified: Secondary | ICD-10-CM

## 2019-04-26 DIAGNOSIS — M6281 Muscle weakness (generalized): Secondary | ICD-10-CM | POA: Diagnosis not present

## 2019-04-26 DIAGNOSIS — M25652 Stiffness of left hip, not elsewhere classified: Secondary | ICD-10-CM

## 2019-04-26 DIAGNOSIS — M545 Low back pain, unspecified: Secondary | ICD-10-CM

## 2019-04-26 DIAGNOSIS — G8929 Other chronic pain: Secondary | ICD-10-CM

## 2019-04-26 NOTE — Therapy (Signed)
[facility] [address] [place], [place], [address] Phone: [phone]   Fax:  [phone]  Physical Therapy Treatment  Patient Details  Name: Rick Maldonado MRN: [id number] Date of Birth: 11/19/1985 Referring Provider (PT): Jovita Gamma, MD   Encounter Date: 04/26/2019  PT End of Session - 04/26/19 1219    Visit Number  6    Date for PT Re-Evaluation  05/18/19    Authorization Type  [employer]    Authorization - Visit Number  6    Authorization - Number of Visits  60    PT Start Time  1146    PT Stop Time  1229    PT Time Calculation (min)  43 min    Activity Tolerance  Patient tolerated treatment well    Behavior During Therapy  [facility] for tasks assessed/performed       Past Medical History:  Diagnosis Date  . Acid reflux   . Ankle pain   . Buttock pain   . DDD (degenerative disc disease), lumbosacral    bilateral neural foraminal stenosis at L5-S1, mass effect on B exiting L5 nerve roots (2/20)  . Dyslipidemia   . Hip pain   . Knee pain   . Leg pain   . Prediabetes   . Sleep apnea    cpap  . Spondylolysis     Past Surgical History:  Procedure Laterality Date  . ABDOMINAL EXPOSURE N/A 03/13/2019   Procedure: ABDOMINAL EXPOSURE;  Surgeon: Marty Heck, MD;  Location: [place];  Service: Vascular;  Laterality: N/A;  . ANTERIOR LUMBAR FUSION N/A 03/13/2019   Procedure: Lumbar five Sacral one Anterior lumbar interbody fusion with percutaneous posterior instrumentation;  Surgeon: Jovita Gamma, MD;  Location: [place];  Service: Neurosurgery;  Laterality: N/A;  . APPLICATION OF ROBOTIC ASSISTANCE FOR SPINAL PROCEDURE N/A 03/13/2019   Procedure: APPLICATION OF ROBOTIC ASSISTANCE FOR SPINAL PROCEDURE;  Surgeon: Jovita Gamma, MD;  Location: [place];  Service: Neurosurgery;  Laterality: N/A;  . LUMBAR PERCUTANEOUS PEDICLE SCREW 1 LEVEL N/A 03/13/2019   Procedure: LUMBAR PERCUTANEOUS PEDICLE SCREW LUMBAR  FIVE-SACRAL ONE LEVEL;  Surgeon: Jovita Gamma, MD;  Location: [place];  Service: Neurosurgery;  Laterality: N/A;  . TONSILLECTOMY     as a child    There were no vitals filed for this visit.  Subjective Assessment - 04/26/19 1150    Subjective  I am spending 50% of the time out of my brace.  I had a bad day yesterday with incresead "pinching". 4/10 pain reported.    Currently in Pain?  No/denies                       [facility] Adult PT Treatment/Exercise - 04/26/19 0001      Lumbar Exercises: Aerobic   Stationary Bike  Level 4 x 6 minutes     UBE (Upper Arm Bike)  Level 2x 6 minutes (3/3)    Nustep  --      Lumbar Exercises: Standing   Other Standing Lumbar Exercises  standing on black pad: red theraband horizontal abduction and D2 2x10      Knee/Hip Exercises: Standing   Rocker Board  3 minutes    Rebounder  weight shifting 3 ways x1 minute each      Knee/Hip Exercises: Seated   Sit to Sand  20 reps;without UE support   abdominal bracing     Shoulder Exercises: Seated   Flexion  Strengthening;Both;20  reps;Weights    Flexion Weight (lbs)  3#    Abduction  Strengthening;Both;20 reps;Weights    ABduction Weight (lbs)  3#    Diagonals  Strengthening;20 reps;Weights    Diagonals Weight (lbs)  3#             PT Education - 04/26/19 1204    Education Details  workstation set-up, change of position during the work day    Person(s) Educated  Patient    Methods  Explanation    Comprehension  Verbalized understanding       PT Short Term Goals - 04/19/19 0939      PT SHORT TERM GOAL #1   Title  be independent in initial HEP    Status  Achieved      PT SHORT TERM GOAL #2   Title  demonstrate Lt hamstring/SLR to > or = to 75 degrees to improve functional mobility    Time  3    Period  Weeks    Status  On-going        PT Long Term Goals - 04/24/19 0854      PT LONG TERM GOAL #4   Title  verbalize and demonstrate body mechanics modifications for  lumbar protection with desk work and home tasks    Status  Achieved            Plan - 04/26/19 1202    Clinical Impression Statement  Pt continues to work on core strength and endurance s/p lumbar surgery.  Pt demonstrates good compliance with HEP and reports some soreness after each session.   Pt continues to wean from the brace at home and reports 50% use at this time.  Pt is working from his bed and PT discussed some workstation modifications to allow for alignment and frequent change of position. PT was present to monitor all exercises and provide verbal cues for technique.  Pt will continue to benefit from skilled PT to address strength and flexibility s/p surgery.    PT Frequency  2x / week    PT Duration  6 weeks    PT Treatment/Interventions  ADLs/Self Care Home Management;Cryotherapy;Electrical Stimulation;Moist Heat;Functional mobility training;Therapeutic activities;Therapeutic exercise;Neuromuscular re-education;Patient/family education;Manual techniques;Taping;Passive range of motion;Scar mobilization    PT Next Visit Plan  continue core and LE functional strength with emphasis on alignment.    PT Home Exercise Plan  Access Code: [email or web address]    Consulted and Agree with Plan of Care  Patient       Patient will benefit from skilled therapeutic intervention in order to improve the following deficits and impairments:  Abnormal gait, Decreased activity tolerance, Decreased strength, Impaired flexibility, Postural dysfunction, Improper body mechanics, Decreased scar mobility, Decreased range of motion, Increased muscle spasms, Decreased endurance  Visit Diagnosis: Muscle weakness (generalized)  Chronic bilateral low back pain, unspecified whether sciatica present  Stiffness of left hip, not elsewhere classified  Stiffness of right hip, not elsewhere classified     Problem List Patient Active Problem List   Diagnosis Date Noted  . Spondylolisthesis, lumbar region 03/13/2019   . DDD (degenerative disc disease), lumbosacral   . Prediabetes   . Dyslipidemia      Lorrene Reid, PT 04/26/19 12:25 PM  [facility] [address] [place], [place], [address] Phone: [phone]   Fax:  [phone]  Name: Rick Maldonado MRN: [id number] Date of Birth: Jan 06, 1986

## 2019-05-01 ENCOUNTER — Other Ambulatory Visit: Payer: Self-pay

## 2019-05-01 ENCOUNTER — Ambulatory Visit: Payer: 59 | Attending: Neurosurgery

## 2019-05-01 DIAGNOSIS — M6281 Muscle weakness (generalized): Secondary | ICD-10-CM | POA: Insufficient documentation

## 2019-05-01 DIAGNOSIS — M25651 Stiffness of right hip, not elsewhere classified: Secondary | ICD-10-CM | POA: Diagnosis present

## 2019-05-01 DIAGNOSIS — G8929 Other chronic pain: Secondary | ICD-10-CM | POA: Diagnosis present

## 2019-05-01 DIAGNOSIS — M545 Low back pain: Secondary | ICD-10-CM | POA: Diagnosis present

## 2019-05-01 DIAGNOSIS — M25652 Stiffness of left hip, not elsewhere classified: Secondary | ICD-10-CM | POA: Insufficient documentation

## 2019-05-01 NOTE — Therapy (Signed)
[facility] [address] [place], [place], [address] Phone: [phone]   Fax:  [phone]  Physical Therapy Treatment  Patient Details  Name: Rick Maldonado MRN: [id number] Date of Birth: 12/15/1985 Referring Provider (PT): Shirlean Sophee Mckimmy, MD   Encounter Date: 05/01/2019  PT End of Session - 05/01/19 1056    Visit Number  7    Date for PT Re-Evaluation  05/18/19    Authorization Type  [employer]    Authorization - Visit Number  7    Authorization - Number of Visits  60    PT Start Time  1016    PT Stop Time  1100    PT Time Calculation (min)  44 min    Activity Tolerance  Patient tolerated treatment well    Behavior During Therapy  [facility] for tasks assessed/performed       Past Medical History:  Diagnosis Date  . Acid reflux   . Ankle pain   . Buttock pain   . DDD (degenerative disc disease), lumbosacral    bilateral neural foraminal stenosis at L5-S1, mass effect on B exiting L5 nerve roots (2/20)  . Dyslipidemia   . Hip pain   . Knee pain   . Leg pain   . Prediabetes   . Sleep apnea    cpap  . Spondylolysis     Past Surgical History:  Procedure Laterality Date  . ABDOMINAL EXPOSURE N/A 03/13/2019   Procedure: ABDOMINAL EXPOSURE;  Surgeon: Cephus Shelling, MD;  Location: [facility] OR;  Service: Vascular;  Laterality: N/A;  . ANTERIOR LUMBAR FUSION N/A 03/13/2019   Procedure: Lumbar five Sacral one Anterior lumbar interbody fusion with percutaneous posterior instrumentation;  Surgeon: Shirlean Braden Deloach, MD;  Location: [facility] OR;  Service: Neurosurgery;  Laterality: N/A;  . APPLICATION OF ROBOTIC ASSISTANCE FOR SPINAL PROCEDURE N/A 03/13/2019   Procedure: APPLICATION OF ROBOTIC ASSISTANCE FOR SPINAL PROCEDURE;  Surgeon: Shirlean Tovah Slavick, MD;  Location: [facility] OR;  Service: Neurosurgery;  Laterality: N/A;  . LUMBAR PERCUTANEOUS PEDICLE SCREW 1 LEVEL N/A 03/13/2019   Procedure: LUMBAR PERCUTANEOUS PEDICLE SCREW LUMBAR  FIVE-SACRAL ONE LEVEL;  Surgeon: Shirlean Huan Pollok, MD;  Location: [facility] OR;  Service: Neurosurgery;  Laterality: N/A;  . TONSILLECTOMY     as a child    There were no vitals filed for this visit.  Subjective Assessment - 05/01/19 1022    Subjective  I have had pain at the top of my Lt ankle- this is where I had pain before surgery. I  think i overstretched my calves.    Patient Stated Goals  improve strength, flexibility, wean from lumbar brace per MD orders    Currently in Pain?  Yes    Pain Location  Leg    Pain Orientation  Left    Pain Descriptors / Indicators  Sharp;Aching    Pain Type  Acute pain    Pain Onset  More than a month ago    Pain Frequency  Constant    Aggravating Factors   standing on Lt LE    Pain Relieving Factors  supine                       [facility] Adult PT Treatment/Exercise - 05/01/19 0001      Lumbar Exercises: Aerobic   UBE (Upper Arm Bike)  Level 2x 6 minutes (3/3)    Nustep  Level 5 x 8 minutes       Lumbar Exercises:  Standing   Other Standing Lumbar Exercises  standing on black pad: yellow ball press outs, diagonals 2x10 bil each      Lumbar Exercises: Sidelying   Clam  Both;20 reps      Knee/Hip Exercises: Standing   Rebounder  weight shifting 3 ways x1 minute each      Knee/Hip Exercises: Supine   Other Supine Knee/Hip Exercises  nerve flossing on Lt 3x5 reps      Shoulder Exercises: Seated   Flexion  Strengthening;Both;20 reps;Weights    Flexion Weight (lbs)  3#   on red ball   Abduction  Strengthening;Both;20 reps;Weights    ABduction Weight (lbs)  3#   on red ball   Diagonals  Strengthening;20 reps;Weights    Diagonals Weight (lbs)  3#   on red ball              PT Short Term Goals - 04/19/19 0939      PT SHORT TERM GOAL #1   Title  be independent in initial HEP    Status  Achieved      PT SHORT TERM GOAL #2   Title  demonstrate Lt hamstring/SLR to > or = to 75 degrees to improve functional mobility     Time  3    Period  Weeks    Status  On-going        PT Long Term Goals - 04/24/19 0854      PT LONG TERM GOAL #4   Title  verbalize and demonstrate body mechanics modifications for lumbar protection with desk work and home tasks    Status  Achieved            Plan - 05/01/19 1040    Clinical Impression Statement  Pt with Lt anterior ankle pain over the weekend.  He has been icing his low back and performing nerve glides and pain has reduced each day.  Pt continues to work on core and hip strength and functional mobility endurance.  Pt tolerated advancement of 3 way raises to sitting on red ball to improve core activation.  Pt required supervision for technique with exercises.  Pt will continue to benefit from skilled PT to address strength and flexibility s/p lumbar fusion.    Rehab Potential  Good    PT Frequency  2x / week    PT Duration  6 weeks    PT Treatment/Interventions  ADLs/Self Care Home Management;Cryotherapy;Electrical Stimulation;Moist Heat;Functional mobility training;Therapeutic activities;Therapeutic exercise;Neuromuscular re-education;Patient/family education;Manual techniques;Taping;Passive range of motion;Scar mobilization    PT Next Visit Plan  continue core and LE functional strength with emphasis on alignment.    PT Home Exercise Plan  Access Code: [email or web address]    Consulted and Agree with Plan of Care  Patient       Patient will benefit from skilled therapeutic intervention in order to improve the following deficits and impairments:  Abnormal gait, Decreased activity tolerance, Decreased strength, Impaired flexibility, Postural dysfunction, Improper body mechanics, Decreased scar mobility, Decreased range of motion, Increased muscle spasms, Decreased endurance  Visit Diagnosis: Muscle weakness (generalized)  Chronic bilateral low back pain, unspecified whether sciatica present  Stiffness of left hip, not elsewhere classified  Stiffness of right hip, not  elsewhere classified     Problem List Patient Active Problem List   Diagnosis Date Noted  . Spondylolisthesis, lumbar region 03/13/2019  . DDD (degenerative disc disease), lumbosacral   . Prediabetes   . Dyslipidemia     Lorrene Reid, PT 05/01/19 10:58  AM  [facility] [address] [place], [place], [address] Phone: [phone]   Fax:  [phone]  Name: KHADAR MONGER MRN: [id number] Date of Birth: April 11, 1985

## 2019-05-03 ENCOUNTER — Other Ambulatory Visit: Payer: Self-pay

## 2019-05-03 ENCOUNTER — Ambulatory Visit: Payer: 59

## 2019-05-03 DIAGNOSIS — G8929 Other chronic pain: Secondary | ICD-10-CM

## 2019-05-03 DIAGNOSIS — M25652 Stiffness of left hip, not elsewhere classified: Secondary | ICD-10-CM

## 2019-05-03 DIAGNOSIS — M6281 Muscle weakness (generalized): Secondary | ICD-10-CM | POA: Diagnosis not present

## 2019-05-03 DIAGNOSIS — M25651 Stiffness of right hip, not elsewhere classified: Secondary | ICD-10-CM

## 2019-05-03 NOTE — Therapy (Signed)
[facility] [address] [place], [place], [address] Phone: [phone]   Fax:  [phone]  Physical Therapy Treatment  Patient Details  Name: Rick Maldonado MRN: [id number] Date of Birth: 1985-04-23 Referring Provider (PT): Shirlean Altair Stanko, MD   Encounter Date: 05/03/2019  PT End of Session - 05/03/19 1007    Visit Number  8    Date for PT Re-Evaluation  05/18/19    Authorization Type  [employer]    Authorization - Visit Number  8    Authorization - Number of Visits  60    PT Start Time  0931    PT Stop Time  1013    PT Time Calculation (min)  42 min    Activity Tolerance  Patient tolerated treatment well    Behavior During Therapy  [facility] for tasks assessed/performed       Past Medical History:  Diagnosis Date  . Acid reflux   . Ankle pain   . Buttock pain   . DDD (degenerative disc disease), lumbosacral    bilateral neural foraminal stenosis at L5-S1, mass effect on B exiting L5 nerve roots (2/20)  . Dyslipidemia   . Hip pain   . Knee pain   . Leg pain   . Prediabetes   . Sleep apnea    cpap  . Spondylolysis     Past Surgical History:  Procedure Laterality Date  . ABDOMINAL EXPOSURE N/A 03/13/2019   Procedure: ABDOMINAL EXPOSURE;  Surgeon: Cephus Shelling, MD;  Location: [facility] OR;  Service: Vascular;  Laterality: N/A;  . ANTERIOR LUMBAR FUSION N/A 03/13/2019   Procedure: Lumbar five Sacral one Anterior lumbar interbody fusion with percutaneous posterior instrumentation;  Surgeon: Shirlean Daliah Chaudoin, MD;  Location: [facility] OR;  Service: Neurosurgery;  Laterality: N/A;  . APPLICATION OF ROBOTIC ASSISTANCE FOR SPINAL PROCEDURE N/A 03/13/2019   Procedure: APPLICATION OF ROBOTIC ASSISTANCE FOR SPINAL PROCEDURE;  Surgeon: Shirlean Breklyn Fabrizio, MD;  Location: [facility] OR;  Service: Neurosurgery;  Laterality: N/A;  . LUMBAR PERCUTANEOUS PEDICLE SCREW 1 LEVEL N/A 03/13/2019   Procedure: LUMBAR PERCUTANEOUS PEDICLE SCREW LUMBAR  FIVE-SACRAL ONE LEVEL;  Surgeon: Shirlean Layah Skousen, MD;  Location: [facility] OR;  Service: Neurosurgery;  Laterality: N/A;  . TONSILLECTOMY     as a child    There were no vitals filed for this visit.  Subjective Assessment - 05/03/19 0933    Subjective  My Lt ankle is feeling better.  I have had to scale some things back when my leg was hurting and now I will begin to work it back in.    Currently in Pain?  Yes    Pain Score  1     Pain Location  Back    Pain Orientation  Left    Pain Descriptors / Indicators  Sharp    Pain Type  Acute pain                       [facility] Adult PT Treatment/Exercise - 05/03/19 0001      Lumbar Exercises: Stretches   Active Hamstring Stretch  Left;Right;3 reps;20 seconds    Piriformis Stretch  Left;Right;3 reps;20 seconds      Lumbar Exercises: Aerobic   UBE (Upper Arm Bike)  Level 2x 6 minutes (3/3)    Nustep  Level 5 x 8 minutes       Lumbar Exercises: Standing   Row  Both;20 reps    Row Limitations  20#  Shoulder Extension  Strengthening;Both;20 reps;Limitations    Shoulder Extension Limitations  20#    Other Standing Lumbar Exercises  standing on black pad: yellow ball press outs, diagonals 2x10 bil each      Shoulder Exercises: Seated   Flexion  Strengthening;Both;20 reps;Weights    Flexion Weight (lbs)  3#   on red ball   Abduction  Strengthening;Both;20 reps;Weights    ABduction Weight (lbs)  3#   on red ball   Diagonals  Strengthening;20 reps;Weights    Diagonals Weight (lbs)  3#   on red ball              PT Short Term Goals - 04/19/19 0939      PT SHORT TERM GOAL #1   Title  be independent in initial HEP    Status  Achieved      PT SHORT TERM GOAL #2   Title  demonstrate Lt hamstring/SLR to > or = to 75 degrees to improve functional mobility    Time  3    Period  Weeks    Status  On-going        PT Long Term Goals - 05/03/19 0941      PT LONG TERM GOAL #1   Title  be independent in advanced HEP     Time  6    Period  Weeks    Status  On-going      PT LONG TERM GOAL #4   Title  verbalize and demonstrate body mechanics modifications for lumbar protection with desk work and home tasks    Status  Achieved      PT LONG TERM GOAL #5   Title  report a 60% reduction in Lt LE pain with sitting to improve tolerance for sitting for work and driving.    Time  6    Period  Weeks    Status  On-going            Plan - 05/03/19 3646    Clinical Impression Statement  Pt has weaned from the brace >75% and now wears only with a lot of standing.  Pain in the Lt LE has resolved and pt will return to more consistent exercises.  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  Pt tolerated advancement of light weight training with core activation today. Pt will continue to benefit from skilled PT to advance strength, nerve mobility and endurance s/p lumbar fusion.    Stability/Clinical Decision Making  Stable/Uncomplicated    PT Frequency  2x / week    PT Duration  6 weeks    PT Treatment/Interventions  ADLs/Self Care Home Management;Cryotherapy;Electrical Stimulation;Moist Heat;Functional mobility training;Therapeutic activities;Therapeutic exercise;Neuromuscular re-education;Patient/family education;Manual techniques;Taping;Passive range of motion;Scar mobilization    PT Next Visit Plan  continue core and LE functional strength with emphasis on alignment.    PT Home Exercise Plan  Access Code: [email or web address]    Consulted and Agree with Plan of Care  Patient       Patient will benefit from skilled therapeutic intervention in order to improve the following deficits and impairments:  Abnormal gait, Decreased activity tolerance, Decreased strength, Impaired flexibility, Postural dysfunction, Improper body mechanics, Decreased scar mobility, Decreased range of motion, Increased muscle spasms, Decreased endurance  Visit Diagnosis: Chronic bilateral low back pain,  unspecified whether sciatica present  Muscle weakness (generalized)  Stiffness of left hip, not elsewhere classified  Stiffness of right hip, not elsewhere classified  Problem List Patient Active Problem List   Diagnosis Date Noted  . Spondylolisthesis, lumbar region 03/13/2019  . DDD (degenerative disc disease), lumbosacral   . Prediabetes   . Dyslipidemia      Sigurd Sos, PT 05/03/19 10:08 AM  [facility] [address] [place], [place], [address] Phone: [phone]   Fax:  [phone]  Name: Rick Maldonado MRN: [id number] Date of Birth: 1985-07-24

## 2019-05-08 ENCOUNTER — Ambulatory Visit: Payer: 59

## 2019-05-08 ENCOUNTER — Other Ambulatory Visit: Payer: Self-pay

## 2019-05-08 DIAGNOSIS — M25652 Stiffness of left hip, not elsewhere classified: Secondary | ICD-10-CM

## 2019-05-08 DIAGNOSIS — M25651 Stiffness of right hip, not elsewhere classified: Secondary | ICD-10-CM

## 2019-05-08 DIAGNOSIS — M6281 Muscle weakness (generalized): Secondary | ICD-10-CM

## 2019-05-08 DIAGNOSIS — G8929 Other chronic pain: Secondary | ICD-10-CM

## 2019-05-08 NOTE — Therapy (Signed)
[facility] [address] [place], [place], [address] Phone: [phone]   Fax:  [phone]  Physical Therapy Treatment  Patient Details  Name: Rick Maldonado MRN: [id number] Date of Birth: [birth year] Referring Provider (PT): Shirlean Abrish Erny, MD   Encounter Date: 05/08/2019  PT End of Session - 05/08/19 1056    Visit Number  9    Date for PT Re-Evaluation  05/18/19    Authorization Type  [employer]    Authorization - Visit Number  9    Authorization - Number of Visits  60    PT Start Time  1014    PT Stop Time  1056    PT Time Calculation (min)  42 min    Activity Tolerance  Patient tolerated treatment well    Behavior During Therapy  [facility] for tasks assessed/performed       Past Medical History:  Diagnosis Date  . Acid reflux   . Ankle pain   . Buttock pain   . DDD (degenerative disc disease), lumbosacral    bilateral neural foraminal stenosis at L5-S1, mass effect on B exiting L5 nerve roots (2/20)  . Dyslipidemia   . Hip pain   . Knee pain   . Leg pain   . Prediabetes   . Sleep apnea    cpap  . Spondylolysis     Past Surgical History:  Procedure Laterality Date  . ABDOMINAL EXPOSURE N/A 03/13/2019   Procedure: ABDOMINAL EXPOSURE;  Surgeon: Cephus Shelling, MD;  Location: [facility] OR;  Service: Vascular;  Laterality: N/A;  . ANTERIOR LUMBAR FUSION N/A 03/13/2019   Procedure: Lumbar five Sacral one Anterior lumbar interbody fusion with percutaneous posterior instrumentation;  Surgeon: Shirlean Shon Mansouri, MD;  Location: [facility] OR;  Service: Neurosurgery;  Laterality: N/A;  . APPLICATION OF ROBOTIC ASSISTANCE FOR SPINAL PROCEDURE N/A 03/13/2019   Procedure: APPLICATION OF ROBOTIC ASSISTANCE FOR SPINAL PROCEDURE;  Surgeon: Shirlean Roanna Reaves, MD;  Location: [facility] OR;  Service: Neurosurgery;  Laterality: N/A;  . LUMBAR PERCUTANEOUS PEDICLE SCREW 1 LEVEL N/A 03/13/2019   Procedure: LUMBAR PERCUTANEOUS PEDICLE SCREW LUMBAR  FIVE-SACRAL ONE LEVEL;  Surgeon: Shirlean Roberta Angell, MD;  Location: [facility] OR;  Service: Neurosurgery;  Laterality: N/A;  . TONSILLECTOMY     as a child    There were no vitals filed for this visit.  Subjective Assessment - 05/08/19 1020    Subjective  I am not having pain down my Lt leg from my hip to the top of my foot.  Started last week- I was at the computer sitting for more time.    Pertinent History  lumbar fusion L5-S1    Patient Stated Goals  improve strength, flexibility, wean from lumbar brace per MD orders    Currently in Pain?  Yes    Pain Location  Back    Pain Orientation  Left    Pain Descriptors / Indicators  Burning;Shooting    Pain Type  Surgical pain    Pain Radiating Towards  to Lt LE    Pain Onset  More than a month ago    Pain Frequency  Constant    Aggravating Factors   sitting for too long, cooking, reaching    Pain Relieving Factors  walking, using reacher, heat/ice         [facility] PT Assessment - 05/08/19 0001      PROM   Overall PROM Comments  Lt hamstring SLR: 55 degrees  [facility] Adult PT Treatment/Exercise - 05/08/19 0001      Lumbar Exercises: Stretches   Active Hamstring Stretch  Left;Right;3 reps;20 seconds    Active Hamstring Stretch Limitations  strap      Lumbar Exercises: Aerobic   UBE (Upper Arm Bike)  Level 2x 6 minutes (3/3)      Lumbar Exercises: Supine   Ab Set  20 reps    AB Set Limitations  ball squeezes      Shoulder Exercises: Supine   Horizontal ABduction  Strengthening;Both;20 reps    Theraband Level (Shoulder Horizontal ABduction)  Level 2 (Red)    Horizontal ABduction Limitations  with ab bracing    Diagonals  Strengthening;Both;20 reps    Theraband Level (Shoulder Diagonals)  Level 2 (Red)    Diagonals Limitations  with abdominal bracing               PT Short Term Goals - 05/08/19 1046      PT SHORT TERM GOAL #2   Title  demonstrate Lt hamstring/SLR to > or = to 75 degrees to  improve functional mobility    Baseline  55 degrees with strap    Status  On-going        PT Long Term Goals - 05/03/19 0941      PT LONG TERM GOAL #1   Title  be independent in advanced HEP    Time  6    Period  Weeks    Status  On-going      PT LONG TERM GOAL #4   Title  verbalize and demonstrate body mechanics modifications for lumbar protection with desk work and home tasks    Status  Achieved      PT LONG TERM GOAL #5   Title  report a 60% reduction in Lt LE pain with sitting to improve tolerance for sitting for work and driving.    Time  6    Period  Weeks    Status  On-going            Plan - 05/08/19 1044    Clinical Impression Statement  Pt has increased Lt LE pain/radiculopathy over the weekend.  Pt has had increased activity with sitting at work and around the house.  Pt as now started wearing the brace again when walking for exercise and is limiting activity at home until pain resolves.  Exercise today was performed with good lumbar support and pt experienced some Lt oblique pain with abdominal bracing.  Pt continues to perform nerve flossing and make body mechanics modifications to reduce lumbar strain.  Pt will continue to benefit from skilled PT to address strength and endurance s/p lumbar fusion.    Rehab Potential  Good    PT Frequency  2x / week    PT Duration  6 weeks    PT Treatment/Interventions  ADLs/Self Care Home Management;Cryotherapy;Electrical Stimulation;Moist Heat;Functional mobility training;Therapeutic activities;Therapeutic exercise;Neuromuscular re-education;Patient/family education;Manual techniques;Taping;Passive range of motion;Scar mobilization    PT Next Visit Plan  continue core and LE functional strength with emphasis on alignment.    PT Home Exercise Plan  Access Code: [email or web address]    Consulted and Agree with Plan of Care  Patient       Patient will benefit from skilled therapeutic intervention in order to improve the following deficits  and impairments:  Abnormal gait, Decreased activity tolerance, Decreased strength, Impaired flexibility, Postural dysfunction, Improper body mechanics, Decreased scar mobility, Decreased range of motion, Increased muscle spasms, Decreased endurance  Visit Diagnosis: Chronic bilateral low back pain, unspecified whether sciatica present  Muscle weakness (generalized)  Stiffness of left hip, not elsewhere classified  Stiffness of right hip, not elsewhere classified     Problem List Patient Active Problem List   Diagnosis Date Noted  . Spondylolisthesis, lumbar region 03/13/2019  . DDD (degenerative disc disease), lumbosacral   . Prediabetes   . Dyslipidemia     Sigurd Sos, PT 05/08/19 10:58 AM  [facility] [address] [place], [place], [address] Phone: [phone]   Fax:  [phone]  Name: Rick Maldonado MRN: [id number] Date of Birth: Jul 07, 1985

## 2019-05-10 ENCOUNTER — Other Ambulatory Visit: Payer: Self-pay

## 2019-05-10 ENCOUNTER — Ambulatory Visit: Payer: 59

## 2019-05-10 DIAGNOSIS — M545 Low back pain, unspecified: Secondary | ICD-10-CM

## 2019-05-10 DIAGNOSIS — M6281 Muscle weakness (generalized): Secondary | ICD-10-CM | POA: Diagnosis not present

## 2019-05-10 DIAGNOSIS — M25652 Stiffness of left hip, not elsewhere classified: Secondary | ICD-10-CM

## 2019-05-10 DIAGNOSIS — G8929 Other chronic pain: Secondary | ICD-10-CM

## 2019-05-10 DIAGNOSIS — M25651 Stiffness of right hip, not elsewhere classified: Secondary | ICD-10-CM

## 2019-05-10 NOTE — Therapy (Signed)
[facility] [address] [place], [place], [address] Phone: [phone]   Fax:  [phone]  Physical Therapy Treatment  Patient Details  Name: Rick Maldonado MRN: [id number] Date of Birth: Jun 01, 1985 Referring Provider (PT): Shirlean Carl Bleecker, MD   Encounter Date: 05/10/2019  PT End of Session - 05/10/19 1010    Visit Number  10    Date for PT Re-Evaluation  05/18/19    Authorization Type  [employer]    Authorization - Visit Number  10    Authorization - Number of Visits  60    PT Start Time  0930    PT Stop Time  1012    PT Time Calculation (min)  42 min    Activity Tolerance  Patient tolerated treatment well    Behavior During Therapy  [facility] for tasks assessed/performed       Past Medical History:  Diagnosis Date  . Acid reflux   . Ankle pain   . Buttock pain   . DDD (degenerative disc disease), lumbosacral    bilateral neural foraminal stenosis at L5-S1, mass effect on B exiting L5 nerve roots (2/20)  . Dyslipidemia   . Hip pain   . Knee pain   . Leg pain   . Prediabetes   . Sleep apnea    cpap  . Spondylolysis     Past Surgical History:  Procedure Laterality Date  . ABDOMINAL EXPOSURE N/A 03/13/2019   Procedure: ABDOMINAL EXPOSURE;  Surgeon: Cephus Shelling, MD;  Location: [facility] OR;  Service: Vascular;  Laterality: N/A;  . ANTERIOR LUMBAR FUSION N/A 03/13/2019   Procedure: Lumbar five Sacral one Anterior lumbar interbody fusion with percutaneous posterior instrumentation;  Surgeon: Shirlean Shadana Pry, MD;  Location: [facility] OR;  Service: Neurosurgery;  Laterality: N/A;  . APPLICATION OF ROBOTIC ASSISTANCE FOR SPINAL PROCEDURE N/A 03/13/2019   Procedure: APPLICATION OF ROBOTIC ASSISTANCE FOR SPINAL PROCEDURE;  Surgeon: Shirlean Dasie Chancellor, MD;  Location: [facility] OR;  Service: Neurosurgery;  Laterality: N/A;  . LUMBAR PERCUTANEOUS PEDICLE SCREW 1 LEVEL N/A 03/13/2019   Procedure: LUMBAR PERCUTANEOUS PEDICLE SCREW LUMBAR  FIVE-SACRAL ONE LEVEL;  Surgeon: Shirlean Airis Barbee, MD;  Location: [facility] OR;  Service: Neurosurgery;  Laterality: N/A;  . TONSILLECTOMY     as a child    There were no vitals filed for this visit.  Subjective Assessment - 05/10/19 0937    Subjective  I am still having Lt leg pain that worsens as the day goes.    Pertinent History  lumbar fusion L5-S1    How long can you sit comfortably?  15 minutes, begin to have pain in Lt LE    Patient Stated Goals  improve strength, flexibility, wean from lumbar brace per MD orders    Currently in Pain?  Yes    Pain Score  5     Pain Location  Back    Pain Orientation  Left    Pain Descriptors / Indicators  Aching;Shooting;Burning    Pain Type  Surgical pain    Pain Onset  More than a month ago    Pain Frequency  Constant    Aggravating Factors   as the day progresses, squatting, sitting on the edge of the bed, cooking    Pain Relieving Factors  using reacher, walking, heat/ice                       [facility] Adult PT Treatment/Exercise - 05/10/19 0001  Lumbar Exercises: Aerobic   UBE (Upper Arm Bike)  Level 2x 6 minutes (3/3)      Lumbar Exercises: Supine   Ab Set  20 reps    AB Set Limitations  ball squeezes      Knee/Hip Exercises: Standing   Heel Raises  2 sets;10 reps    Forward Step Up  2 sets;10 reps;Both    Rebounder  weight shifting 3 ways x1 minute each      Shoulder Exercises: Supine   Horizontal ABduction  Strengthening;Both;20 reps    Theraband Level (Shoulder Horizontal ABduction)  Level 2 (Red)    Horizontal ABduction Limitations  with ab bracing    Diagonals  Strengthening;Both;20 reps    Theraband Level (Shoulder Diagonals)  Level 2 (Red)    Diagonals Limitations  with abdominal bracing               PT Short Term Goals - 05/08/19 1046      PT SHORT TERM GOAL #2   Title  demonstrate Lt hamstring/SLR to > or = to 75 degrees to improve functional mobility    Baseline  55 degrees with strap     Status  On-going        PT Long Term Goals - 05/03/19 0941      PT LONG TERM GOAL #1   Title  be independent in advanced HEP    Time  6    Period  Weeks    Status  On-going      PT LONG TERM GOAL #4   Title  verbalize and demonstrate body mechanics modifications for lumbar protection with desk work and home tasks    Status  Achieved      PT LONG TERM GOAL #5   Title  report a 60% reduction in Lt LE pain with sitting to improve tolerance for sitting for work and driving.    Time  6    Period  Weeks    Status  On-going            Plan - 05/10/19 0949    Clinical Impression Statement  Pt continues to report Lt LE radiculopathy that worsens as the day goes by.  Pt reports increased pain with squatting and cooking.  Pt has reached out to the MD without response.  Pt is able to perform neutral spine exercise in the clinic without increased pain or limitation.  Pt with reduced Lt abdominal pain today with contraction of the core.  Pt will continue to walk regularly and perform neutral strength exercises as long as this does not aggravate Lt LE symptoms.    PT Frequency  2x / week    PT Duration  6 weeks    PT Treatment/Interventions  ADLs/Self Care Home Management;Cryotherapy;Electrical Stimulation;Moist Heat;Functional mobility training;Therapeutic activities;Therapeutic exercise;Neuromuscular re-education;Patient/family education;Manual techniques;Taping;Passive range of motion;Scar mobilization    PT Next Visit Plan  continue core and LE functional strength with emphasis on alignment.  See what MD says if pt is able to get in contact.    PT Home Exercise Plan  Access Code: [email or web address]    Consulted and Agree with Plan of Care  Patient       Patient will benefit from skilled therapeutic intervention in order to improve the following deficits and impairments:  Abnormal gait, Decreased activity tolerance, Decreased strength, Impaired flexibility, Postural dysfunction, Improper body  mechanics, Decreased scar mobility, Decreased range of motion, Increased muscle spasms, Decreased endurance  Visit Diagnosis: Chronic bilateral low  back pain, unspecified whether sciatica present  Muscle weakness (generalized)  Stiffness of left hip, not elsewhere classified  Stiffness of right hip, not elsewhere classified     Problem List Patient Active Problem List   Diagnosis Date Noted  . Spondylolisthesis, lumbar region 03/13/2019  . DDD (degenerative disc disease), lumbosacral   . Prediabetes   . Dyslipidemia     Sigurd Sos, PT 05/10/19 10:11 AM  [facility] [address] [place], [place], [address] Phone: [phone]   Fax:  [phone]  Name: Rick Maldonado MRN: [id number] Date of Birth: 08/26/1985

## 2019-05-15 ENCOUNTER — Other Ambulatory Visit: Payer: Self-pay

## 2019-05-15 ENCOUNTER — Ambulatory Visit: Payer: 59

## 2019-05-15 DIAGNOSIS — M6281 Muscle weakness (generalized): Secondary | ICD-10-CM

## 2019-05-15 DIAGNOSIS — G8929 Other chronic pain: Secondary | ICD-10-CM

## 2019-05-15 DIAGNOSIS — M25651 Stiffness of right hip, not elsewhere classified: Secondary | ICD-10-CM

## 2019-05-15 DIAGNOSIS — M25652 Stiffness of left hip, not elsewhere classified: Secondary | ICD-10-CM

## 2019-05-15 NOTE — Therapy (Signed)
[facility] [address], [place], [place], [address] Phone: [phone]   Fax:  [phone]  Physical Therapy Treatment  Patient Details  Name: Rick Maldonado MRN: [id number] Date of Birth: 11/01/1985 Referring Provider (PT): Jovita Gamma, MD   Encounter Date: 05/15/2019  PT End of Session - 05/15/19 1310    Visit Number  11    Date for PT Re-Evaluation  06/26/19    Authorization Type  [employer]    Authorization - Visit Number  11    Authorization - Number of Visits  60    PT Start Time  1230    PT Stop Time  1315    PT Time Calculation (min)  45 min    Activity Tolerance  Patient tolerated treatment well    Behavior During Therapy  [facility] for tasks assessed/performed       Past Medical History:  Diagnosis Date  . Acid reflux   . Ankle pain   . Buttock pain   . DDD (degenerative disc disease), lumbosacral    bilateral neural foraminal stenosis at L5-S1, mass effect on B exiting L5 nerve roots (2/20)  . Dyslipidemia   . Hip pain   . Knee pain   . Leg pain   . Prediabetes   . Sleep apnea    cpap  . Spondylolysis     Past Surgical History:  Procedure Laterality Date  . ABDOMINAL EXPOSURE N/A 03/13/2019   Procedure: ABDOMINAL EXPOSURE;  Surgeon: Marty Heck, MD;  Location: [place];  Service: Vascular;  Laterality: N/A;  . ANTERIOR LUMBAR FUSION N/A 03/13/2019   Procedure: Lumbar five Sacral one Anterior lumbar interbody fusion with percutaneous posterior instrumentation;  Surgeon: Jovita Gamma, MD;  Location: [place];  Service: Neurosurgery;  Laterality: N/A;  . APPLICATION OF ROBOTIC ASSISTANCE FOR SPINAL PROCEDURE N/A 03/13/2019   Procedure: APPLICATION OF ROBOTIC ASSISTANCE FOR SPINAL PROCEDURE;  Surgeon: Jovita Gamma, MD;  Location: [place];  Service: Neurosurgery;  Laterality: N/A;  . LUMBAR PERCUTANEOUS PEDICLE SCREW 1 LEVEL N/A 03/13/2019   Procedure: LUMBAR PERCUTANEOUS PEDICLE SCREW LUMBAR  FIVE-SACRAL ONE LEVEL;  Surgeon: Jovita Gamma, MD;  Location: [place];  Service: Neurosurgery;  Laterality: N/A;  . TONSILLECTOMY     as a child    There were no vitals filed for this visit.  Subjective Assessment - 05/15/19 1232    Subjective  I am feeling a little bit better.  I took 1/2 of a muscle relaxer and that seemed to help the pain.  I am also using the TENs unit.    Currently in Pain?  Yes    Pain Score  2     Pain Location  Back    Pain Orientation  Left    Pain Descriptors / Indicators  Aching;Burning;Shooting    Pain Type  Surgical pain    Pain Radiating Towards  top of Lt ankle    Pain Onset  More than a month ago    Pain Frequency  Constant    Aggravating Factors   as the day progresses, squatting, sitting on the edge of the bed, cooking    Pain Relieving Factors  muscle relaxer/TENs unit, walking, heat/ice         [facility] PT Assessment - 05/15/19 0001      Assessment   Medical Diagnosis  s/p lumbar fusion (L5-S1)    Referring Provider (PT)  Jovita Gamma, MD    Onset Date/Surgical Date  03/13/19  Observation/Other Assessments   Focus on Therapeutic Outcomes (FOTO)   50% limitation      PROM   Overall PROM Comments  Lt hamstring SLR: 70 degrees      Strength   Right Hip Flexion  4+/5    Right Hip Extension  4+/5    Right Hip ABduction  4+/5    Left Hip Flexion  4+/5    Left Hip Extension  4/5    Left Hip ABduction  4/5                   [facility] Adult PT Treatment/Exercise - 05/15/19 0001      Lumbar Exercises: Aerobic   UBE (Upper Arm Bike)  Level 2x 8 minutes (3/3)    Nustep  Level 5 x 8 minutes       Lumbar Exercises: Supine   Ab Set  20 reps    AB Set Limitations  ball squeezes      Knee/Hip Exercises: Standing   Hip Extension  Stengthening;Both;2 sets;10 reps    Rebounder  weight shifting 3 ways x1 minute each      Knee/Hip Exercises: Prone   Straight Leg Raises  Strengthening;2 sets;20 reps      Shoulder Exercises:  Seated   Horizontal ABduction  Strengthening;Both;20 reps    Theraband Level (Shoulder Horizontal ABduction)  Level 2 (Red)    Diagonals  Strengthening;20 reps;Theraband    Theraband Level (Shoulder Diagonals)  Level 2 (Red)               PT Short Term Goals - 05/08/19 1046      PT SHORT TERM GOAL #2   Title  demonstrate Lt hamstring/SLR to > or = to 75 degrees to improve functional mobility    Baseline  55 degrees with strap    Status  On-going        PT Long Term Goals - 05/15/19 1234      PT LONG TERM GOAL #1   Title  be independent in advanced HEP    Time  6    Period  Weeks    Target Date  06/26/19      PT LONG TERM GOAL #2   Title  reduce FOTO to < or = to 31% limitation    Baseline  50% limitation    Time  6    Period  Weeks    Status  On-going    Target Date  06/26/19      PT LONG TERM GOAL #3   Title  demonstrate 4+/5 bil hip strength to improve functional endurance and improve safety with return to spin bike    Baseline  4/5 Lt hip abduction and extension    Time  6    Period  Weeks    Status  On-going      PT LONG TERM GOAL #4   Title  verbalize and demonstrate body mechanics modifications for lumbar protection with desk work and home tasks    Status  Achieved      PT LONG TERM GOAL #5   Title  report a 60% reduction in Lt LE pain with sitting to improve tolerance for sitting for work and driving.    Baseline  not met due to flare-up    Time  6    Period  Weeks    Status  On-going    Target Date  06/26/19            Plan -  05/15/19 1258    Clinical Impression Statement  Pt with reduced Lt LE symptoms since last session as he has been using TENS unit for pain management.  Pt with up to 4/10 Lt LE radiculopathy that is aggravated by the end of the day, squatting and cooking.  Pt with improved LE strength and Rt=Lt except Lt hip abduction and extension are 4/5.  Pt has weaned from the brace 75-100% depending on activity.  Pt had to scale  back activity last week due to flare-up of Lt LE pain.  Pt will continue to benefit from skilled PT s/p lumbar fusion to address strength, flexibility, mobility and pain.    Rehab Potential  Good    PT Frequency  2x / week    PT Duration  6 weeks    PT Treatment/Interventions  ADLs/Self Care Home Management;Cryotherapy;Electrical Stimulation;Moist Heat;Functional mobility training;Therapeutic activities;Therapeutic exercise;Neuromuscular re-education;Patient/family education;Manual techniques;Taping;Passive range of motion;Scar mobilization    PT Next Visit Plan  continue core and LE functional strength with emphasis on alignment.  See what MD says if pt is able to get in contact.    PT Home Exercise Plan  Access Code: [email or web address]    Recommended Other Services  initial cert is signed.  Recert sent 7/51/02    Consulted and Agree with Plan of Care  Patient       Patient will benefit from skilled therapeutic intervention in order to improve the following deficits and impairments:  Abnormal gait, Decreased activity tolerance, Decreased strength, Impaired flexibility, Postural dysfunction, Improper body mechanics, Decreased scar mobility, Decreased range of motion, Increased muscle spasms, Decreased endurance  Visit Diagnosis: Muscle weakness (generalized) - Plan: PT plan of care cert/re-cert  Chronic bilateral low back pain, unspecified whether sciatica present - Plan: PT plan of care cert/re-cert  Stiffness of left hip, not elsewhere classified - Plan: PT plan of care cert/re-cert  Stiffness of right hip, not elsewhere classified - Plan: PT plan of care cert/re-cert     Problem List Patient Active Problem List   Diagnosis Date Noted  . Spondylolisthesis, lumbar region 03/13/2019  . DDD (degenerative disc disease), lumbosacral   . Prediabetes   . Dyslipidemia      Rick Maldonado, PT 05/15/19 1:12 PM  [facility] [address]  [place], [place], [address] Phone: [phone]   Fax:  [phone]  Name: Rick Maldonado MRN: [id number] Date of Birth: [birth year]

## 2019-05-17 ENCOUNTER — Ambulatory Visit: Payer: 59

## 2019-05-17 ENCOUNTER — Other Ambulatory Visit: Payer: Self-pay

## 2019-05-17 DIAGNOSIS — M25651 Stiffness of right hip, not elsewhere classified: Secondary | ICD-10-CM

## 2019-05-17 DIAGNOSIS — M6281 Muscle weakness (generalized): Secondary | ICD-10-CM

## 2019-05-17 DIAGNOSIS — M25652 Stiffness of left hip, not elsewhere classified: Secondary | ICD-10-CM

## 2019-05-17 DIAGNOSIS — M545 Low back pain, unspecified: Secondary | ICD-10-CM

## 2019-05-17 DIAGNOSIS — G8929 Other chronic pain: Secondary | ICD-10-CM

## 2019-05-17 NOTE — Therapy (Signed)
[facility] [address], [place], [place], [address] Phone: [phone]   Fax:  [phone]  Physical Therapy Treatment  Patient Details  Name: Rick Maldonado MRN: [id number] Date of Birth: 1985/06/04 Referring Provider (Rick Maldonado): Jovita Gamma, MD   Encounter Date: 05/17/2019  Rick Maldonado End of Session - 05/17/19 1311    Visit Number  12    Date for Rick Maldonado Re-Evaluation  06/26/19    Authorization Type  [employer]    Authorization - Visit Number  12    Authorization - Number of Visits  29    Rick Maldonado Start Time  7564    Rick Maldonado Stop Time  1319    Rick Maldonado Time Calculation (min)  43 min    Activity Tolerance  Patient tolerated treatment well    Behavior During Therapy  [facility] for tasks assessed/performed       Past Medical History:  Diagnosis Date  . Acid reflux   . Ankle pain   . Buttock pain   . DDD (degenerative disc disease), lumbosacral    bilateral neural foraminal stenosis at L5-S1, mass effect on B exiting L5 nerve roots (2/20)  . Dyslipidemia   . Hip pain   . Knee pain   . Leg pain   . Prediabetes   . Sleep apnea    cpap  . Spondylolysis     Past Surgical History:  Procedure Laterality Date  . ABDOMINAL EXPOSURE N/A 03/13/2019   Procedure: ABDOMINAL EXPOSURE;  Surgeon: Marty Heck, MD;  Location: [place];  Service: Vascular;  Laterality: N/A;  . ANTERIOR LUMBAR FUSION N/A 03/13/2019   Procedure: Lumbar five Sacral one Anterior lumbar interbody fusion with percutaneous posterior instrumentation;  Surgeon: Jovita Gamma, MD;  Location: [place];  Service: Neurosurgery;  Laterality: N/A;  . APPLICATION OF ROBOTIC ASSISTANCE FOR SPINAL PROCEDURE N/A 03/13/2019   Procedure: APPLICATION OF ROBOTIC ASSISTANCE FOR SPINAL PROCEDURE;  Surgeon: Jovita Gamma, MD;  Location: [place];  Service: Neurosurgery;  Laterality: N/A;  . LUMBAR PERCUTANEOUS PEDICLE SCREW 1 LEVEL N/A 03/13/2019   Procedure: LUMBAR PERCUTANEOUS PEDICLE SCREW LUMBAR  FIVE-SACRAL ONE LEVEL;  Surgeon: Jovita Gamma, MD;  Location: [place];  Service: Neurosurgery;  Laterality: N/A;  . TONSILLECTOMY     as a child    There were no vitals filed for this visit.  Subjective Assessment - 05/17/19 1242    Subjective  I saw the MD.  He did x-rays and said everything looked good.  The leg symptoms that I am experiencing are normal.    Currently in Pain?  Yes    Pain Score  2     Pain Location  Back    Pain Orientation  Left    Pain Descriptors / Indicators  Aching;Burning;Shooting                       [facility] Adult Rick Maldonado Treatment/Exercise - 05/17/19 0001      Lumbar Exercises: Aerobic   UBE (Upper Arm Bike)  Level 2x 8 minutes (3/3)    Nustep  Level 5 x 8 minutes       Knee/Hip Exercises: Standing   Rebounder  weight shifting 3 ways x1 minute each    Other Standing Knee Exercises  walking in reverse 25# 2x10    Other Standing Knee Exercises  standing on black pad: yellow ball diagonals 2x10      Knee/Hip Exercises: Seated   Sit to Sand  20 reps;without  UE support   holding yellow ball, standing on black pad     Shoulder Exercises: Seated   Horizontal ABduction  Strengthening;Both;20 reps    Theraband Level (Shoulder Horizontal ABduction)  Level 2 (Red)    Horizontal ABduction Limitations  sitting on red ball    External Rotation  Strengthening;Both;20 reps;Theraband    Theraband Level (Shoulder External Rotation)  Level 2 (Red)    External Rotation Limitations  seated on red ball    Diagonals  Strengthening;20 reps;Theraband    Theraband Level (Shoulder Diagonals)  Level 2 (Red)               Rick Maldonado Short Term Goals - 05/08/19 1046      Rick Maldonado SHORT TERM GOAL #2   Title  demonstrate Lt hamstring/SLR to > or = to 75 degrees to improve functional mobility    Baseline  55 degrees with strap    Status  On-going        Rick Maldonado Long Term Goals - 05/15/19 1234      Rick Maldonado LONG TERM GOAL #1   Title  be independent in advanced HEP     Time  6    Period  Weeks    Target Date  06/26/19      Rick Maldonado LONG TERM GOAL #2   Title  reduce FOTO to < or = to 31% limitation    Baseline  50% limitation    Time  6    Period  Weeks    Status  On-going    Target Date  06/26/19      Rick Maldonado LONG TERM GOAL #3   Title  demonstrate 4+/5 bil hip strength to improve functional endurance and improve safety with return to spin bike    Baseline  4/5 Lt hip abduction and extension    Time  6    Period  Weeks    Status  On-going      Rick Maldonado LONG TERM GOAL #4   Title  verbalize and demonstrate body mechanics modifications for lumbar protection with desk work and home tasks    Status  Achieved      Rick Maldonado LONG TERM GOAL #5   Title  report a 60% reduction in Lt LE pain with sitting to improve tolerance for sitting for work and driving.    Baseline  not met due to flare-up    Time  6    Period  Weeks    Status  On-going    Target Date  06/26/19            Plan - 05/17/19 1252    Clinical Impression Statement  Rick Maldonado saw MD and he was pleased with progress.  The LE symptoms are appropriate s/p surgery.   Rick Maldonado with up to 4/10 Lt LE radiculopathy that is aggravated by the end of the day, squatting and cooking.  Rick Maldonado with improved LE strength and Rt=Lt except Lt hip abduction and extension are 4/5.  Rick Maldonado has returned to full time work at his [employer], early years/pre.   Rick Maldonado will continue to benefit from skilled Rick Maldonado s/p lumbar fusion to address strength, flexibility, mobility and pain.    Rick Maldonado Frequency  2x / week    Rick Maldonado Duration  6 weeks    Rick Maldonado Treatment/Interventions  ADLs/Self Care Home Management;Cryotherapy;Electrical Stimulation;Moist Heat;Functional mobility training;Therapeutic activities;Therapeutic exercise;Neuromuscular re-education;Patient/family education;Manual techniques;Taping;Passive range of motion;Scar mobilization    Rick Maldonado Next Visit Plan  continue core and LE functional strength with emphasis on alignment.  Rick Maldonado Home Exercise Plan  Access Code: [email or web address]     Consulted and Agree with Plan of Care  Patient       Patient will benefit from skilled therapeutic intervention in order to improve the following deficits and impairments:  Abnormal gait, Decreased activity tolerance, Decreased strength, Impaired flexibility, Postural dysfunction, Improper body mechanics, Decreased scar mobility, Decreased range of motion, Increased muscle spasms, Decreased endurance  Visit Diagnosis: Muscle weakness (generalized)  Chronic bilateral low back pain, unspecified whether sciatica present  Stiffness of left hip, not elsewhere classified  Stiffness of right hip, not elsewhere classified     Problem List Patient Active Problem List   Diagnosis Date Noted  . Spondylolisthesis, lumbar region 03/13/2019  . DDD (degenerative disc disease), lumbosacral   . Prediabetes   . Dyslipidemia    Rick Maldonado, Rick Maldonado 05/17/19 1:12 PM  [facility] [address] [place], [place], [address] Phone: [phone]   Fax:  [phone]  Name: Rick Maldonado MRN: [id number] Date of Birth: February 25, 1986

## 2019-05-22 ENCOUNTER — Other Ambulatory Visit: Payer: Self-pay

## 2019-05-22 ENCOUNTER — Ambulatory Visit: Payer: 59

## 2019-05-22 DIAGNOSIS — M25651 Stiffness of right hip, not elsewhere classified: Secondary | ICD-10-CM

## 2019-05-22 DIAGNOSIS — M6281 Muscle weakness (generalized): Secondary | ICD-10-CM

## 2019-05-22 DIAGNOSIS — M545 Low back pain, unspecified: Secondary | ICD-10-CM

## 2019-05-22 DIAGNOSIS — G8929 Other chronic pain: Secondary | ICD-10-CM

## 2019-05-22 DIAGNOSIS — M25652 Stiffness of left hip, not elsewhere classified: Secondary | ICD-10-CM

## 2019-05-22 NOTE — Therapy (Signed)
[facility] [address] [place], [place], [address] Phone: [phone]   Fax:  [phone]  Physical Therapy Treatment  Patient Details  Name: Rick Maldonado MRN: [id number] Date of Birth: 1985-04-05 Referring Provider (PT): Shirlean Anique Beckley, MD   Encounter Date: 05/22/2019  PT End of Session - 05/22/19 1305    Visit Number  13    Date for PT Re-Evaluation  06/26/19    Authorization Type  [employer]    Authorization - Visit Number  13    Authorization - Number of Visits  60    PT Start Time  1231    PT Stop Time  1311    PT Time Calculation (min)  40 min    Activity Tolerance  Patient tolerated treatment well    Behavior During Therapy  [facility] for tasks assessed/performed       Past Medical History:  Diagnosis Date  . Acid reflux   . Ankle pain   . Buttock pain   . DDD (degenerative disc disease), lumbosacral    bilateral neural foraminal stenosis at L5-S1, mass effect on B exiting L5 nerve roots (2/20)  . Dyslipidemia   . Hip pain   . Knee pain   . Leg pain   . Prediabetes   . Sleep apnea    cpap  . Spondylolysis     Past Surgical History:  Procedure Laterality Date  . ABDOMINAL EXPOSURE N/A 03/13/2019   Procedure: ABDOMINAL EXPOSURE;  Surgeon: Cephus Shelling, MD;  Location: [facility];  Service: Vascular;  Laterality: N/A;  . ANTERIOR LUMBAR FUSION N/A 03/13/2019   Procedure: Lumbar five Sacral one Anterior lumbar interbody fusion with percutaneous posterior instrumentation;  Surgeon: Shirlean Gwendlyon Zumbro, MD;  Location: [facility] OR;  Service: Neurosurgery;  Laterality: N/A;  . APPLICATION OF ROBOTIC ASSISTANCE FOR SPINAL PROCEDURE N/A 03/13/2019   Procedure: APPLICATION OF ROBOTIC ASSISTANCE FOR SPINAL PROCEDURE;  Surgeon: Shirlean Maleeha Halls, MD;  Location: [facility] OR;  Service: Neurosurgery;  Laterality: N/A;  . LUMBAR PERCUTANEOUS PEDICLE SCREW 1 LEVEL N/A 03/13/2019   Procedure: LUMBAR PERCUTANEOUS PEDICLE SCREW LUMBAR  FIVE-SACRAL ONE LEVEL;  Surgeon: Shirlean Kyl Givler, MD;  Location: [facility] OR;  Service: Neurosurgery;  Laterality: N/A;  . TONSILLECTOMY     as a child    There were no vitals filed for this visit.  Subjective Assessment - 05/22/19 1236    Subjective  I am fatigued at the end of the day but otherwise doing well.    Currently in Pain?  Yes    Pain Location  Back    Pain Orientation  Left    Pain Descriptors / Indicators  Aching;Burning;Shooting    Pain Type  Surgical pain    Pain Radiating Towards  Rt lateral leg    Pain Onset  More than a month ago    Pain Frequency  Constant    Aggravating Factors   as the day progresses, squatting, cooking    Pain Relieving Factors  TENs, muscle relaxer                       [facility] Adult PT Treatment/Exercise - 05/22/19 0001      Lumbar Exercises: Aerobic   UBE (Upper Arm Bike)  Level 2x 8 minutes (3/3)    Nustep  Level 5 x 8 minutes       Lumbar Exercises: Standing   Other Standing Lumbar Exercises  single leg stance with D1 pulleys 15# 2x10  each       Knee/Hip Exercises: Standing   Rebounder  weight shifting 3 ways x1 minute each    Other Standing Knee Exercises  walking in reverse 30# 2x10    Other Standing Knee Exercises  standing on black pad: yellow ball diagonals 2x10      Knee/Hip Exercises: Seated   Sit to Sand  20 reps;without UE support   holding yellow ball, standing on black pad     Shoulder Exercises: Seated   Horizontal ABduction  Strengthening;Both;20 reps    Theraband Level (Shoulder Horizontal ABduction)  Level 2 (Red)    Horizontal ABduction Limitations  sitting on red ball    External Rotation  Strengthening;Both;20 reps;Theraband    Theraband Level (Shoulder External Rotation)  Level 2 (Red)    External Rotation Limitations  seated on red ball    Diagonals  Strengthening;20 reps;Theraband    Theraband Level (Shoulder Diagonals)  Level 2 (Red)               PT Short Term Goals - 05/08/19 1046       PT SHORT TERM GOAL #2   Title  demonstrate Lt hamstring/SLR to > or = to 75 degrees to improve functional mobility    Baseline  55 degrees with strap    Status  On-going        PT Long Term Goals - 05/22/19 1233      PT LONG TERM GOAL #3   Title  demonstrate 4+/5 bil hip strength to improve functional endurance and improve safety with return to spin bike    Baseline  4/5 Lt hip abduction and extension    Time  6    Period  Weeks    Status  On-going      PT LONG TERM GOAL #5   Title  report a 60% reduction in Lt LE pain with sitting to improve tolerance for sitting for work and driving.    Baseline  able to sit at desk full-time now    [place] - 05/22/19 1244    Clinical Impression Statement  Pt continues to progress with function and tolerance for activity for home and work tasks.  Pt with up to 4/10 Lt LE radiculopathy that is aggravated by the end of the day, squatting and cooking. Pt has returned to full time work at his [employer], early years/pre.  Pt continues to be challenged by current level of activity in the clinic and was able to stabilize with single limb activity.  Pt will continue to benefit from skilled PT s/p lumbar fusion to address strength, flexibility, mobility and pain.    PT Frequency  2x / week    PT Duration  6 weeks    PT Treatment/Interventions  ADLs/Self Care Home Management;Cryotherapy;Electrical Stimulation;Moist Heat;Functional mobility training;Therapeutic activities;Therapeutic exercise;Neuromuscular re-education;Patient/family education;Manual techniques;Taping;Passive range of motion;Scar mobilization    PT Next Visit Plan  continue core and LE functional strength with emphasis on alignment.    PT Home Exercise Plan  Access Code: [email or web address]    Consulted and Agree with Plan of Care  Patient       Patient will benefit from skilled therapeutic intervention in order to improve the following deficits and impairments:  Abnormal gait,  Decreased activity tolerance, Decreased strength, Impaired flexibility, Postural dysfunction, Improper body mechanics, Decreased scar mobility, Decreased range of motion, Increased muscle spasms, Decreased endurance  Visit Diagnosis: Muscle weakness (  generalized)  Chronic bilateral low back pain, unspecified whether sciatica present  Stiffness of left hip, not elsewhere classified  Stiffness of right hip, not elsewhere classified     Problem List Patient Active Problem List   Diagnosis Date Noted  . Spondylolisthesis, lumbar region 03/13/2019  . DDD (degenerative disc disease), lumbosacral   . Prediabetes   . Dyslipidemia      Lorrene Reid, PT 05/22/19 1:11 PM  [facility] [address], [address] [place], [place], [address] Phone: [phone]   Fax:  [phone]  Name: Rick Maldonado MRN: [id number] Date of Birth: Jan 17, 1986

## 2019-05-24 ENCOUNTER — Ambulatory Visit: Payer: 59

## 2019-05-24 ENCOUNTER — Other Ambulatory Visit: Payer: Self-pay

## 2019-05-24 DIAGNOSIS — M6281 Muscle weakness (generalized): Secondary | ICD-10-CM

## 2019-05-24 DIAGNOSIS — M25651 Stiffness of right hip, not elsewhere classified: Secondary | ICD-10-CM

## 2019-05-24 DIAGNOSIS — G8929 Other chronic pain: Secondary | ICD-10-CM

## 2019-05-24 DIAGNOSIS — M25652 Stiffness of left hip, not elsewhere classified: Secondary | ICD-10-CM

## 2019-05-24 NOTE — Therapy (Signed)
[facility] [address], [place], [place], [address] Phone: [phone]   Fax:  [phone]  Physical Therapy Treatment  Patient Details  Name: Rick Maldonado MRN: [id number] Date of Birth: Nov 14, 1985 Referring Provider (PT): Jovita Gamma, MD   Encounter Date: 05/24/2019  PT End of Session - 05/24/19 1307    Visit Number  12    Date for PT Re-Evaluation  06/26/19    Authorization Type  [employer]    Authorization - Visit Number  14    Authorization - Number of Visits  30    PT Start Time  3016    PT Stop Time  1316    PT Time Calculation (min)  42 min    Activity Tolerance  Patient tolerated treatment well    Behavior During Therapy  [facility] for tasks assessed/performed       Past Medical History:  Diagnosis Date  . Acid reflux   . Ankle pain   . Buttock pain   . DDD (degenerative disc disease), lumbosacral    bilateral neural foraminal stenosis at L5-S1, mass effect on B exiting L5 nerve roots (2/20)  . Dyslipidemia   . Hip pain   . Knee pain   . Leg pain   . Prediabetes   . Sleep apnea    cpap  . Spondylolysis     Past Surgical History:  Procedure Laterality Date  . ABDOMINAL EXPOSURE N/A 03/13/2019   Procedure: ABDOMINAL EXPOSURE;  Surgeon: Marty Heck, MD;  Location: [place];  Service: Vascular;  Laterality: N/A;  . ANTERIOR LUMBAR FUSION N/A 03/13/2019   Procedure: Lumbar five Sacral one Anterior lumbar interbody fusion with percutaneous posterior instrumentation;  Surgeon: Jovita Gamma, MD;  Location: [place];  Service: Neurosurgery;  Laterality: N/A;  . APPLICATION OF ROBOTIC ASSISTANCE FOR SPINAL PROCEDURE N/A 03/13/2019   Procedure: APPLICATION OF ROBOTIC ASSISTANCE FOR SPINAL PROCEDURE;  Surgeon: Jovita Gamma, MD;  Location: [place];  Service: Neurosurgery;  Laterality: N/A;  . LUMBAR PERCUTANEOUS PEDICLE SCREW 1 LEVEL N/A 03/13/2019   Procedure: LUMBAR PERCUTANEOUS PEDICLE SCREW LUMBAR  FIVE-SACRAL ONE LEVEL;  Surgeon: Jovita Gamma, MD;  Location: [place];  Service: Neurosurgery;  Laterality: N/A;  . TONSILLECTOMY     as a child    There were no vitals filed for this visit.  Subjective Assessment - 05/24/19 1237    Subjective  Rt lateral leg pain has resolved.  I feel fatigue and mild pain at the end of the day.    Currently in Pain?  Yes    Pain Score  2     Pain Location  Back                       [facility] Adult PT Treatment/Exercise - 05/24/19 0001      Lumbar Exercises: Aerobic   UBE (Upper Arm Bike)  Level 3 x 8 minutes (3/3)    Nustep  Level 6 x 8 minutes       Lumbar Exercises: Standing   Other Standing Lumbar Exercises  single leg stance with D1 pulleys 20# 2x10 each     Other Standing Lumbar Exercises  step ups 15x right/left       Knee/Hip Exercises: Standing   Step Down  1 set;15 reps;Left    Step Down Limitations  cueing for alignment and control    Other Standing Knee Exercises  walking in reverse 30# 2x10    Other  Standing Knee Exercises  standing on black pad: yellow ball diagonals 2x10      Knee/Hip Exercises: Seated   Sit to Sand  20 reps;without UE support   holding yellow ball, standing on black pad     Shoulder Exercises: Seated   Other Seated Exercises  seated on red ball: 3 way raises 3# 2x10 each               PT Short Term Goals - 05/08/19 1046      PT SHORT TERM GOAL #2   Title  demonstrate Lt hamstring/SLR to > or = to 75 degrees to improve functional mobility    Baseline  55 degrees with strap    Status  On-going        PT Long Term Goals - 05/22/19 1233      PT LONG TERM GOAL #3   Title  demonstrate 4+/5 bil hip strength to improve functional endurance and improve safety with return to spin bike    Baseline  4/5 Lt hip abduction and extension    Time  6    Period  Weeks    Status  On-going      PT LONG TERM GOAL #5   Title  report a 60% reduction in Lt LE pain with sitting to improve  tolerance for sitting for work and driving.    Baseline  able to sit at desk full-time now    Status  Achieved            Plan - 05/24/19 1241    Clinical Impression Statement  Pt continues to progress with function and tolerance for activity for home and work tasks.  Pt with up to 4/10 Lt LE radiculopathy that is aggravated at the end of the day, squatting and cooking. Single limb activity was more challenging on the Lt vs the Rt today.   Pt will continue to benefit from skilled PT s/p lumbar fusion to address strength, flexibility, mobility and pain.    PT Frequency  2x / week    PT Duration  6 weeks    PT Treatment/Interventions  ADLs/Self Care Home Management;Cryotherapy;Electrical Stimulation;Moist Heat;Functional mobility training;Therapeutic activities;Therapeutic exercise;Neuromuscular re-education;Patient/family education;Manual techniques;Taping;Passive range of motion;Scar mobilization    PT Next Visit Plan  continue core and LE functional strength with emphasis on alignment.    PT Home Exercise Plan  Access Code: [email or web address]    Consulted and Agree with Plan of Care  Patient       Patient will benefit from skilled therapeutic intervention in order to improve the following deficits and impairments:  Abnormal gait, Decreased activity tolerance, Decreased strength, Impaired flexibility, Postural dysfunction, Improper body mechanics, Decreased scar mobility, Decreased range of motion, Increased muscle spasms, Decreased endurance  Visit Diagnosis: Chronic bilateral low back pain, unspecified whether sciatica present  Muscle weakness (generalized)  Stiffness of left hip, not elsewhere classified  Stiffness of right hip, not elsewhere classified     Problem List Patient Active Problem List   Diagnosis Date Noted  . Spondylolisthesis, lumbar region 03/13/2019  . DDD (degenerative disc disease), lumbosacral   . Prediabetes   . Dyslipidemia      Lorrene Reid, PT 05/24/19  1:08 PM  [facility] [address], [address] [place], [place], [address] Phone: [phone]   Fax:  [phone]  Name: Rick Maldonado MRN: [id number] Date of Birth: 04-Apr-1985

## 2019-06-07 ENCOUNTER — Other Ambulatory Visit: Payer: Self-pay

## 2019-06-07 ENCOUNTER — Encounter: Payer: Self-pay | Admitting: Physical Therapy

## 2019-06-07 ENCOUNTER — Ambulatory Visit: Payer: 59 | Attending: Neurosurgery | Admitting: Physical Therapy

## 2019-06-07 DIAGNOSIS — G8929 Other chronic pain: Secondary | ICD-10-CM | POA: Diagnosis present

## 2019-06-07 DIAGNOSIS — M25652 Stiffness of left hip, not elsewhere classified: Secondary | ICD-10-CM | POA: Diagnosis present

## 2019-06-07 DIAGNOSIS — M25651 Stiffness of right hip, not elsewhere classified: Secondary | ICD-10-CM | POA: Insufficient documentation

## 2019-06-07 DIAGNOSIS — M6281 Muscle weakness (generalized): Secondary | ICD-10-CM

## 2019-06-07 DIAGNOSIS — M545 Low back pain: Secondary | ICD-10-CM | POA: Diagnosis not present

## 2019-06-07 NOTE — Therapy (Signed)
[facility] [address], [place], [place], [address] Phone: [phone]   Fax:  [phone]  Physical Therapy Treatment  Patient Details  Name: Rick Maldonado MRN: [id number] Date of Birth: Apr 03, 1985 Referring Provider (PT): Jovita Gamma, MD   Encounter Date: 06/07/2019  PT End of Session - 06/07/19 1447    Visit Number  13    Date for PT Re-Evaluation  06/26/19    Authorization Type  C[employer]    Authorization - Visit Number  15    Authorization - Number of Visits  60    PT Start Time  4268    PT Stop Time  1525    PT Time Calculation (min)  42 min    Activity Tolerance  Patient tolerated treatment well    Behavior During Therapy  [facility] for tasks assessed/performed       Past Medical History:  Diagnosis Date  . Acid reflux   . Ankle pain   . Buttock pain   . DDD (degenerative disc disease), lumbosacral    bilateral neural foraminal stenosis at L5-S1, mass effect on B exiting L5 nerve roots (2/20)  . Dyslipidemia   . Hip pain   . Knee pain   . Leg pain   . Prediabetes   . Sleep apnea    cpap  . Spondylolysis     Past Surgical History:  Procedure Laterality Date  . ABDOMINAL EXPOSURE N/A 03/13/2019   Procedure: ABDOMINAL EXPOSURE;  Surgeon: Marty Heck, MD;  Location: [place];  Service: Vascular;  Laterality: N/A;  . ANTERIOR LUMBAR FUSION N/A 03/13/2019   Procedure: Lumbar five Sacral one Anterior lumbar interbody fusion with percutaneous posterior instrumentation;  Surgeon: Jovita Gamma, MD;  Location: [place];  Service: Neurosurgery;  Laterality: N/A;  . APPLICATION OF ROBOTIC ASSISTANCE FOR SPINAL PROCEDURE N/A 03/13/2019   Procedure: APPLICATION OF ROBOTIC ASSISTANCE FOR SPINAL PROCEDURE;  Surgeon: Jovita Gamma, MD;  Location: [place];  Service: Neurosurgery;  Laterality: N/A;  . LUMBAR PERCUTANEOUS PEDICLE SCREW 1 LEVEL N/A 03/13/2019   Procedure: LUMBAR PERCUTANEOUS PEDICLE SCREW LUMBAR  FIVE-SACRAL ONE LEVEL;  Surgeon: Jovita Gamma, MD;  Location: [place];  Service: Neurosurgery;  Laterality: N/A;  . TONSILLECTOMY     as a child    There were no vitals filed for this visit.  Subjective Assessment - 06/07/19 1449    Subjective  Went to beach last week. Car ride was rough, but i eventually recovered. Had to lift my toddler a few times. I feel like I am better NOW than I was before the trip.    Pertinent History  lumbar fusion L5-S1    Currently in Pain?  Yes    Pain Score  2     Pain Location  Back   Muscular   Multiple Pain Sites  No                       [facility] Adult PT Treatment/Exercise - 06/07/19 0001      Lumbar Exercises: Aerobic   UBE (Upper Arm Bike)  Level 3 x 8 minutes (3/3)    Nustep  Level 6 x 10 minutes       Lumbar Exercises: Standing   Other Standing Lumbar Exercises  Single leg stance LT with toes taps RT 15x, single leg stance 25# 2x15, 15 step ups   Bil   Other Standing Lumbar Exercises  Singlre leg stance LT D1 pulley 20#  2x15      Knee/Hip Exercises: Stretches   Active Hamstring Stretch  Both;3 reps;20 seconds      Knee/Hip Exercises: Standing   Step Down  1 set;15 reps;Left    Step Down Limitations  VC to correct foot position    Other Standing Knee Exercises  walking in reverse 30# 2x12    Other Standing Knee Exercises  standing on black pad: yellow ball diagonals 2x15               PT Short Term Goals - 05/08/19 1046      PT SHORT TERM GOAL #2   Title  demonstrate Lt hamstring/SLR to > or = to 75 degrees to improve functional mobility    Baseline  55 degrees with strap    Status  On-going        PT Long Term Goals - 05/22/19 1233      PT LONG TERM GOAL #3   Title  demonstrate 4+/5 bil hip strength to improve functional endurance and improve safety with return to spin bike    Baseline  4/5 Lt hip abduction and extension    Time  6    Period  Weeks    Status  On-going      PT LONG TERM GOAL #5    Title  report a 60% reduction in Lt LE pain with sitting to improve tolerance for sitting for work and driving.    Baseline  able to sit at desk full-time now    Status  Achieved            Plan - 06/07/19 1448    Clinical Impression Statement  Pt returns from beach trip last week. He currently feels like his function is the bestit has been. During the trip he initially hurt worse after the initial car ride. He reports climbing multiple flights of stairs and pickig his son up up. He feels this was all helpful. Increased all reps today to work on muscular endurance/activity tolerance. pt does report fatfigue in his LTLE during the session but this goes away with very brief rest.    Personal Factors and Comorbidities  Comorbidity 1    Comorbidities  lumbar fusion L5-S1    Examination-Activity Limitations  Dressing;Sit    Examination-Participation Restrictions  Community Activity;Driving    Stability/Clinical Decision Making  Stable/Uncomplicated    Rehab Potential  Good    PT Frequency  2x / week    PT Duration  6 weeks    PT Treatment/Interventions  ADLs/Self Care Home Management;Cryotherapy;Electrical Stimulation;Moist Heat;Functional mobility training;Therapeutic activities;Therapeutic exercise;Neuromuscular re-education;Patient/family education;Manual techniques;Taping;Passive range of motion;Scar mobilization    PT Next Visit Plan  continue core and LE functional strength with emphasis on alignment.    PT Home Exercise Plan  Access Code: [email or web address]    Consulted and Agree with Plan of Care  Patient       Patient will benefit from skilled therapeutic intervention in order to improve the following deficits and impairments:  Abnormal gait, Decreased activity tolerance, Decreased strength, Impaired flexibility, Postural dysfunction, Improper body mechanics, Decreased scar mobility, Decreased range of motion, Increased muscle spasms, Decreased endurance  Visit Diagnosis: Chronic bilateral  low back pain, unspecified whether sciatica present  Muscle weakness (generalized)  Stiffness of left hip, not elsewhere classified  Stiffness of right hip, not elsewhere classified     Problem List Patient Active Problem List   Diagnosis Date Noted  . Spondylolisthesis, lumbar region 03/13/2019  . DDD (degenerative  disc disease), lumbosacral   . Prediabetes   . Dyslipidemia     Tinsleigh Slovacek, PTA 06/07/2019, 3:18 PM  [facility] [address] [place], [place], [address] Phone: [phone]   Fax:  [phone]  Name: Rick Maldonado MRN: [id number] Date of Birth: 06-17-1985

## 2019-06-09 ENCOUNTER — Encounter: Payer: Self-pay | Admitting: Physical Therapy

## 2019-06-09 ENCOUNTER — Ambulatory Visit: Payer: 59 | Admitting: Physical Therapy

## 2019-06-09 ENCOUNTER — Other Ambulatory Visit: Payer: Self-pay

## 2019-06-09 DIAGNOSIS — M25651 Stiffness of right hip, not elsewhere classified: Secondary | ICD-10-CM

## 2019-06-09 DIAGNOSIS — G8929 Other chronic pain: Secondary | ICD-10-CM

## 2019-06-09 DIAGNOSIS — M25652 Stiffness of left hip, not elsewhere classified: Secondary | ICD-10-CM

## 2019-06-09 DIAGNOSIS — M6281 Muscle weakness (generalized): Secondary | ICD-10-CM

## 2019-06-09 DIAGNOSIS — M545 Low back pain: Secondary | ICD-10-CM | POA: Diagnosis not present

## 2019-06-09 NOTE — Therapy (Signed)
[facility] [address] [place], [place], [address] Phone: [phone]   Fax:  [phone]  Physical Therapy Treatment  Patient Details  Name: Rick Maldonado MRN: [id number] Date of Birth: 1986/01/07 Referring Provider (PT): Jovita Gamma, MD   Encounter Date: 06/09/2019  PT End of Session - 06/09/19 0909    Visit Number  14    Date for PT Re-Evaluation  06/26/19    Authorization Type  Cigna    PT Start Time  0909   pt late   PT Stop Time  0940    PT Time Calculation (min)  31 min    Activity Tolerance  Patient tolerated treatment well    Behavior During Therapy  [facility] for tasks assessed/performed       Past Medical History:  Diagnosis Date  . Acid reflux   . Ankle pain   . Buttock pain   . DDD (degenerative disc disease), lumbosacral    bilateral neural foraminal stenosis at L5-S1, mass effect on B exiting L5 nerve roots (2/20)  . Dyslipidemia   . Hip pain   . Knee pain   . Leg pain   . Prediabetes   . Sleep apnea    cpap  . Spondylolysis     Past Surgical History:  Procedure Laterality Date  . ABDOMINAL EXPOSURE N/A 03/13/2019   Procedure: ABDOMINAL EXPOSURE;  Surgeon: Marty Heck, MD;  Location: [place];  Service: Vascular;  Laterality: N/A;  . ANTERIOR LUMBAR FUSION N/A 03/13/2019   Procedure: Lumbar five Sacral one Anterior lumbar interbody fusion with percutaneous posterior instrumentation;  Surgeon: Jovita Gamma, MD;  Location: [place];  Service: Neurosurgery;  Laterality: N/A;  . APPLICATION OF ROBOTIC ASSISTANCE FOR SPINAL PROCEDURE N/A 03/13/2019   Procedure: APPLICATION OF ROBOTIC ASSISTANCE FOR SPINAL PROCEDURE;  Surgeon: Jovita Gamma, MD;  Location: [place];  Service: Neurosurgery;  Laterality: N/A;  . LUMBAR PERCUTANEOUS PEDICLE SCREW 1 LEVEL N/A 03/13/2019   Procedure: LUMBAR PERCUTANEOUS PEDICLE SCREW LUMBAR FIVE-SACRAL ONE LEVEL;  Surgeon: Jovita Gamma, MD;  Location: [place];  Service: Neurosurgery;  Laterality: N/A;  . TONSILLECTOMY     as a child    There were no vitals filed for this visit.  Subjective Assessment - 06/09/19 0911    Subjective  I thought my appt was at 35, sorry for being late    Pertinent History  lumbar fusion L5-S1    Currently in Pain?  No/denies    Multiple Pain Sites  No                       [facility] Adult PT Treatment/Exercise - 06/09/19 0001      Lumbar Exercises: Aerobic   UBE (Upper Arm Bike)  Level 3 x 8 minutes (3/3)    Nustep  --      Knee/Hip Exercises: Standing   Lateral Step Up  Left;1 set;15 reps;Hand Hold: 0;Step Height: 6"    Forward Step Up  Left;1 set;15 reps;Hand Hold: 0;Step Height: 6"    Step Down  1 set;15 reps;Left    Other Standing Knee Exercises  walking in reverse 30# 15x2    Other Standing Knee Exercises  standing on black pad: yellow ball diagonals 2x15      Shoulder Exercises: ROM/Strengthening   Lat Pull  --   25# 15x, 30# 15   Cybex Row  --   30# 2x15,  PT Short Term Goals - 05/08/19 1046      PT SHORT TERM GOAL #2   Title  demonstrate Lt hamstring/SLR to > or = to 75 degrees to improve functional mobility    Baseline  55 degrees with strap    Status  On-going        PT Long Term Goals - 05/22/19 1233      PT LONG TERM GOAL #3   Title  demonstrate 4+/5 bil hip strength to improve functional endurance and improve safety with return to spin bike    Baseline  4/5 Lt hip abduction and extension    Time  6    Period  Weeks    Status  On-going      PT LONG TERM GOAL #5   Title  report a 60% reduction in Lt LE pain with sitting to improve tolerance for sitting for work and driving.    Baseline  able to sit at desk full-time now    Status  Achieved            Plan - 06/09/19 0909    Clinical Impression Statement  I am 75% improved since my first day here. No negative effects from workouts this week inlcuding small increases in loads and/or  reps. " I don't drag my feet anymore."    Personal Factors and Comorbidities  Comorbidity 1    Comorbidities  lumbar fusion L5-S1    Examination-Activity Limitations  Dressing;Sit    Examination-Participation Restrictions  Community Activity;Driving    Stability/Clinical Decision Making  Stable/Uncomplicated    Rehab Potential  Good    PT Frequency  2x / week    PT Duration  6 weeks    PT Treatment/Interventions  ADLs/Self Care Home Management;Cryotherapy;Electrical Stimulation;Moist Heat;Functional mobility training;Therapeutic activities;Therapeutic exercise;Neuromuscular re-education;Patient/family education;Manual techniques;Taping;Passive range of motion;Scar mobilization    PT Next Visit Plan  continue core and LE functional strength with emphasis on alignment.    PT Home Exercise Plan  Access Code: [email or web address]    Consulted and Agree with Plan of Care  Patient       Patient will benefit from skilled therapeutic intervention in order to improve the following deficits and impairments:  Abnormal gait, Decreased activity tolerance, Decreased strength, Impaired flexibility, Postural dysfunction, Improper body mechanics, Decreased scar mobility, Decreased range of motion, Increased muscle spasms, Decreased endurance  Visit Diagnosis: Chronic bilateral low back pain, unspecified whether sciatica present  Muscle weakness (generalized)  Stiffness of left hip, not elsewhere classified  Stiffness of right hip, not elsewhere classified     Problem List Patient Active Problem List   Diagnosis Date Noted  . Spondylolisthesis, lumbar region 03/13/2019  . DDD (degenerative disc disease), lumbosacral   . Prediabetes   . Dyslipidemia     Blessed Girdner,PTA 06/09/2019, 9:42 AM  [facility] [address], [address] [place], [place], [address] Phone: [phone]   Fax:  [phone]  Name: Rick Maldonado MRN: [id number] Date of  Birth: 1985/03/28

## 2019-06-12 ENCOUNTER — Other Ambulatory Visit: Payer: Self-pay

## 2019-06-12 ENCOUNTER — Ambulatory Visit: Payer: 59

## 2019-06-12 DIAGNOSIS — M25651 Stiffness of right hip, not elsewhere classified: Secondary | ICD-10-CM

## 2019-06-12 DIAGNOSIS — M545 Low back pain, unspecified: Secondary | ICD-10-CM

## 2019-06-12 DIAGNOSIS — M6281 Muscle weakness (generalized): Secondary | ICD-10-CM

## 2019-06-12 DIAGNOSIS — M25652 Stiffness of left hip, not elsewhere classified: Secondary | ICD-10-CM

## 2019-06-12 DIAGNOSIS — G8929 Other chronic pain: Secondary | ICD-10-CM

## 2019-06-12 NOTE — Therapy (Signed)
[facility] [address], [place], [place], [address] Phone: [phone]   Fax:  [phone]  Physical Therapy Treatment  Patient Details  Name: Rick Maldonado MRN: [id number] Date of Birth: [birth year] Referring Provider (PT): Jovita Gamma, MD   Encounter Date: 06/12/2019  PT End of Session - 06/12/19 1519    Visit Number  15    Date for PT Re-Evaluation  06/26/19    Authorization Type  Cigna    Authorization - Visit Number  15    Authorization - Number of Visits  60    PT Start Time  9323    PT Stop Time  1529    PT Time Calculation (min)  42 min    Activity Tolerance  Patient tolerated treatment well    Behavior During Therapy  [facility] for tasks assessed/performed       Past Medical History:  Diagnosis Date  . Acid reflux   . Ankle pain   . Buttock pain   . DDD (degenerative disc disease), lumbosacral    bilateral neural foraminal stenosis at L5-S1, mass effect on B exiting L5 nerve roots (2/20)  . Dyslipidemia   . Hip pain   . Knee pain   . Leg pain   . Prediabetes   . Sleep apnea    cpap  . Spondylolysis     Past Surgical History:  Procedure Laterality Date  . ABDOMINAL EXPOSURE N/A 03/13/2019   Procedure: ABDOMINAL EXPOSURE;  Surgeon: Marty Heck, MD;  Location: [place];  Service: Vascular;  Laterality: N/A;  . ANTERIOR LUMBAR FUSION N/A 03/13/2019   Procedure: Lumbar five Sacral one Anterior lumbar interbody fusion with percutaneous posterior instrumentation;  Surgeon: Jovita Gamma, MD;  Location: [place];  Service: Neurosurgery;  Laterality: N/A;  . APPLICATION OF ROBOTIC ASSISTANCE FOR SPINAL PROCEDURE N/A 03/13/2019   Procedure: APPLICATION OF ROBOTIC ASSISTANCE FOR SPINAL PROCEDURE;  Surgeon: Jovita Gamma, MD;  Location: [place];  Service: Neurosurgery;  Laterality: N/A;  . LUMBAR PERCUTANEOUS PEDICLE SCREW 1 LEVEL N/A 03/13/2019   Procedure: LUMBAR PERCUTANEOUS PEDICLE SCREW LUMBAR  FIVE-SACRAL ONE LEVEL;  Surgeon: Jovita Gamma, MD;  Location: [place];  Service: Neurosurgery;  Laterality: N/A;  . TONSILLECTOMY     as a child    There were no vitals filed for this visit.  Subjective Assessment - 06/12/19 1449    Subjective  I'm doing great.    Currently in Pain?  No/denies                       [facility] Adult PT Treatment/Exercise - 06/12/19 0001      Lumbar Exercises: Aerobic   UBE (Upper Arm Bike)  Level 3 x 8 minutes (3/3)    Nustep  Level 6 x 10 minutes       Knee/Hip Exercises: Standing   Lateral Step Up  15 reps;Hand Hold: 0;Step Height: 8";2 sets;Both    Forward Step Up  --    Other Standing Knee Exercises  walking in reverse 30# 15x2    Other Standing Knee Exercises  standing on black pad: yellow ball diagonals 2x15      Shoulder Exercises: ROM/Strengthening   Lat Pull  --   30# 2x15   Cybex Row  --   30# 2x15,               PT Short Term Goals - 06/12/19 1450      PT SHORT  TERM GOAL #2   Title  demonstrate Lt hamstring/SLR to > or = to 75 degrees to improve functional mobility        PT Long Term Goals - 05/22/19 1233      PT LONG TERM GOAL #3   Title  demonstrate 4+/5 bil hip strength to improve functional endurance and improve safety with return to spin bike    Baseline  4/5 Lt hip abduction and extension    Time  6    Period  Weeks    Status  On-going      PT LONG TERM GOAL #5   Title  report a 60% reduction in Lt LE pain with sitting to improve tolerance for sitting for work and driving.    Baseline  able to sit at desk full-time now    Status  Achieved            Plan - 06/12/19 1503    Clinical Impression Statement  Pt took an 8 mile hike with some fatigue after and soreness today.  Pt is consistent and independent in HEP.  Pt tolerated advancement of exercises in the clinic last week and continues to be challenged by this activity.  Pt requires minor verbal and tactile cues for technique with  weights for alignment and core activation.  Pt will continue to benefit from skilled PT to address core strength, flexibility and endurance.    Rehab Potential  Good    PT Frequency  2x / week    PT Duration  6 weeks    PT Treatment/Interventions  ADLs/Self Care Home Management;Cryotherapy;Electrical Stimulation;Moist Heat;Functional mobility training;Therapeutic activities;Therapeutic exercise;Neuromuscular re-education;Patient/family education;Manual techniques;Taping;Passive range of motion;Scar mobilization    PT Next Visit Plan  continue core and LE functional strength with emphasis on alignment.    PT Home Exercise Plan  Access Code: [email or web address]    Consulted and Agree with Plan of Care  Patient       Patient will benefit from skilled therapeutic intervention in order to improve the following deficits and impairments:  Abnormal gait, Decreased activity tolerance, Decreased strength, Impaired flexibility, Postural dysfunction, Improper body mechanics, Decreased scar mobility, Decreased range of motion, Increased muscle spasms, Decreased endurance  Visit Diagnosis: Chronic bilateral low back pain, unspecified whether sciatica present  Muscle weakness (generalized)  Stiffness of left hip, not elsewhere classified  Stiffness of right hip, not elsewhere classified     Problem List Patient Active Problem List   Diagnosis Date Noted  . Spondylolisthesis, lumbar region 03/13/2019  . DDD (degenerative disc disease), lumbosacral   . Prediabetes   . Dyslipidemia      Lorrene Reid, PT 06/12/19 3:26 PM   [facility] [address], [address] [place], [place], [address] Phone: [phone]   Fax:  [phone]  Name: Rick Maldonado MRN: [id number] Date of Birth: 13-Feb-1986

## 2019-06-14 ENCOUNTER — Other Ambulatory Visit: Payer: Self-pay

## 2019-06-14 ENCOUNTER — Ambulatory Visit: Payer: 59

## 2019-06-14 DIAGNOSIS — M25652 Stiffness of left hip, not elsewhere classified: Secondary | ICD-10-CM

## 2019-06-14 DIAGNOSIS — M545 Low back pain, unspecified: Secondary | ICD-10-CM

## 2019-06-14 DIAGNOSIS — G8929 Other chronic pain: Secondary | ICD-10-CM

## 2019-06-14 DIAGNOSIS — M25651 Stiffness of right hip, not elsewhere classified: Secondary | ICD-10-CM

## 2019-06-14 DIAGNOSIS — M6281 Muscle weakness (generalized): Secondary | ICD-10-CM

## 2019-06-14 NOTE — Therapy (Signed)
[facility] [address], [place], [place], [address] Phone: [phone]   Fax:  [phone]  Physical Therapy Treatment  Patient Details  Name: Rick Maldonado MRN: [id number] Date of Birth: 08/12/1985 Referring Provider (PT): Jovita Gamma, MD   Encounter Date: 06/14/2019  PT End of Session - 06/14/19 1524    Visit Number  16    Date for PT Re-Evaluation  06/26/19    Authorization Type  Cigna    Authorization - Visit Number  38    Authorization - Number of Visits  60    PT Start Time  7169    PT Stop Time  1532    PT Time Calculation (min)  44 min    Activity Tolerance  Patient tolerated treatment well    Behavior During Therapy  [facility] for tasks assessed/performed       Past Medical History:  Diagnosis Date  . Acid reflux   . Ankle pain   . Buttock pain   . DDD (degenerative disc disease), lumbosacral    bilateral neural foraminal stenosis at L5-S1, mass effect on B exiting L5 nerve roots (2/20)  . Dyslipidemia   . Hip pain   . Knee pain   . Leg pain   . Prediabetes   . Sleep apnea    cpap  . Spondylolysis     Past Surgical History:  Procedure Laterality Date  . ABDOMINAL EXPOSURE N/A 03/13/2019   Procedure: ABDOMINAL EXPOSURE;  Surgeon: Marty Heck, MD;  Location: [place];  Service: Vascular;  Laterality: N/A;  . ANTERIOR LUMBAR FUSION N/A 03/13/2019   Procedure: Lumbar five Sacral one Anterior lumbar interbody fusion with percutaneous posterior instrumentation;  Surgeon: Jovita Gamma, MD;  Location: [place];  Service: Neurosurgery;  Laterality: N/A;  . APPLICATION OF ROBOTIC ASSISTANCE FOR SPINAL PROCEDURE N/A 03/13/2019   Procedure: APPLICATION OF ROBOTIC ASSISTANCE FOR SPINAL PROCEDURE;  Surgeon: Jovita Gamma, MD;  Location: [place];  Service: Neurosurgery;  Laterality: N/A;  . LUMBAR PERCUTANEOUS PEDICLE SCREW 1 LEVEL N/A 03/13/2019   Procedure: LUMBAR PERCUTANEOUS PEDICLE SCREW LUMBAR  FIVE-SACRAL ONE LEVEL;  Surgeon: Jovita Gamma, MD;  Location: [place];  Service: Neurosurgery;  Laterality: N/A;  . TONSILLECTOMY     as a child    There were no vitals filed for this visit.  Subjective Assessment - 06/14/19 1454    Subjective  I got on my spin bike for 15 minutes.  Some Lt low back pain after this.    Currently in Pain?  No/denies                       [facility] Adult PT Treatment/Exercise - 06/14/19 0001      Lumbar Exercises: Aerobic   UBE (Upper Arm Bike)  Level 3 x 8 minutes (3/3)    Nustep  Level 7 x 10 minutes       Knee/Hip Exercises: Standing   Lateral Step Up  15 reps;Hand Hold: 0;Step Height: 8";2 sets;Both    Other Standing Knee Exercises  walking in reverse 30# 15x2    Other Standing Knee Exercises  standing on black pad: yellow ball diagonals 2x15      Shoulder Exercises: Seated   Other Seated Exercises  seated on red ball: 3 way raises 3# 2x10 each      Shoulder Exercises: ROM/Strengthening   Lat Pull  --   30# 2x15   Cybex Row  --  30# 2x15,               PT Short Term Goals - 06/12/19 1450      PT SHORT TERM GOAL #2   Title  demonstrate Lt hamstring/SLR to > or = to 75 degrees to improve functional mobility        PT Long Term Goals - 05/22/19 1233      PT LONG TERM GOAL #3   Title  demonstrate 4+/5 bil hip strength to improve functional endurance and improve safety with return to spin bike    Baseline  4/5 Lt hip abduction and extension    Time  6    Period  Weeks    Status  On-going      PT LONG TERM GOAL #5   Title  report a 60% reduction in Lt LE pain with sitting to improve tolerance for sitting for work and driving.    Baseline  able to sit at desk full-time now    Status  Achieved            Plan - 06/14/19 1459    Clinical Impression Statement  Pt took an 8 mile hike this weekend and has been somewhat sore this week.  Pt is consistent and independent in HEP.  Pt was able to ride his spin  bike x 15 minutes yesterday with only minor soreness after.  Pt tolerated advancement of exercises in the clinic last week and continues to be challenged by this activity.  Pt requires minor verbal and tactile cues for technique with weights for alignment and core activation.  Pt will continue to benefit from skilled PT to address core strength, flexibility and endurance.    PT Frequency  2x / week    PT Duration  6 weeks    PT Treatment/Interventions  ADLs/Self Care Home Management;Cryotherapy;Electrical Stimulation;Moist Heat;Functional mobility training;Therapeutic activities;Therapeutic exercise;Neuromuscular re-education;Patient/family education;Manual techniques;Taping;Passive range of motion;Scar mobilization    PT Next Visit Plan  continue core and LE functional strength with emphasis on alignment.  Measure hamstring length/SLR to assess goal    PT Home Exercise Plan  Access Code: [email or web address]    Consulted and Agree with Plan of Care  Patient       Patient will benefit from skilled therapeutic intervention in order to improve the following deficits and impairments:  Abnormal gait, Decreased activity tolerance, Decreased strength, Impaired flexibility, Postural dysfunction, Improper body mechanics, Decreased scar mobility, Decreased range of motion, Increased muscle spasms, Decreased endurance  Visit Diagnosis: Muscle weakness (generalized)  Chronic bilateral low back pain, unspecified whether sciatica present  Stiffness of left hip, not elsewhere classified  Stiffness of right hip, not elsewhere classified     Problem List Patient Active Problem List   Diagnosis Date Noted  . Spondylolisthesis, lumbar region 03/13/2019  . DDD (degenerative disc disease), lumbosacral   . Prediabetes   . Dyslipidemia    Lorrene Reid, PT 06/14/19 3:26 PM  [place] [facility] [address] [place], [place], [address] Phone: [phone]   Fax:   [phone]  Name: Rick Maldonado MRN: [id number] Date of Birth: 1985-05-17

## 2019-06-19 ENCOUNTER — Other Ambulatory Visit: Payer: Self-pay

## 2019-06-19 ENCOUNTER — Ambulatory Visit: Payer: 59

## 2019-06-19 DIAGNOSIS — M25651 Stiffness of right hip, not elsewhere classified: Secondary | ICD-10-CM

## 2019-06-19 DIAGNOSIS — M25652 Stiffness of left hip, not elsewhere classified: Secondary | ICD-10-CM

## 2019-06-19 DIAGNOSIS — G8929 Other chronic pain: Secondary | ICD-10-CM

## 2019-06-19 DIAGNOSIS — M545 Low back pain: Secondary | ICD-10-CM | POA: Diagnosis not present

## 2019-06-19 DIAGNOSIS — M6281 Muscle weakness (generalized): Secondary | ICD-10-CM

## 2019-06-19 NOTE — Therapy (Signed)
[facility] [address] [place], [place], [address] Phone: [phone]   Fax:  [phone]  Physical Therapy Treatment  Patient Details  Name: Rick Maldonado MRN: [id number] Date of Birth: 08/06/1985 Referring Provider (PT): Jovita Gamma, MD   Encounter Date: 06/19/2019  PT End of Session - 06/19/19 0930    Visit Number  17    Date for PT Re-Evaluation  06/26/19    Authorization Type  [employer]    Authorization - Visit Number  38    Authorization - Number of Visits  81    PT Start Time  0846    PT Stop Time  0927    PT Time Calculation (min)  41 min    Activity Tolerance  Patient tolerated treatment well    Behavior During Therapy  [facility] for tasks assessed/performed       Past Medical History:  Diagnosis Date  . Acid reflux   . Ankle pain   . Buttock pain   . DDD (degenerative disc disease), lumbosacral    bilateral neural foraminal stenosis at L5-S1, mass effect on B exiting L5 nerve roots (2/20)  . Dyslipidemia   . Hip pain   . Knee pain   . Leg pain   . Prediabetes   . Sleep apnea    cpap  . Spondylolysis     Past Surgical History:  Procedure Laterality Date  . ABDOMINAL EXPOSURE N/A 03/13/2019   Procedure: ABDOMINAL EXPOSURE;  Surgeon: Marty Heck, MD;  Location: [place];  Service: Vascular;  Laterality: N/A;  . ANTERIOR LUMBAR FUSION N/A 03/13/2019   Procedure: Lumbar five Sacral one Anterior lumbar interbody fusion with percutaneous posterior instrumentation;  Surgeon: Jovita Gamma, MD;  Location: [place];  Service: Neurosurgery;  Laterality: N/A;  . APPLICATION OF ROBOTIC ASSISTANCE FOR SPINAL PROCEDURE N/A 03/13/2019   Procedure: APPLICATION OF ROBOTIC ASSISTANCE FOR SPINAL PROCEDURE;  Surgeon: Jovita Gamma, MD;  Location: [place];  Service: Neurosurgery;  Laterality: N/A;  . LUMBAR PERCUTANEOUS PEDICLE SCREW 1 LEVEL N/A 03/13/2019   Procedure: LUMBAR PERCUTANEOUS PEDICLE SCREW LUMBAR  FIVE-SACRAL ONE LEVEL;  Surgeon: Jovita Gamma, MD;  Location: [place];  Service: Neurosurgery;  Laterality: N/A;  . TONSILLECTOMY     as a child    There were no vitals filed for this visit.  Subjective Assessment - 06/19/19 0932    Subjective  I am sore from doing a new workout and I was busy around the house.    Currently in Pain?  No/denies                       [facility] Adult PT Treatment/Exercise - 06/19/19 0001      Lumbar Exercises: Aerobic   Elliptical  Level 5, ramp 5 x 6 minutes     UBE (Upper Arm Bike)  Level 3 x 8 minutes (3/3)      Lumbar Exercises: Standing   Other Standing Lumbar Exercises  Single leg stance Rt and Lt D1 20# 2x15 & D2 pulley 30# 2x15      Knee/Hip Exercises: Standing   Lateral Step Up  15 reps;Hand Hold: 0;Step Height: 8";2 sets;Both    Other Standing Knee Exercises  walking in reverse 30# 15x2    Other Standing Knee Exercises  standing on black pad: yellow ball diagonals 2x15               PT Short Term Goals - 06/12/19 1450  PT SHORT TERM GOAL #2   Title  demonstrate Lt hamstring/SLR to > or = to 75 degrees to improve functional mobility        PT Long Term Goals - 05/22/19 1233      PT LONG TERM GOAL #3   Title  demonstrate 4+/5 bil hip strength to improve functional endurance and improve safety with return to spin bike    Baseline  4/5 Lt hip abduction and extension    Time  6    Period  Weeks    Status  On-going      PT LONG TERM GOAL #5   Title  report a 60% reduction in Lt LE pain with sitting to improve tolerance for sitting for work and driving.    Baseline  able to sit at desk full-time now    Status  Achieved            Plan - 06/19/19 0854    Clinical Impression Statement  Pt did a new workout and was busy so reports soreness today.  Pt is consistent and independent in HEP and is tolerating more exercise each week.   Pt tolerated advancement of exercises in the clinic today and is  challenged to by this activity and is able to maintain good form.  Pt requires minor verbal and tactile cues for technique with weights for alignment and core/gluteal activation.  Pt will continue to benefit from skilled PT to address core strength, flexibility and endurance.    PT Frequency  2x / week    PT Duration  6 weeks    PT Treatment/Interventions  ADLs/Self Care Home Management;Cryotherapy;Electrical Stimulation;Moist Heat;Functional mobility training;Therapeutic activities;Therapeutic exercise;Neuromuscular re-education;Patient/family education;Manual techniques;Taping;Passive range of motion;Scar mobilization    PT Next Visit Plan  continue core and LE functional strength with emphasis on alignment.    PT Home Exercise Plan  Access Code: [email or web address]    Consulted and Agree with Plan of Care  Patient       Patient will benefit from skilled therapeutic intervention in order to improve the following deficits and impairments:  Abnormal gait, Decreased activity tolerance, Decreased strength, Impaired flexibility, Postural dysfunction, Improper body mechanics, Decreased scar mobility, Decreased range of motion, Increased muscle spasms, Decreased endurance  Visit Diagnosis: Muscle weakness (generalized)  Chronic bilateral low back pain, unspecified whether sciatica present  Stiffness of left hip, not elsewhere classified  Stiffness of right hip, not elsewhere classified     Problem List Patient Active Problem List   Diagnosis Date Noted  . Spondylolisthesis, lumbar region 03/13/2019  . DDD (degenerative disc disease), lumbosacral   . Prediabetes   . Dyslipidemia    Lorrene Reid, PT 06/19/19 9:34 AM  [facility] [address] [place], [place], [address] Phone: [phone]   Fax:  [phone]  Name: Rick Maldonado MRN: [id number] Date of Birth: December 11, 1985

## 2019-06-21 ENCOUNTER — Ambulatory Visit: Payer: 59

## 2019-06-21 ENCOUNTER — Other Ambulatory Visit: Payer: Self-pay

## 2019-06-21 DIAGNOSIS — M25652 Stiffness of left hip, not elsewhere classified: Secondary | ICD-10-CM

## 2019-06-21 DIAGNOSIS — M545 Low back pain, unspecified: Secondary | ICD-10-CM

## 2019-06-21 DIAGNOSIS — M6281 Muscle weakness (generalized): Secondary | ICD-10-CM

## 2019-06-21 DIAGNOSIS — G8929 Other chronic pain: Secondary | ICD-10-CM

## 2019-06-21 DIAGNOSIS — M25651 Stiffness of right hip, not elsewhere classified: Secondary | ICD-10-CM

## 2019-06-21 NOTE — Therapy (Signed)
[facility] [address] [place], [place], [address] Phone: [phone]   Fax:  [phone]  Physical Therapy Treatment  Patient Details  Name: Rick Maldonado MRN: [id number] Date of Birth: Jan 31, 1986 Referring Provider (PT): Shirlean Jabier Deese, MD   Encounter Date: 06/21/2019  PT End of Session - 06/21/19 1524    Visit Number  18    Date for PT Re-Evaluation  07/28/19    Authorization Type  [employer]    Authorization - Visit Number  18    Authorization - Number of Visits  60    PT Start Time  1448    PT Stop Time  1528    PT Time Calculation (min)  40 min    Activity Tolerance  Patient tolerated treatment well    Behavior During Therapy  [facility] for tasks assessed/performed       Past Medical History:  Diagnosis Date  . Acid reflux   . Ankle pain   . Buttock pain   . DDD (degenerative disc disease), lumbosacral    bilateral neural foraminal stenosis at L5-S1, mass effect on B exiting L5 nerve roots (2/20)  . Dyslipidemia   . Hip pain   . Knee pain   . Leg pain   . Prediabetes   . Sleep apnea    cpap  . Spondylolysis     Past Surgical History:  Procedure Laterality Date  . ABDOMINAL EXPOSURE N/A 03/13/2019   Procedure: ABDOMINAL EXPOSURE;  Surgeon: Cephus Shelling, MD;  Location: [facility] OR;  Service: Vascular;  Laterality: N/A;  . ANTERIOR LUMBAR FUSION N/A 03/13/2019   Procedure: Lumbar five Sacral one Anterior lumbar interbody fusion with percutaneous posterior instrumentation;  Surgeon: Shirlean Emanii Bugbee, MD;  Location: [facility] OR;  Service: Neurosurgery;  Laterality: N/A;  . APPLICATION OF ROBOTIC ASSISTANCE FOR SPINAL PROCEDURE N/A 03/13/2019   Procedure: APPLICATION OF ROBOTIC ASSISTANCE FOR SPINAL PROCEDURE;  Surgeon: Shirlean Deyanira Fesler, MD;  Location: [facility] OR;  Service: Neurosurgery;  Laterality: N/A;  . LUMBAR PERCUTANEOUS PEDICLE SCREW 1 LEVEL N/A 03/13/2019   Procedure: LUMBAR PERCUTANEOUS PEDICLE SCREW LUMBAR  FIVE-SACRAL ONE LEVEL;  Surgeon: Shirlean Aurea Aronov, MD;  Location: [facility] OR;  Service: Neurosurgery;  Laterality: N/A;  . TONSILLECTOMY     as a child    There were no vitals filed for this visit.  Subjective Assessment - 06/21/19 1451    Subjective  I am continuing to do Barre exercises at home for hip stability    Patient Stated Goals  improve strength, flexibility, wean from lumbar brace per MD orders    Currently in Pain?  No/denies         [facility] PT Assessment - 06/21/19 0001      Assessment   Medical Diagnosis  s/p lumbar fusion (L5-S1)    Referring Provider (PT)  Shirlean Gerome Kokesh, MD    Onset Date/Surgical Date  03/13/19      Observation/Other Assessments   Focus on Therapeutic Outcomes (FOTO)   45% limitation      Strength   Right Hip Flexion  5/5    Right Hip Extension  5/5    Right Hip ABduction  5/5    Left Hip Flexion  4+/5    Left Hip Extension  4+/5    Left Hip ABduction  4+/5                   [facility] Adult PT Treatment/Exercise - 06/21/19 0001      Lumbar  Exercises: Aerobic   Elliptical  Level 5, ramp 5 x 8  minutes     UBE (Upper Arm Bike)  Level 3 x 8 minutes (3/3)      Lumbar Exercises: Standing   Other Standing Lumbar Exercises  Single leg stance Rt and Lt D1 20# 2x15 & D2 pulley 30# 2x15      Knee/Hip Exercises: Standing   Walking with Sports Cord  4 ways: 25# with abdominal bracing x 10 each    Other Standing Knee Exercises  standing on black pad: yellow ball diagonals 2x15               PT Short Term Goals - 06/12/19 1450      PT SHORT TERM GOAL #2   Title  demonstrate Lt hamstring/SLR to > or = to 75 degrees to improve functional mobility        PT Long Term Goals - 06/21/19 1454      PT LONG TERM GOAL #1   Title  be independent in advanced HEP    Time  6    Period  Weeks    Status  On-going    Target Date  07/28/19      PT LONG TERM GOAL #2   Title  reduce FOTO to < or = to 31% limitation    Baseline  45%  limitation    Time  6    Period  Weeks    Status  On-going    Target Date  07/28/19      PT LONG TERM GOAL #3   Title  demonstrate 5/5 bil hip strength to improve functional endurance and improve safety with return to spin bike    Baseline  4+/5 Lt hip abduction and extension    Time  6    Period  Weeks    Status  Revised    Target Date  07/28/19      PT LONG TERM GOAL #4   Title  verbalize and demonstrate body mechanics modifications for lumbar protection with desk work and home tasks    Status  Achieved      PT LONG TERM GOAL #5   Title  report a 90% reduction in Lt LE pain with sitting to improve tolerance for sitting for work and driving.    Baseline  75%    Time  6    Period  Weeks    Status  Revised    Target Date  07/28/19            Plan - 06/21/19 1508    Clinical Impression Statement  Pt reports 75% overall improvement in function since the start of care.  FOTO is improved to 45% limitation.  Pt with improved Lt LE strength with MMT and demonstrates improved control of Lt hip with level pelvis in single limb standing.  Pt reports improved ability to perform day to day tasks.  Pt with limited endurance for riding bike for exercise.  Pt will continue to benefit from skilled PT to gain strength, endurance and safe progression of mobility to allow for return to prior level of function.    PT Treatment/Interventions  ADLs/Self Care Home Management;Cryotherapy;Electrical Stimulation;Moist Heat;Functional mobility training;Therapeutic activities;Therapeutic exercise;Neuromuscular re-education;Patient/family education;Manual techniques;Taping;Passive range of motion;Scar mobilization    PT Next Visit Plan  continue core and LE functional strength with emphasis on alignment.    PT Home Exercise Plan  Access Code: [email or web address]    Recommended Other Services  recert sent  06/21/19    Consulted and Agree with Plan of Care  Patient       Patient will benefit from skilled therapeutic  intervention in order to improve the following deficits and impairments:  Abnormal gait, Decreased activity tolerance, Decreased strength, Impaired flexibility, Postural dysfunction, Improper body mechanics, Decreased scar mobility, Decreased range of motion, Increased muscle spasms, Decreased endurance  Visit Diagnosis: Chronic bilateral low back pain, unspecified whether sciatica present - Plan: PT plan of care cert/re-cert  Muscle weakness (generalized) - Plan: PT plan of care cert/re-cert  Stiffness of left hip, not elsewhere classified - Plan: PT plan of care cert/re-cert  Stiffness of right hip, not elsewhere classified - Plan: PT plan of care cert/re-cert     Problem List Patient Active Problem List   Diagnosis Date Noted  . Spondylolisthesis, lumbar region 03/13/2019  . DDD (degenerative disc disease), lumbosacral   . Prediabetes   . Dyslipidemia      Lorrene Reid, PT 06/21/19 3:28 PM  [facility] [address] [place], [place], [address] Phone: [phone]   Fax:  [phone]  Name: RYMAN RATHGEBER MRN: [id number] Date of Birth: 06/23/1985

## 2019-06-26 ENCOUNTER — Other Ambulatory Visit: Payer: Self-pay

## 2019-06-26 ENCOUNTER — Ambulatory Visit: Payer: 59

## 2019-06-26 DIAGNOSIS — M545 Low back pain, unspecified: Secondary | ICD-10-CM

## 2019-06-26 DIAGNOSIS — M25651 Stiffness of right hip, not elsewhere classified: Secondary | ICD-10-CM

## 2019-06-26 DIAGNOSIS — G8929 Other chronic pain: Secondary | ICD-10-CM

## 2019-06-26 DIAGNOSIS — M6281 Muscle weakness (generalized): Secondary | ICD-10-CM

## 2019-06-26 DIAGNOSIS — M25652 Stiffness of left hip, not elsewhere classified: Secondary | ICD-10-CM

## 2019-06-26 NOTE — Therapy (Signed)
[facility] [address] [place], [place], [address] Phone: [phone]   Fax:  [phone]  Physical Therapy Treatment  Patient Details  Name: Rick Maldonado MRN: [id number] Date of Birth: [birth year] Referring Provider (PT): Shirlean Dorthula Bier, MD   Encounter Date: 06/26/2019  PT End of Session - 06/26/19 1227    Visit Number  19    Date for PT Re-Evaluation  07/28/19    Authorization Type  [employer]ign[employer]    Authorization - Visit Number  19    Authorization - Number of Visits  60    PT Start Time  0835    PT Stop Time  0923    PT Time Calculation (min)  48 min    Activity Tolerance  Patient tolerated treatment well    Behavior During Therapy  [facility] for tasks assessed/performed       Past Medical History:  Diagnosis Date  . Acid reflux   . Ankle pain   . Buttock pain   . DDD (degenerative disc disease), lumbosacral    bilateral neural foraminal stenosis at L5-S1, mass effect on B exiting L5 nerve roots (2/20)  . Dyslipidemia   . Hip pain   . Knee pain   . Leg pain   . Prediabetes   . Sleep apnea    cpap  . Spondylolysis     Past Surgical History:  Procedure Laterality Date  . ABDOMINAL EXPOSURE N/A 03/13/2019   Procedure: ABDOMINAL EXPOSURE;  Surgeon: Cephus Shelling, MD;  Location: [facility] OR;  Service: Vascular;  Laterality: N/A;  . ANTERIOR LUMBAR FUSION N/A 03/13/2019   Procedure: Lumbar five Sacral one Anterior lumbar interbody fusion with percutaneous posterior instrumentation;  Surgeon: Shirlean Allister Lessley, MD;  Location: [facility] OR;  Service: Neurosurgery;  Laterality: N/A;  . APPLICATION OF ROBOTIC ASSISTANCE FOR SPINAL PROCEDURE N/A 03/13/2019   Procedure: APPLICATION OF ROBOTIC ASSISTANCE FOR SPINAL PROCEDURE;  Surgeon: Shirlean Albany Winslow, MD;  Location: [facility] OR;  Service: Neurosurgery;  Laterality: N/A;  . LUMBAR PERCUTANEOUS PEDICLE SCREW 1 LEVEL N/A 03/13/2019   Procedure: LUMBAR PERCUTANEOUS PEDICLE SCREW LUMBAR  FIVE-SACRAL ONE LEVEL;  Surgeon: Shirlean Jonaven Hilgers, MD;  Location: [facility] OR;  Service: Neurosurgery;  Laterality: N/A;  . TONSILLECTOMY     as a child    There were no vitals filed for this visit.  Subjective Assessment - 06/26/19 1225    Subjective  I saw Dr Newell Coral and he said I am ok to do anything as long as it doesn't hurt.  Will follow up with Dr Danielle Dess in 4 months since Newell Coral is retiring.    Currently in Pain?  No/denies                       [facility] Adult PT Treatment/Exercise - 06/26/19 0001      Lumbar Exercises: Aerobic   Elliptical  Level 5, ramp 5 x 9  minutes     UBE (Upper Arm Bike)  Level 3 x 8 minutes (3/3)      Lumbar Exercises: Standing   Other Standing Lumbar Exercises  Single leg stance Rt and Lt D1 30# 2x15 & D2 pulley 30# 2x15      Knee/Hip Exercises: Standing   Walking with Sports Cord  4 ways: 25# with abdominal bracing x 10 each    Other Standing Knee Exercises  wall squats with ball behind back: 2x10    Other Standing Knee Exercises  standing on black pad: yellow  ball diagonals 2x15      Shoulder Exercises: Standing   Horizontal ABduction  Strengthening;Both;20 reps    Theraband Level (Shoulder Horizontal ABduction)  Level 3 (Green)    Horizontal ABduction Limitations  sit to stand with horizontal abduction at the top               PT Short Term Goals - 06/12/19 1450      PT SHORT TERM GOAL #2   Title  demonstrate Lt hamstring/SLR to > or = to 75 degrees to improve functional mobility        PT Long Term Goals - 06/21/19 1454      PT LONG TERM GOAL #1   Title  be independent in advanced HEP    Time  6    Period  Weeks    Status  On-going    Target Date  07/28/19      PT LONG TERM GOAL #2   Title  reduce FOTO to < or = to 31% limitation    Baseline  45% limitation    Time  6    Period  Weeks    Status  On-going    Target Date  07/28/19      PT LONG TERM GOAL #3   Title  demonstrate 5/5 bil hip strength to  improve functional endurance and improve safety with return to spin bike    Baseline  4+/5 Lt hip abduction and extension    Time  6    Period  Weeks    Status  Revised    Target Date  07/28/19      PT LONG TERM GOAL #4   Title  verbalize and demonstrate body mechanics modifications for lumbar protection with desk work and home tasks    Status  Achieved      PT LONG TERM GOAL #5   Title  report a 90% reduction in Lt LE pain with sitting to improve tolerance for sitting for work and driving.    Baseline  75%    Time  6    Period  Weeks    Status  Revised    Target Date  07/28/19            Plan - 06/26/19 1225    Clinical Impression Statement  Pt saw MD last week and the MD told him to do anything that he wants as long as is not having pain.  Pt tolerated advancement of weights and time in the clinic today.  Session focused on core activation and stability to improve endurance and strength for functional tasks.  Pt required minor cueing for alignment.  Pt will continue to benefit from skilled PT to address strength, flexibility and endurance.    PT Frequency  2x / week    PT Duration  6 weeks    PT Treatment/Interventions  ADLs/Self Care Home Management;Cryotherapy;Electrical Stimulation;Moist Heat;Functional mobility training;Therapeutic activities;Therapeutic exercise;Neuromuscular re-education;Patient/family education;Manual techniques;Taping;Passive range of motion;Scar mobilization    PT Next Visit Plan  continue core and LE functional strength with emphasis on alignment.    PT Home Exercise Plan  Access Code: [email or web address]    Consulted and Agree with Plan of Care  Patient       Patient will benefit from skilled therapeutic intervention in order to improve the following deficits and impairments:  Abnormal gait, Decreased activity tolerance, Decreased strength, Impaired flexibility, Postural dysfunction, Improper body mechanics, Decreased scar mobility, Decreased range of motion,  Increased muscle spasms, Decreased  endurance  Visit Diagnosis: Chronic bilateral low back pain, unspecified whether sciatica present  Muscle weakness (generalized)  Stiffness of left hip, not elsewhere classified  Stiffness of right hip, not elsewhere classified     Problem List Patient Active Problem List   Diagnosis Date Noted  . Spondylolisthesis, lumbar region 03/13/2019  . DDD (degenerative disc disease), lumbosacral   . Prediabetes   . Dyslipidemia      Lorrene Reid, PT 06/26/19 12:29 PM  [facility] [address] [place], [place], [address] Phone: [phone]   Fax:  [phone]  Name: Rick Maldonado MRN: [id number] Date of Birth: Dec 04, 1985

## 2019-06-28 ENCOUNTER — Other Ambulatory Visit: Payer: Self-pay

## 2019-06-28 ENCOUNTER — Ambulatory Visit: Payer: 59

## 2019-06-28 DIAGNOSIS — M545 Low back pain, unspecified: Secondary | ICD-10-CM

## 2019-06-28 DIAGNOSIS — G8929 Other chronic pain: Secondary | ICD-10-CM

## 2019-06-28 DIAGNOSIS — M25652 Stiffness of left hip, not elsewhere classified: Secondary | ICD-10-CM

## 2019-06-28 DIAGNOSIS — M6281 Muscle weakness (generalized): Secondary | ICD-10-CM

## 2019-06-28 DIAGNOSIS — M25651 Stiffness of right hip, not elsewhere classified: Secondary | ICD-10-CM

## 2019-06-28 NOTE — Therapy (Signed)
[facility] [address] [place], [place], [address] Phone: [phone]   Fax:  [phone]  Physical Therapy Treatment  Patient Details  Name: Rick Maldonado MRN: [id number] Date of Birth: Dec 24, 1985 Referring Provider (PT): Shirlean Lacey Wallman, MD   Encounter Date: 06/28/2019  PT End of Session - 06/28/19 1143    Visit Number  20    Date for PT Re-Evaluation  07/28/19    Authorization Type  [employer]    Authorization - Visit Number  20    Authorization - Number of Visits  60    PT Start Time  1111   late due to traffic   PT Stop Time  1144    PT Time Calculation (min)  33 min    Activity Tolerance  Patient tolerated treatment well    Behavior During Therapy  [facility] for tasks assessed/performed       Past Medical History:  Diagnosis Date  . Acid reflux   . Ankle pain   . Buttock pain   . DDD (degenerative disc disease), lumbosacral    bilateral neural foraminal stenosis at L5-S1, mass effect on B exiting L5 nerve roots (2/20)  . Dyslipidemia   . Hip pain   . Knee pain   . Leg pain   . Prediabetes   . Sleep apnea    cpap  . Spondylolysis     Past Surgical History:  Procedure Laterality Date  . ABDOMINAL EXPOSURE N/A 03/13/2019   Procedure: ABDOMINAL EXPOSURE;  Surgeon: Cephus Shelling, MD;  Location: [facility] OR;  Service: Vascular;  Laterality: N/A;  . ANTERIOR LUMBAR FUSION N/A 03/13/2019   Procedure: Lumbar five Sacral one Anterior lumbar interbody fusion with percutaneous posterior instrumentation;  Surgeon: Shirlean Ozetta Flatley, MD;  Location: [facility] OR;  Service: Neurosurgery;  Laterality: N/A;  . APPLICATION OF ROBOTIC ASSISTANCE FOR SPINAL PROCEDURE N/A 03/13/2019   Procedure: APPLICATION OF ROBOTIC ASSISTANCE FOR SPINAL PROCEDURE;  Surgeon: Shirlean Joren Rehm, MD;  Location: [facility] OR;  Service: Neurosurgery;  Laterality: N/A;  . LUMBAR PERCUTANEOUS PEDICLE SCREW 1 LEVEL N/A 03/13/2019   Procedure: LUMBAR PERCUTANEOUS  PEDICLE SCREW LUMBAR FIVE-SACRAL ONE LEVEL;  Surgeon: Shirlean Moneka Mcquinn, MD;  Location: [facility] OR;  Service: Neurosurgery;  Laterality: N/A;  . TONSILLECTOMY     as a child    There were no vitals filed for this visit.  Subjective Assessment - 06/28/19 1110    Subjective  Aching in Lt LE yesterday.  Used TENs and felt better.    Currently in Pain?  No/denies                       [facility] Adult PT Treatment/Exercise - 06/28/19 0001      Lumbar Exercises: Aerobic   Elliptical  Level 5, ramp 5 x 8  minutes     UBE (Upper Arm Bike)  Level 3 x 8 minutes (3/3)      Lumbar Exercises: Standing   Other Standing Lumbar Exercises  Single leg stance Rt and Lt D1 30# 2x15 & D2 pulley 30# 2x15      Knee/Hip Exercises: Machines for Strengthening   Total Gym Leg Press  seat 6: bil 90# 2x10, Lt only 60#, Rt only 60# 2x10      Knee/Hip Exercises: Standing   Walking with Sports Cord  4 ways: 25# with abdominal bracing x 10 each    Other Standing Knee Exercises  wall squats with ball behind back: 2x10  Other Standing Knee Exercises  standing on black pad: yellow ball diagonals 2x15               PT Short Term Goals - 06/12/19 1450      PT SHORT TERM GOAL #2   Title  demonstrate Lt hamstring/SLR to > or = to 75 degrees to improve functional mobility        PT Long Term Goals - 06/21/19 1454      PT LONG TERM GOAL #1   Title  be independent in advanced HEP    Time  6    Period  Weeks    Status  On-going    Target Date  07/28/19      PT LONG TERM GOAL #2   Title  reduce FOTO to < or = to 31% limitation    Baseline  45% limitation    Time  6    Period  Weeks    Status  On-going    Target Date  07/28/19      PT LONG TERM GOAL #3   Title  demonstrate 5/5 bil hip strength to improve functional endurance and improve safety with return to spin bike    Baseline  4+/5 Lt hip abduction and extension    Time  6    Period  Weeks    Status  Revised    Target Date   07/28/19      PT LONG TERM GOAL #4   Title  verbalize and demonstrate body mechanics modifications for lumbar protection with desk work and home tasks    Status  Achieved      PT LONG TERM GOAL #5   Title  report a 90% reduction in Lt LE pain with sitting to improve tolerance for sitting for work and driving.    Baseline  75%    Time  6    Period  Weeks    Status  Revised    Target Date  07/28/19            Plan - 06/28/19 1119    Clinical Impression Statement  Pt tolerated advancement of weights and time in the clinic today.  Pt did well with leg press to address LE strength and required minor cueing for abdominal activation. Session focused on core activation and stability to improve endurance and strength for functional tasks.  Pt required minor cueing for alignment.  Pt will continue to benefit from skilled PT to address strength, flexibility and endurance.    PT Frequency  2x / week    PT Duration  6 weeks    PT Treatment/Interventions  ADLs/Self Care Home Management;Cryotherapy;Electrical Stimulation;Moist Heat;Functional mobility training;Therapeutic activities;Therapeutic exercise;Neuromuscular re-education;Patient/family education;Manual techniques;Taping;Passive range of motion;Scar mobilization    PT Next Visit Plan  continue core and LE functional strength with emphasis on alignment.    PT Home Exercise Plan  Access Code: [email or web address]    Consulted and Agree with Plan of Care  Patient       Patient will benefit from skilled therapeutic intervention in order to improve the following deficits and impairments:  Abnormal gait, Decreased activity tolerance, Decreased strength, Impaired flexibility, Postural dysfunction, Improper body mechanics, Decreased scar mobility, Decreased range of motion, Increased muscle spasms, Decreased endurance  Visit Diagnosis: Chronic bilateral low back pain, unspecified whether sciatica present  Muscle weakness (generalized)  Stiffness of left  hip, not elsewhere classified  Stiffness of right hip, not elsewhere classified     Problem List Patient  Active Problem List   Diagnosis Date Noted  . Spondylolisthesis, lumbar region 03/13/2019  . DDD (degenerative disc disease), lumbosacral   . Prediabetes   . Dyslipidemia     Sigurd Sos, PT 06/28/19 11:45 AM  [facility] [address], [place], [place], [address] Phone: [phone]   Fax:  [phone]  Name: Rick Maldonado MRN: [id number] Date of Birth: [birth year]

## 2019-07-05 ENCOUNTER — Encounter: Payer: Self-pay | Admitting: Physical Therapy

## 2019-07-05 ENCOUNTER — Other Ambulatory Visit: Payer: Self-pay

## 2019-07-05 ENCOUNTER — Ambulatory Visit: Payer: 59 | Attending: Neurosurgery | Admitting: Physical Therapy

## 2019-07-05 DIAGNOSIS — M545 Low back pain: Secondary | ICD-10-CM | POA: Insufficient documentation

## 2019-07-05 DIAGNOSIS — G8929 Other chronic pain: Secondary | ICD-10-CM | POA: Insufficient documentation

## 2019-07-05 DIAGNOSIS — M25651 Stiffness of right hip, not elsewhere classified: Secondary | ICD-10-CM | POA: Diagnosis present

## 2019-07-05 DIAGNOSIS — M25652 Stiffness of left hip, not elsewhere classified: Secondary | ICD-10-CM | POA: Insufficient documentation

## 2019-07-05 DIAGNOSIS — M6281 Muscle weakness (generalized): Secondary | ICD-10-CM | POA: Insufficient documentation

## 2019-07-05 NOTE — Therapy (Signed)
[facility] [address] [place], [place], [address] Phone: [phone]   Fax:  [phone]  Physical Therapy Treatment  Patient Details  Name: Rick Maldonado MRN: [id number] Date of Birth: 1985-09-02 Referring Provider (PT): Shirlean Kelly, MD   Encounter Date: 07/05/2019  PT End of Session - 07/05/19 1608    Visit Number  21    Date for PT Re-Evaluation  07/28/19    Authorization Type  [employer]    Authorization - Visit Number  21    Authorization - Number of Visits  60    PT Start Time  1605    PT Stop Time  1658    PT Time Calculation (min)  53 min    Activity Tolerance  Patient tolerated treatment well    Behavior During Therapy  [facility] for tasks assessed/performed       Past Medical History:  Diagnosis Date  . Acid reflux   . Ankle pain   . Buttock pain   . DDD (degenerative disc disease), lumbosacral    bilateral neural foraminal stenosis at L5-S1, mass effect on B exiting L5 nerve roots (2/20)  . Dyslipidemia   . Hip pain   . Knee pain   . Leg pain   . Prediabetes   . Sleep apnea    cpap  . Spondylolysis     Past Surgical History:  Procedure Laterality Date  . ABDOMINAL EXPOSURE N/A 03/13/2019   Procedure: ABDOMINAL EXPOSURE;  Surgeon: Cephus Shelling, MD;  Location: [facility] OR;  Service: Vascular;  Laterality: N/A;  . ANTERIOR LUMBAR FUSION N/A 03/13/2019   Procedure: Lumbar five Sacral one Anterior lumbar interbody fusion with percutaneous posterior instrumentation;  Surgeon: Shirlean Kelly, MD;  Location: [facility] OR;  Service: Neurosurgery;  Laterality: N/A;  . APPLICATION OF ROBOTIC ASSISTANCE FOR SPINAL PROCEDURE N/A 03/13/2019   Procedure: APPLICATION OF ROBOTIC ASSISTANCE FOR SPINAL PROCEDURE;  Surgeon: Shirlean Kelly, MD;  Location: [facility] OR;  Service: Neurosurgery;  Laterality: N/A;  . LUMBAR PERCUTANEOUS PEDICLE SCREW 1 LEVEL N/A 03/13/2019   Procedure: LUMBAR PERCUTANEOUS PEDICLE SCREW LUMBAR  FIVE-SACRAL ONE LEVEL;  Surgeon: Shirlean Kelly, MD;  Location: [facility] OR;  Service: Neurosurgery;  Laterality: N/A;  . TONSILLECTOMY     as a child    There were no vitals filed for this visit.  Subjective Assessment - 07/05/19 1607    Subjective  RT knee was bothering me the other day when I squat.    Pertinent History  lumbar fusion L5-S1    Currently in Pain?  No/denies                       [facility] Adult PT Treatment/Exercise - 07/05/19 0001      Lumbar Exercises: Aerobic   Elliptical  Level 5, ramp 5 x 10  minutes     UBE (Upper Arm Bike)  Level 3.3 x 8 minutes (3/3)      Lumbar Exercises: Standing   Other Standing Lumbar Exercises  Single leg stance Rt and Lt D1 30# 2x15 & D2 pulley 30# 2x15      Knee/Hip Exercises: Stretches   Soleus Stretch  Left;3 reps;20 seconds      Knee/Hip Exercises: Machines for Strengthening   Total Gym Leg Press  Seat 6: Bil 90# 15x, 95# 15x: Single leg Bil 60#2x15       Knee/Hip Exercises: Standing   Walking with Sports Cord  4 ways 30# forward/back, 25#  side ways  15x each    VC to control the eccentric more   Other Standing Knee Exercises  wall squats with ball behind back: 2x10   added ball squeeze              PT Short Term Goals - 06/12/19 1450      PT SHORT TERM GOAL #2   Title  demonstrate Lt hamstring/SLR to > or = to 75 degrees to improve functional mobility        PT Long Term Goals - 06/21/19 1454      PT LONG TERM GOAL #1   Title  be independent in advanced HEP    Time  6    Period  Weeks    Status  On-going    Target Date  07/28/19      PT LONG TERM GOAL #2   Title  reduce FOTO to < or = to 31% limitation    Baseline  45% limitation    Time  6    Period  Weeks    Status  On-going    Target Date  07/28/19      PT LONG TERM GOAL #3   Title  demonstrate 5/5 bil hip strength to improve functional endurance and improve safety with return to spin bike    Baseline  4+/5 Lt hip abduction and  extension    Time  6    Period  Weeks    Status  Revised    Target Date  07/28/19      PT LONG TERM GOAL #4   Title  verbalize and demonstrate body mechanics modifications for lumbar protection with desk work and home tasks    Status  Achieved      PT LONG TERM GOAL #5   Title  report a 90% reduction in Lt LE pain with sitting to improve tolerance for sitting for work and driving.    Baseline  75%    Time  6    Period  Weeks    Status  Revised    Target Date  07/28/19            Plan - 07/05/19 1608    Clinical Impression Statement  Overall pt performs all TE well, very minor verbal cuing for balance: contracting glutes more and more weight through his great toe. Pt was also able to increase slightly the leg press resistance. Some reports of "feeling it" in his back but not pain. pt tolerated more resistance with sport chord walking.    Personal Factors and Comorbidities  Comorbidity 1    Comorbidities  lumbar fusion L5-S1    Examination-Activity Limitations  Dressing;Sit    Examination-Participation Restrictions  Community Activity;Driving    Stability/Clinical Decision Making  Stable/Uncomplicated    Rehab Potential  Good    PT Frequency  2x / week    PT Duration  6 weeks    PT Treatment/Interventions  ADLs/Self Care Home Management;Cryotherapy;Electrical Stimulation;Moist Heat;Functional mobility training;Therapeutic activities;Therapeutic exercise;Neuromuscular re-education;Patient/family education;Manual techniques;Taping;Passive range of motion;Scar mobilization    PT Next Visit Plan  continue core and LE functional strength with emphasis on alignment.    PT Home Exercise Plan  Access Code: [email or web address]    Consulted and Agree with Plan of Care  --       Patient will benefit from skilled therapeutic intervention in order to improve the following deficits and impairments:  Abnormal gait, Decreased activity tolerance, Decreased strength, Impaired flexibility, Postural  dysfunction, Improper  body mechanics, Decreased scar mobility, Decreased range of motion, Increased muscle spasms, Decreased endurance  Visit Diagnosis: Chronic bilateral low back pain, unspecified whether sciatica present  Muscle weakness (generalized)  Stiffness of left hip, not elsewhere classified  Stiffness of right hip, not elsewhere classified     Problem List Patient Active Problem List   Diagnosis Date Noted  . Spondylolisthesis, lumbar region 03/13/2019  . DDD (degenerative disc disease), lumbosacral   . Prediabetes   . Dyslipidemia     Rick Maldonado, PTA 07/05/2019, 4:52 PM  [facility] [address] [place], [place], [address] Phone: [phone]   Fax:  [phone]  Name: Rick Maldonado MRN: [id number] Date of Birth: [birth year]

## 2019-07-12 ENCOUNTER — Other Ambulatory Visit: Payer: Self-pay

## 2019-07-12 ENCOUNTER — Encounter: Payer: Self-pay | Admitting: Physical Therapy

## 2019-07-12 ENCOUNTER — Ambulatory Visit: Payer: 59 | Admitting: Physical Therapy

## 2019-07-12 DIAGNOSIS — M545 Low back pain, unspecified: Secondary | ICD-10-CM

## 2019-07-12 DIAGNOSIS — M25652 Stiffness of left hip, not elsewhere classified: Secondary | ICD-10-CM

## 2019-07-12 DIAGNOSIS — M6281 Muscle weakness (generalized): Secondary | ICD-10-CM

## 2019-07-12 DIAGNOSIS — M25651 Stiffness of right hip, not elsewhere classified: Secondary | ICD-10-CM

## 2019-07-12 DIAGNOSIS — G8929 Other chronic pain: Secondary | ICD-10-CM

## 2019-07-12 NOTE — Therapy (Signed)
[facility] [address], [place], [place], [address] Phone: [phone]   Fax:  [phone]  Physical Therapy Treatment  Patient Details  Name: Rick Maldonado MRN: [id number] Date of Birth: [birth year] Referring Provider (PT): Jovita Gamma, MD   Encounter Date: 07/12/2019  PT End of Session - 07/12/19 1020    Visit Number  22    Date for PT Re-Evaluation  07/28/19    Authorization Type  [employer]    Authorization - Visit Number  19    Authorization - Number of Visits  60    PT Start Time  1017    PT Stop Time  1100    PT Time Calculation (min)  43 min    Activity Tolerance  Patient tolerated treatment well    Behavior During Therapy  [facility] for tasks assessed/performed       Past Medical History:  Diagnosis Date  . Acid reflux   . Ankle pain   . Buttock pain   . DDD (degenerative disc disease), lumbosacral    bilateral neural foraminal stenosis at L5-S1, mass effect on B exiting L5 nerve roots (2/20)  . Dyslipidemia   . Hip pain   . Knee pain   . Leg pain   . Prediabetes   . Sleep apnea    cpap  . Spondylolysis     Past Surgical History:  Procedure Laterality Date  . ABDOMINAL EXPOSURE N/A 03/13/2019   Procedure: ABDOMINAL EXPOSURE;  Surgeon: Marty Heck, MD;  Location: [place];  Service: Vascular;  Laterality: N/A;  . ANTERIOR LUMBAR FUSION N/A 03/13/2019   Procedure: Lumbar five Sacral one Anterior lumbar interbody fusion with percutaneous posterior instrumentation;  Surgeon: Jovita Gamma, MD;  Location: [place];  Service: Neurosurgery;  Laterality: N/A;  . APPLICATION OF ROBOTIC ASSISTANCE FOR SPINAL PROCEDURE N/A 03/13/2019   Procedure: APPLICATION OF ROBOTIC ASSISTANCE FOR SPINAL PROCEDURE;  Surgeon: Jovita Gamma, MD;  Location: [place];  Service: Neurosurgery;  Laterality: N/A;  . LUMBAR PERCUTANEOUS PEDICLE SCREW 1 LEVEL N/A 03/13/2019   Procedure: LUMBAR PERCUTANEOUS PEDICLE SCREW LUMBAR  FIVE-SACRAL ONE LEVEL;  Surgeon: Jovita Gamma, MD;  Location: [place];  Service: Neurosurgery;  Laterality: N/A;  . TONSILLECTOMY     as a child    There were no vitals filed for this visit.  Subjective Assessment - 07/12/19 1021    Subjective  I have been doing more conditioning at home and I think it is making my muscles tighten up a bit. Still experiencing some Rt knee pain coming out of a squat.    Pertinent History  lumbar fusion L5-S1    Currently in Pain?  --   Tightness in back and above the Lt glute   Multiple Pain Sites  No                       [facility] Adult PT Treatment/Exercise - 07/12/19 0001      Lumbar Exercises: Stretches   Other Lumbar Stretch Exercise  Hand to big toe stretch with yoga strap 30 sec each direction      Lumbar Exercises: Aerobic   Elliptical  Level 5, ramp 5 x 10  minutes       Knee/Hip Exercises: Machines for Strengthening   Total Gym Leg Press  Seat 6: 95# x15 100# x15,  Single leg Bil 60# x15 65# 15x       Knee/Hip Exercises: Standing   Walking with  Sports Cord  4 ways 15x 30#       Manual Therapy   Manual Therapy  --    Soft tissue mobilization  --               PT Short Term Goals - 06/12/19 1450      PT SHORT TERM GOAL #2   Title  demonstrate Lt hamstring/SLR to > or = to 75 degrees to improve functional mobility        PT Long Term Goals - 06/21/19 1454      PT LONG TERM GOAL #1   Title  be independent in advanced HEP    Time  6    Period  Weeks    Status  On-going    Target Date  07/28/19      PT LONG TERM GOAL #2   Title  reduce FOTO to < or = to 31% limitation    Baseline  45% limitation    Time  6    Period  Weeks    Status  On-going    Target Date  07/28/19      PT LONG TERM GOAL #3   Title  demonstrate 5/5 bil hip strength to improve functional endurance and improve safety with return to spin bike    Baseline  4+/5 Lt hip abduction and extension    Time  6    Period  Weeks    Status   Revised    Target Date  07/28/19      PT LONG TERM GOAL #4   Title  verbalize and demonstrate body mechanics modifications for lumbar protection with desk work and home tasks    Status  Achieved      PT LONG TERM GOAL #5   Title  report a 90% reduction in Lt LE pain with sitting to improve tolerance for sitting for work and driving.    Baseline  75%    Time  6    Period  Weeks    Status  Revised    Target Date  07/28/19            Plan - 07/12/19 1020    Clinical Impression Statement  Pt reports increasing his "conditioning work" at home which he feels is the reason why his back muscles have tightened up. Pt still experiencing some RT knee pain upon coming out of his squat. Otherwise pt reports he is continuing to progress. Pt's Lt lumbar and QL pretty tight today. PTA instructed pt in the yoga stretch Hand to Big toe to help stretch from his trunk throughout the LE.    Personal Factors and Comorbidities  Comorbidity 1    Comorbidities  lumbar fusion L5-S1    Examination-Activity Limitations  Dressing;Sit    Examination-Participation Restrictions  Community Activity;Driving    Stability/Clinical Decision Making  Stable/Uncomplicated    Rehab Potential  Good    PT Frequency  2x / week    PT Duration  6 weeks    PT Treatment/Interventions  ADLs/Self Care Home Management;Cryotherapy;Electrical Stimulation;Moist Heat;Functional mobility training;Therapeutic activities;Therapeutic exercise;Neuromuscular re-education;Patient/family education;Manual techniques;Taping;Passive range of motion;Scar mobilization    PT Next Visit Plan  continue core and LE functional strength with emphasis on alignment.    PT Home Exercise Plan  Access Code: [email or web address]    Consulted and Agree with Plan of Care  Patient       Patient will benefit from skilled therapeutic intervention in order to improve the following deficits and  impairments:  Abnormal gait, Decreased activity tolerance, Decreased strength,  Impaired flexibility, Postural dysfunction, Improper body mechanics, Decreased scar mobility, Decreased range of motion, Increased muscle spasms, Decreased endurance  Visit Diagnosis: Chronic bilateral low back pain, unspecified whether sciatica present  Muscle weakness (generalized)  Stiffness of left hip, not elsewhere classified  Stiffness of right hip, not elsewhere classified     Problem List Patient Active Problem List   Diagnosis Date Noted  . Spondylolisthesis, lumbar region 03/13/2019  . DDD (degenerative disc disease), lumbosacral   . Prediabetes   . Dyslipidemia     Lavetta Geier, PTA 07/12/2019, 11:02 AM  [facility] [address] [place], [place], [address] Phone: [phone]   Fax:  [phone]  Name: Rick Maldonado MRN: [id number] Date of Birth: March 17, 1985

## 2019-07-18 ENCOUNTER — Other Ambulatory Visit: Payer: Self-pay

## 2019-07-18 ENCOUNTER — Ambulatory Visit: Payer: 59

## 2019-07-18 DIAGNOSIS — M25651 Stiffness of right hip, not elsewhere classified: Secondary | ICD-10-CM

## 2019-07-18 DIAGNOSIS — G8929 Other chronic pain: Secondary | ICD-10-CM

## 2019-07-18 DIAGNOSIS — M25652 Stiffness of left hip, not elsewhere classified: Secondary | ICD-10-CM

## 2019-07-18 DIAGNOSIS — M6281 Muscle weakness (generalized): Secondary | ICD-10-CM

## 2019-07-18 DIAGNOSIS — M545 Low back pain: Secondary | ICD-10-CM | POA: Diagnosis not present

## 2019-07-18 NOTE — Therapy (Signed)
[facility] [address] [place], [place], [address] Phone: [phone]   Fax:  [phone]  Physical Therapy Treatment  Patient Details  Name: Rick Maldonado MRN: [id number] Date of Birth: 01-28-1986 Referring Provider (Rick Maldonado): Shirlean Erilyn Pearman, MD   Encounter Date: 07/18/2019  Rick Maldonado End of Session - 07/18/19 1058    Visit Number  23    Date for Rick Maldonado Re-Evaluation  07/28/19    Authorization Type  [employer]    Authorization - Visit Number  23    Authorization - Number of Visits  60    Rick Maldonado Start Time  1017    Rick Maldonado Stop Time  1058    Rick Maldonado Time Calculation (min)  41 min    Activity Tolerance  Patient tolerated treatment well    Behavior During Therapy  [facility] for tasks assessed/performed       Past Medical History:  Diagnosis Date  . Acid reflux   . Ankle pain   . Buttock pain   . DDD (degenerative disc disease), lumbosacral    bilateral neural foraminal stenosis at L5-S1, mass effect on B exiting L5 nerve roots (2/20)  . Dyslipidemia   . Hip pain   . Knee pain   . Leg pain   . Prediabetes   . Sleep apnea    cpap  . Spondylolysis     Past Surgical History:  Procedure Laterality Date  . ABDOMINAL EXPOSURE N/A 03/13/2019   Procedure: ABDOMINAL EXPOSURE;  Surgeon: Cephus Shelling, MD;  Location: [facility] OR;  Service: Vascular;  Laterality: N/A;  . ANTERIOR LUMBAR FUSION N/A 03/13/2019   Procedure: Lumbar five Sacral one Anterior lumbar interbody fusion with percutaneous posterior instrumentation;  Surgeon: Shirlean Lander Eslick, MD;  Location: [facility] OR;  Service: Neurosurgery;  Laterality: N/A;  . APPLICATION OF ROBOTIC ASSISTANCE FOR SPINAL PROCEDURE N/A 03/13/2019   Procedure: APPLICATION OF ROBOTIC ASSISTANCE FOR SPINAL PROCEDURE;  Surgeon: Shirlean Jasmin Winberry, MD;  Location: [facility] OR;  Service: Neurosurgery;  Laterality: N/A;  . LUMBAR PERCUTANEOUS PEDICLE SCREW 1 LEVEL N/A 03/13/2019   Procedure: LUMBAR PERCUTANEOUS PEDICLE SCREW LUMBAR  FIVE-SACRAL ONE LEVEL;  Surgeon: Shirlean Dashton Czerwinski, MD;  Location: [facility] OR;  Service: Neurosurgery;  Laterality: N/A;  . TONSILLECTOMY     as a child    There were no vitals filed for this visit.  Subjective Assessment - 07/18/19 1021    Subjective  I think that I overdid it.  My Rt knee is hurting and my back was hurting.    Patient Stated Goals  improve strength, flexibility, wean from lumbar brace per MD orders    Currently in Pain?  Yes    Pain Score  3     Pain Location  Back    Pain Orientation  Left;Right    Pain Descriptors / Indicators  Aching;Tightness;Shooting    Pain Type  Surgical pain    Pain Onset  More than a month ago    Pain Frequency  Constant    Aggravating Factors   as the day progresses, squatting, cooking    Pain Relieving Factors  TENs, muscle relaxer                        [facility] Adult Rick Maldonado Treatment/Exercise - 07/18/19 0001      Lumbar Exercises: Stretches   Single Knee to Chest Stretch  2 reps;20 seconds    Piriformis Stretch  Left;Right;3 reps;20 seconds    Other Lumbar Stretch  Exercise  Hand to big toe stretch with yoga strap 30 sec each direction      Lumbar Exercises: Aerobic   UBE (Upper Arm Bike)  Level 3.3 x 5 minutes (3/3)      Lumbar Exercises: Sidelying   Clam  Both;20 reps    Clam Limitations  green band      Knee/Hip Exercises: Stretches   Active Hamstring Stretch  Both;3 reps;20 seconds    Active Hamstring Stretch Limitations  seated    Quad Stretch  Both;2 reps;20 seconds    Quad Stretch Limitations  prone with strap             Rick Maldonado Education - 07/18/19 1045    Education Details  Access Code: [email or web address]    Person(s) Educated  Patient    Methods  Explanation;Demonstration;Handout    Comprehension  Verbalized understanding;Returned demonstration       Rick Maldonado Short Term Goals - 06/12/19 1450      Rick Maldonado SHORT TERM GOAL #2   Title  demonstrate Lt hamstring/SLR to > or = to 75 degrees to improve functional mobility         Rick Maldonado Long Term Goals - 06/21/19 1454      Rick Maldonado LONG TERM GOAL #1   Title  be independent in advanced HEP    Time  6    Period  Weeks    Status  On-going    Target Date  07/28/19      Rick Maldonado LONG TERM GOAL #2   Title  reduce FOTO to < or = to 31% limitation    Baseline  45% limitation    Time  6    Period  Weeks    Status  On-going    Target Date  07/28/19      Rick Maldonado LONG TERM GOAL #3   Title  demonstrate 5/5 bil hip strength to improve functional endurance and improve safety with return to spin bike    Baseline  4+/5 Lt hip abduction and extension    Time  6    Period  Weeks    Status  Revised    Target Date  07/28/19      Rick Maldonado LONG TERM GOAL #4   Title  verbalize and demonstrate body mechanics modifications for lumbar protection with desk work and home tasks    Status  Achieved      Rick Maldonado LONG TERM GOAL #5   Title  report a 90% reduction in Lt LE pain with sitting to improve tolerance for sitting for work and driving.    Baseline  75%    Time  6    Period  Weeks    Status  Revised    Target Date  07/28/19            Plan - 07/18/19 1046    Clinical Impression Statement  Rick Maldonado arrived today with increased stiffness and pain in the Rt LE and low back.  Session focused on addition of regular flexibility exercises to be performed during the day.  Exercises were lower level today to manage current symptoms.  Rick Maldonado reviewed all HEP and discussed how to build a program at home.  Rick Maldonado will continue to benefit from skilled Rick Maldonado to address core and hip strength, endurance/stability and flexibility s/p lumbar fusion.    Rick Maldonado Frequency  2x / week    Rick Maldonado Duration  6 weeks    Rick Maldonado Treatment/Interventions  ADLs/Self Care Home Management;Cryotherapy;Electrical Stimulation;Moist Heat;Functional mobility training;Therapeutic activities;Therapeutic exercise;Neuromuscular  re-education;Patient/family education;Manual techniques;Taping;Passive range of motion;Scar mobilization    Rick Maldonado Next Visit Plan  continue  core and LE functional strength with emphasis on alignment.    Rick Maldonado Home Exercise Plan  Access Code: [email or web address]    Consulted and Agree with Plan of Care  Patient       Patient will benefit from skilled therapeutic intervention in order to improve the following deficits and impairments:  Abnormal gait, Decreased activity tolerance, Decreased strength, Impaired flexibility, Postural dysfunction, Improper body mechanics, Decreased scar mobility, Decreased range of motion, Increased muscle spasms, Decreased endurance  Visit Diagnosis: Chronic bilateral low back pain, unspecified whether sciatica present  Muscle weakness (generalized)  Stiffness of left hip, not elsewhere classified  Stiffness of right hip, not elsewhere classified     Problem List Patient Active Problem List   Diagnosis Date Noted  . Spondylolisthesis, lumbar region 03/13/2019  . DDD (degenerative disc disease), lumbosacral   . Prediabetes   . Dyslipidemia      Rick Maldonado, Rick Maldonado 07/18/19 11:00 AM  [facility] [address] [place], [place], [address] Phone: [phone]   Fax:  [phone]  Name: Rick Maldonado MRN: [id number] Date of Birth: 05/10/1985

## 2019-07-18 NOTE — Patient Instructions (Signed)
Access Code: [email or web address] URL: [email or web address] Date: 05/01/2019 Prepared by: Tresa Endo  Exercises   Seated Hamstring Stretch - 2 x daily - 7 x weekly - 1 sets - 3 reps - 20 hold Seated Figure 4 Piriformis Stretch - 2 x daily - 7 x weekly - 1 sets - 3 reps - 20 hold Supine Single Knee to Chest Stretch - 2 x daily - 7 x weekly - 1 sets - 3 reps - 20 hold

## 2019-07-25 ENCOUNTER — Ambulatory Visit: Payer: 59

## 2019-07-25 ENCOUNTER — Other Ambulatory Visit: Payer: Self-pay

## 2019-07-25 DIAGNOSIS — M25651 Stiffness of right hip, not elsewhere classified: Secondary | ICD-10-CM

## 2019-07-25 DIAGNOSIS — M545 Low back pain, unspecified: Secondary | ICD-10-CM

## 2019-07-25 DIAGNOSIS — M6281 Muscle weakness (generalized): Secondary | ICD-10-CM

## 2019-07-25 DIAGNOSIS — G8929 Other chronic pain: Secondary | ICD-10-CM

## 2019-07-25 DIAGNOSIS — M25652 Stiffness of left hip, not elsewhere classified: Secondary | ICD-10-CM

## 2019-07-25 NOTE — Therapy (Signed)
[facility] [address] [place], [place], [address] Phone: [phone]   Fax:  [phone]  Physical Therapy Treatment  Patient Details  Name: Rick Maldonado MRN: [id number] Date of Birth: June 20, 1985 Referring Provider (PT): Jovita Gamma, MD   Encounter Date: 07/25/2019  PT End of Session - 07/25/19 1317    Visit Number  8    Authorization - Visit Number  24    Authorization - Number of Visits  60    PT Start Time  2694    PT Stop Time  1313    PT Time Calculation (min)  42 min    Activity Tolerance  Patient tolerated treatment well    Behavior During Therapy  [facility] for tasks assessed/performed       Past Medical History:  Diagnosis Date  . Acid reflux   . Ankle pain   . Buttock pain   . DDD (degenerative disc disease), lumbosacral    bilateral neural foraminal stenosis at L5-S1, mass effect on B exiting L5 nerve roots (2/20)  . Dyslipidemia   . Hip pain   . Knee pain   . Leg pain   . Prediabetes   . Sleep apnea    cpap  . Spondylolysis     Past Surgical History:  Procedure Laterality Date  . ABDOMINAL EXPOSURE N/A 03/13/2019   Procedure: ABDOMINAL EXPOSURE;  Surgeon: Marty Heck, MD;  Location: [place];  Service: Vascular;  Laterality: N/A;  . ANTERIOR LUMBAR FUSION N/A 03/13/2019   Procedure: Lumbar five Sacral one Anterior lumbar interbody fusion with percutaneous posterior instrumentation;  Surgeon: Jovita Gamma, MD;  Location: [place];  Service: Neurosurgery;  Laterality: N/A;  . APPLICATION OF ROBOTIC ASSISTANCE FOR SPINAL PROCEDURE N/A 03/13/2019   Procedure: APPLICATION OF ROBOTIC ASSISTANCE FOR SPINAL PROCEDURE;  Surgeon: Jovita Gamma, MD;  Location: [place];  Service: Neurosurgery;  Laterality: N/A;  . LUMBAR PERCUTANEOUS PEDICLE SCREW 1 LEVEL N/A 03/13/2019   Procedure: LUMBAR PERCUTANEOUS PEDICLE SCREW LUMBAR FIVE-SACRAL ONE LEVEL;  Surgeon: Jovita Gamma, MD;  Location: [place];  Service: Neurosurgery;  Laterality: N/A;  . TONSILLECTOMY     as a child    There were no vitals filed for this visit.  Subjective Assessment - 07/25/19 1234    Subjective  I was pretty sick last week with a stomach bug.    Currently in Pain?  Yes         [facility] PT Assessment - 07/25/19 0001      Assessment   Medical Diagnosis  s/p lumbar fusion (L5-S1)    Referring Provider (PT)  Jovita Gamma, MD    Onset Date/Surgical Date  03/13/19      Cognition   Overall Cognitive Status  Within Functional Limits for tasks assessed      Observation/Other Assessments   Focus on Therapeutic Outcomes (FOTO)   37% limitation      PROM   Overall PROM Comments  Lt hamstring 70 degrees      Strength   Left Hip Flexion  5/5    Left Hip Extension  4+/5    Left Hip ABduction  5/5                    [facility] Adult PT Treatment/Exercise - 07/25/19 0001      Lumbar Exercises: Aerobic   UBE (Upper Arm Bike)  Level 3.3 x 6 minutes (3/3)      Lumbar Exercises: Standing  Other Standing Lumbar Exercises  Single leg stance Rt and Lt D1 30# 2x15 & D2 pulley 30# 2x15      Knee/Hip Exercises: Standing   Walking with Sports Cord  4 ways 15x 30#                PT Short Term Goals - 06/12/19 1450      PT SHORT TERM GOAL #2   Title  demonstrate Lt hamstring/SLR to > or = to 75 degrees to improve functional mobility        PT Long Term Goals - 07/25/19 1239      PT LONG TERM GOAL #1   Title  be independent in advanced HEP    Status  Achieved      PT LONG TERM GOAL #2   Title  reduce FOTO to < or = to 31% limitation      PT LONG TERM GOAL #4   Title  verbalize and demonstrate body mechanics modifications for lumbar protection with desk work and home tasks    Status  Achieved      PT LONG TERM GOAL #5   Title  report a 90% reduction in Lt LE pain with sitting to improve tolerance for sitting for work and driving.    Baseline  no pain.  Some Rt LE pain after  squatting    Status  Achieved            Plan - 07/25/19 1300    Clinical Impression Statement  Pt is ready for D/C to HEP.  Session spent discussing how to progress exercise and safely return to prior level of exercise.  Pt with mild lumbar and Rt knee pain with increased activity.  Pt with improved Lt hip strength throughout.  FOTO is improved to 37% limitation.  Pt will D/C to HEP and follow-up with MD as needed.    PT Next Visit Plan  D/C PT to HEP    PT Home Exercise Plan  Access Code: [email or web address]    Consulted and Agree with Plan of Care  Patient       Patient will benefit from skilled therapeutic intervention in order to improve the following deficits and impairments:     Visit Diagnosis: Chronic bilateral low back pain, unspecified whether sciatica present  Muscle weakness (generalized)  Stiffness of left hip, not elsewhere classified  Stiffness of right hip, not elsewhere classified     Problem List Patient Active Problem List   Diagnosis Date Noted  . Spondylolisthesis, lumbar region 03/13/2019  . DDD (degenerative disc disease), lumbosacral   . Prediabetes   . Dyslipidemia     PHYSICAL THERAPY DISCHARGE SUMMARY  Visits from Start of Care: 24  Current functional level related to goals / functional outcomes: See above for current status   Remaining deficits: Lumbar and Rt LE pain with increased activity.     Education / Equipment: HEP, posture/body mechanics Plan: Patient agrees to discharge.  Patient goals were partially met. Patient is being discharged due to being pleased with the current functional level.  ?????        elly Naturi Alarid, PT 07/25/19 1:19 PM  [facility] [address], [place], [place], [address] Phone: [phone]   Fax:  [phone]  Name: Rick Maldonado MRN: [id number] Date of Birth: 23-Sep-1985

## 2019-11-01 ENCOUNTER — Other Ambulatory Visit: Payer: Self-pay

## 2019-11-01 ENCOUNTER — Other Ambulatory Visit: Payer: 59

## 2019-11-01 DIAGNOSIS — E785 Hyperlipidemia, unspecified: Secondary | ICD-10-CM

## 2019-11-01 DIAGNOSIS — Z0000 Encounter for general adult medical examination without abnormal findings: Secondary | ICD-10-CM

## 2019-11-01 DIAGNOSIS — R7303 Prediabetes: Secondary | ICD-10-CM

## 2019-11-02 LAB — COMPLETE METABOLIC PANEL WITH GFR
AG Ratio: 2.4 (calc) (ref 1.0–2.5)
ALT: 19 U/L (ref 9–46)
AST: 13 U/L (ref 10–40)
Albumin: 4.5 g/dL (ref 3.6–5.1)
Alkaline phosphatase (APISO): 53 U/L (ref 36–130)
BUN/Creatinine Ratio: 14 (calc) (ref 6–22)
BUN: 19 mg/dL (ref 7–25)
CO2: 30 mmol/L (ref 20–32)
Calcium: 9.4 mg/dL (ref 8.6–10.3)
Chloride: 104 mmol/L (ref 98–110)
Creat: 1.39 mg/dL — ABNORMAL HIGH (ref 0.60–1.35)
GFR, Est African American: 76 mL/min/{1.73_m2} (ref >=60)
GFR, Est Non African American: 65 mL/min/{1.73_m2} (ref >=60)
Globulin: 1.9 g/dL (calc) (ref 1.9–3.7)
Glucose, Bld: 105 mg/dL — ABNORMAL HIGH (ref 65–99)
Potassium: 4.8 mmol/L (ref 3.5–5.3)
Sodium: 139 mmol/L (ref 135–146)
Total Bilirubin: 1.1 mg/dL (ref 0.2–1.2)
Total Protein: 6.4 g/dL (ref 6.1–8.1)

## 2019-11-02 LAB — HEMOGLOBIN A1C
Hgb A1c MFr Bld: 5 % of total Hgb (ref <5.7)
Mean Plasma Glucose: 97 (calc)
eAG (mmol/L): 5.4 (calc)

## 2019-11-02 LAB — CBC WITH DIFFERENTIAL/PLATELET
Absolute Monocytes: 614 cells/uL (ref 200–950)
Basophils Absolute: 40 cells/uL (ref 0–200)
Basophils Relative: 0.6 %
Eosinophils Absolute: 132 cells/uL (ref 15–500)
Eosinophils Relative: 2 %
HCT: 45.9 % (ref 38.5–50.0)
Hemoglobin: 15.4 g/dL (ref 13.2–17.1)
Lymphs Abs: 1914 cells/uL (ref 850–3900)
MCH: 31.3 pg (ref 27.0–33.0)
MCHC: 33.6 g/dL (ref 32.0–36.0)
MCV: 93.3 fL (ref 80.0–100.0)
MPV: 10.5 fL (ref 7.5–12.5)
Monocytes Relative: 9.3 %
Neutro Abs: 3901 cells/uL (ref 1500–7800)
Neutrophils Relative %: 59.1 %
Platelets: 177 10*3/uL (ref 140–400)
RBC: 4.92 10*6/uL (ref 4.20–5.80)
RDW: 12.2 % (ref 11.0–15.0)
Total Lymphocyte: 29 %
WBC: 6.6 10*3/uL (ref 3.8–10.8)

## 2019-11-02 LAB — LIPID PANEL
Cholesterol: 157 mg/dL (ref <200)
HDL: 33 mg/dL — ABNORMAL LOW (ref >=40)
LDL Cholesterol (Calc): 99 mg/dL (calc)
Non-HDL Cholesterol (Calc): 124 mg/dL (calc) (ref <130)
Total CHOL/HDL Ratio: 4.8 (calc) (ref <5.0)
Triglycerides: 149 mg/dL (ref <150)

## 2019-11-07 ENCOUNTER — Other Ambulatory Visit: Payer: Self-pay

## 2019-11-07 ENCOUNTER — Encounter: Payer: Self-pay | Admitting: Family Medicine

## 2019-11-07 ENCOUNTER — Ambulatory Visit (INDEPENDENT_AMBULATORY_CARE_PROVIDER_SITE_OTHER): Payer: 59 | Admitting: Family Medicine

## 2019-11-07 VITALS — BP 118/80 | HR 97 | Temp 96.5°F | Ht 67.0 in | Wt 193.0 lb

## 2019-11-07 DIAGNOSIS — Z0001 Encounter for general adult medical examination with abnormal findings: Secondary | ICD-10-CM

## 2019-11-07 DIAGNOSIS — E785 Hyperlipidemia, unspecified: Secondary | ICD-10-CM | POA: Diagnosis not present

## 2019-11-07 DIAGNOSIS — Z0000 Encounter for general adult medical examination without abnormal findings: Secondary | ICD-10-CM

## 2019-11-07 NOTE — Progress Notes (Signed)
Subjective:    Patient ID: Rick Maldonado, male    DOB: 11-06-1985, 34 y.o.   MRN: [id number]  HPI  Patient is here today for complete physical exam.  Patient is already had his Covid vaccine.  He has not had his flu shot.  I recommended the flu shot.  He politely declines the flu shot.  He is not yet due for any cancer screening.  He underwent surgery on his back in January of this year.  He is doing much better since then.  He still occasionally has pain in his back and also pain in his left ankle for which he takes meloxicam.  His most recent lab work is listed below and is significant for elevations in his renal function.  He also has chronic known dyslipidemia. Lab on 11/01/2019  Component Date Value Ref Range Status  . WBC 11/01/2019 6.6  3.8 - 10.8 Thousand/uL Final  . RBC 11/01/2019 4.92  4.20 - 5.80 Million/uL Final  . Hemoglobin 11/01/2019 15.4  13.2 - 17.1 g/dL Final  . HCT 16/60/6301 45.9  38 - 50 % Final  . MCV 11/01/2019 93.3  80.0 - 100.0 fL Final  . MCH 11/01/2019 31.3  27.0 - 33.0 pg Final  . MCHC 11/01/2019 33.6  32.0 - 36.0 g/dL Final  . RDW 60/11/9321 12.2  11.0 - 15.0 % Final  . Platelets 11/01/2019 177  140 - 400 Thousand/uL Final  . MPV 11/01/2019 10.5  7.5 - 12.5 fL Final  . Neutro Abs 11/01/2019 3,901  1,500 - 7,800 cells/uL Final  . Lymphs Abs 11/01/2019 1,914  850 - 3,900 cells/uL Final  . Absolute Monocytes 11/01/2019 614  200 - 950 cells/uL Final  . Eosinophils Absolute 11/01/2019 132  15 - 500 cells/uL Final  . Basophils Absolute 11/01/2019 40  0 - 200 cells/uL Final  . Neutrophils Relative % 11/01/2019 59.1  % Final  . Total Lymphocyte 11/01/2019 29.0  % Final  . Monocytes Relative 11/01/2019 9.3  % Final  . Eosinophils Relative 11/01/2019 2.0  % Final  . Basophils Relative 11/01/2019 0.6  % Final  . Cholesterol 11/01/2019 157  <200 mg/dL Final  . HDL 55/73/2202 33* > OR = 40 mg/dL Final  . Triglycerides 11/01/2019 149  <150 mg/dL Final  . LDL  Cholesterol (Calc) 11/01/2019 99  mg/dL (calc) Final   Comment: Reference range: <100 . Desirable range <100 mg/dL for primary prevention;   <70 mg/dL for patients with CHD or diabetic patients  with > or = 2 CHD risk factors. Marland Kitchen LDL-C is now calculated using the Martin-Hopkins  calculation, which is a validated novel method providing  better accuracy than the Friedewald equation in the  estimation of LDL-C.  Horald Pollen et al. Lenox Ahr. 5427;062(37): 2061-2068  (http://education.[facility].com/faq/FAQ164)   . Total CHOL/HDL Ratio 11/01/2019 4.8  <6.2 (calc) Final  . Non-HDL Cholesterol (Calc) 11/01/2019 124  <130 mg/dL (calc) Final   Comment: For patients with diabetes plus 1 major ASCVD risk  factor, treating to a non-HDL-C goal of <100 mg/dL  (LDL-C of <83 mg/dL) is considered a therapeutic  option.   . Glucose, Bld 11/01/2019 105* 65 - 99 mg/dL Final   Comment: .            Fasting reference interval . For someone without known diabetes, a glucose value between 100 and 125 mg/dL is consistent with prediabetes and should be confirmed with a follow-up test. .   . BUN 11/01/2019 19  7 - 25 mg/dL Final  . Creat 69/48/5462 1.39* 0.60 - 1.35 mg/dL Final  . GFR, Est Non African American 11/01/2019 65  > OR = 60 mL/min/1.52m2 Final  . GFR, Est African American 11/01/2019 76  > OR = 60 mL/min/1.63m2 Final  . BUN/Creatinine Ratio 11/01/2019 14  6 - 22 (calc) Final  . Sodium 11/01/2019 139  135 - 146 mmol/L Final  . Potassium 11/01/2019 4.8  3.5 - 5.3 mmol/L Final  . Chloride 11/01/2019 104  98 - 110 mmol/L Final  . CO2 11/01/2019 30  20 - 32 mmol/L Final  . Calcium 11/01/2019 9.4  8.6 - 10.3 mg/dL Final  . Total Protein 11/01/2019 6.4  6.1 - 8.1 g/dL Final  . Albumin 70/35/0093 4.5  3.6 - 5.1 g/dL Final  . Globulin 81/82/9937 1.9  1.9 - 3.7 g/dL (calc) Final  . AG Ratio 11/01/2019 2.4  1.0 - 2.5 (calc) Final  . Total Bilirubin 11/01/2019 1.1  0.2 - 1.2 mg/dL Final  . Alkaline  phosphatase (APISO) 11/01/2019 53  36 - 130 U/L Final  . AST 11/01/2019 13  10 - 40 U/L Final  . ALT 11/01/2019 19  9 - 46 U/L Final  . Hgb A1c MFr Bld 11/01/2019 5.0  <5.7 % of total Hgb Final   Comment: For the purpose of screening for the presence of diabetes: . <5.7%       Consistent with the absence of diabetes 5.7-6.4%    Consistent with increased risk for diabetes             (prediabetes) > or =6.5%  Consistent with diabetes . This assay result is consistent with a decreased risk of diabetes. . Currently, no consensus exists regarding use of hemoglobin A1c for diagnosis of diabetes in children. . According to American Diabetes Association (ADA) guidelines, hemoglobin A1c <7.0% represents optimal control in non-pregnant diabetic patients. Different metrics may apply to specific patient populations.  Standards of Medical Care in Diabetes(ADA). .   . Mean Plasma Glucose 11/01/2019 97  (calc) Final  . eAG (mmol/L) 11/01/2019 5.4  (calc) Final    Past Medical History:  Diagnosis Date  . Acid reflux   . Ankle pain   . Buttock pain   . DDD (degenerative disc disease), lumbosacral    bilateral neural foraminal stenosis at L5-S1, mass effect on B exiting L5 nerve roots (2/20)  . Dyslipidemia   . Hip pain   . Knee pain   . Leg pain   . Prediabetes   . Sleep apnea    cpap  . Spondylolysis    Past Surgical History:  Procedure Laterality Date  . ABDOMINAL EXPOSURE N/A 03/13/2019   Procedure: ABDOMINAL EXPOSURE;  Surgeon: Cephus Shelling, MD;  Location: [facility] OR;  Service: Vascular;  Laterality: N/A;  . ANTERIOR LUMBAR FUSION N/A 03/13/2019   Procedure: Lumbar five Sacral one Anterior lumbar interbody fusion with percutaneous posterior instrumentation;  Surgeon: Shirlean Kelly, MD;  Location: [facility] OR;  Service: Neurosurgery;  Laterality: N/A;  . APPLICATION OF ROBOTIC ASSISTANCE FOR SPINAL PROCEDURE N/A 03/13/2019   Procedure: APPLICATION OF ROBOTIC ASSISTANCE FOR SPINAL  PROCEDURE;  Surgeon: Shirlean Kelly, MD;  Location[facility] OR;  Service: Neurosurgery;  Laterality: N/A;  . LUMBAR PERCUTANEOUS PEDICLE SCREW 1 LEVEL N/A 03/13/2019   Procedure: LUMBAR PERCUTANEOUS PEDICLE SCREW LUMBAR FIVE-SACRAL ONE LEVEL;  Surgeon: Shirlean Kelly, MD;  Location: [facility] OR;  Service: Neurosurgery;  Laterality: N/A;  . TONSILLECTOMY     as  a child   Current Outpatient Medications on File Prior to Visit  Medication Sig Dispense Refill  . Biotin w/ Vitamins C & E (HAIR/SKIN/NAILS PO) Take 1 tablet by mouth daily.    . cyclobenzaprine (FLEXERIL) 10 MG tablet Take 0.5-1 tablets (5-10 mg total) by mouth 3 (three) times daily as needed for muscle spasms. (Patient not taking: Reported on 04/06/2019) 90 tablet 0  . gabapentin (NEURONTIN) 300 MG capsule Take 1 capsule (300 mg total) by mouth 3 (three) times daily. As needed for nerve pain (Patient not taking: Reported on 04/06/2019) 90 capsule 3  . HYDROcodone-acetaminophen (NORCO/VICODIN) 5-325 MG tablet Take 1-2 tablets by mouth every 4 (four) hours as needed (pain). (Patient not taking: Reported on 04/06/2019) 50 tablet 0  . ibuprofen (ADVIL) 200 MG tablet Take 400-600 mg by mouth every 8 (eight) hours as needed (pain.).    Marland Kitchen UNABLE TO FIND CPAP for OSA     No current facility-administered medications on file prior to visit.   Allergies  Allergen Reactions  . Morphine And Related Anaphylaxis    03/13/19 Per pt, he was told that he had throat closing to morphine when his tonsils were removed as a child. Has tolerated oxycodone and hydromorphone for various procedures as an adult   . Codeine Other (See Comments)    Reaction unknown  . Penicillins Other (See Comments)    Reaction unknown Did it involve swelling of the face/tongue/throat, SOB, or low BP? Unknown Did it involve sudden or severe rash/hives, skin peeling, or any reaction on the inside of your mouth or nose? Unknown Did you need to seek medical attention at a hospital or doctor's  office? Unknown When did it last happen?Informed as a child that he was allergic If all above answers are "NO", may proceed with cephalosporin use.   . Sulfa Antibiotics Other (See Comments)    Reaction unknown   Social History   Socioeconomic History  . Marital status: Married    Spouse name: [name]  . Number of children: 0  . Years of education: BS  . Highest education level: Not on file  Occupational History  . Occupation: Nature conservation officer    Comment: [employer]  Tobacco Use  . Smoking status: Never Smoker  . Smokeless tobacco: Never Used  Substance and Sexual Activity  . Alcohol use: Yes    Comment: rarely  . Drug use: No  . Sexual activity: Not on file  Other Topics Concern  . Not on file  Social History Narrative   Patient consumes 2 glasses of caffeine daily,is right handed   Social Determinants of Health   Financial Resource Strain:   . Difficulty of Paying Living Expenses: Not on file  Food Insecurity:   . Worried About Programme researcher, broadcasting/film/video in the Last Year: Not on file  . Ran Out of Food in the Last Year: Not on file  Transportation Needs:   . Lack of Transportation (Medical): Not on file  . Lack of Transportation (Non-Medical): Not on file  Physical Activity:   . Days of Exercise per Week: Not on file  . Minutes of Exercise per Session: Not on file  Stress:   . Feeling of Stress : Not on file  Social Connections:   . Frequency of Communication with Friends and Family: Not on file  . Frequency of Social Gatherings with Friends and Family: Not on file  . Attends Religious Services: Not on file  . Active Member of Clubs or  Organizations: Not on file  . Attends BankerClub or Organization Meetings: Not on file  . Marital Status: Not on file  Intimate Partner Violence:   . Fear of Current or Ex-Partner: Not on file  . Emotionally Abused: Not on file  . Physically Abused: Not on file  . Sexually Abused: Not on file     Review of Systems     Objective:    Physical Exam Vitals reviewed.  Constitutional:      General: He is not in acute distress.    Appearance: Normal appearance. He is well-developed and normal weight. He is not ill-appearing, toxic-appearing or diaphoretic.  HENT:     Head: Normocephalic and atraumatic.     Right Ear: Tympanic membrane and ear canal normal. There is no impacted cerumen.     Left Ear: Tympanic membrane and ear canal normal. There is no impacted cerumen.     Nose: Nose normal. No congestion or rhinorrhea.     Mouth/Throat:     Mouth: Mucous membranes are moist.     Pharynx: No oropharyngeal exudate or posterior oropharyngeal erythema.  Eyes:     General: No scleral icterus.       Right eye: No discharge.        Left eye: No discharge.     Extraocular Movements: Extraocular movements intact.     Conjunctiva/sclera: Conjunctivae normal.     Pupils: Pupils are equal, round, and reactive to light.  Neck:     Vascular: No carotid bruit.  Cardiovascular:     Rate and Rhythm: Normal rate and regular rhythm.     Pulses: Normal pulses.     Heart sounds: Normal heart sounds. No murmur heard.  No friction rub. No gallop.   Pulmonary:     Effort: Pulmonary effort is normal. No respiratory distress.     Breath sounds: Normal breath sounds. No stridor. No wheezing, rhonchi or rales.  Chest:     Chest wall: No tenderness.  Abdominal:     General: Abdomen is flat. Bowel sounds are normal. There is no distension.     Palpations: Abdomen is soft. There is no mass.     Tenderness: There is no abdominal tenderness. There is no guarding or rebound.     Hernia: No hernia is present.  Musculoskeletal:     Cervical back: Normal range of motion and neck supple. No muscular tenderness.     Right lower leg: No edema.     Left lower leg: No edema.  Lymphadenopathy:     Cervical: No cervical adenopathy.  Skin:    General: Skin is warm.     Coloration: Skin is not jaundiced or pale.     Findings: No bruising,  erythema, lesion or rash.  Neurological:     General: No focal deficit present.     Mental Status: He is alert and oriented to person, place, and time.     Cranial Nerves: No cranial nerve deficit.     Sensory: No sensory deficit.     Motor: No weakness.     Coordination: Coordination normal.     Gait: Gait normal.     Deep Tendon Reflexes: Reflexes normal.  Psychiatric:        Mood and Affect: Mood normal.        Behavior: Behavior normal.        Thought Content: Thought content normal.        Judgment: Judgment normal.    Assessment & Plan:  Routine general medical examination at a health care facility  Dyslipidemia  Patient's blood sugar is slightly elevated at 105 however his A1c shows no evidence of prediabetes.  His HDL is low at 33.  Record mended fish oil 2000 mg a day.  Also recommended aerobic exercise.  Renal function is slightly worse.  I recommended avoiding NSAIDs and drinking more water and rechecking a BMP in 2 to 3 weeks.  Recommended a flu shot.  Otherwise regular anticipatory guidance is provided

## 2019-11-08 ENCOUNTER — Ambulatory Visit: Payer: 59 | Admitting: Adult Health

## 2019-11-08 ENCOUNTER — Encounter: Payer: Self-pay | Admitting: Adult Health

## 2019-11-08 VITALS — BP 131/83 | HR 62 | Ht 68.0 in | Wt 195.0 lb

## 2019-11-08 DIAGNOSIS — G4733 Obstructive sleep apnea (adult) (pediatric): Secondary | ICD-10-CM | POA: Diagnosis not present

## 2019-11-08 DIAGNOSIS — Z9989 Dependence on other enabling machines and devices: Secondary | ICD-10-CM | POA: Diagnosis not present

## 2019-11-08 NOTE — Progress Notes (Signed)
PATIENT: Rick Maldonado DOB: 1985/10/02  REASON FOR VISIT: follow up HISTORY FROM: patient  HISTORY OF PRESENT ILLNESS: Today 11/08/19:  Rick Maldonado is a 34 year old male with a history of obstructive sleep apnea on CPAP.  His download indicates that he uses machine nightly for compliance of 100%.  He uses machine greater than 4 hours 27 days for compliance of 90%.  On average he uses his machine 8 hours and 19 minutes.  His residual AHI is 1 on 8 cm of water with EPR of 3.  Leak in the 95th percentile is 2.7 L/min.  He reports that the CPAP is working well for him.  States that he can now drive without falling asleep.  HISTORY 11/03/18:  Rick Maldonado is a 34 year old male with a history of obstructive sleep apnea on CPAP.  He returns today for follow-up.  His download indicates that he use his machine 18 out of 30 days for compliance of 60%.  He uses machine greater than 4 hours 15 days for compliance of 50%.  On average he uses his machine 6 hours and 19 minutes.  His residual AHI is 1 on 8 cm of water with EPR 3.  He does not have a significant leak.  He states that there are some nights he is unable to use the machine because he is having back pain.  He has had one spinal injection and is getting another today.  He states that since he changed his mask he has been able to use it more often.  REVIEW OF SYSTEMS: Out of a complete 14 system review of symptoms, the patient complains only of the following symptoms, and all other reviewed systems are negative.  FSS 39 ESS 5  ALLERGIES: Allergies  Allergen Reactions  . Morphine And Related Anaphylaxis    03/13/19 Per pt, he was told that he had throat closing to morphine when his tonsils were removed as a child. Has tolerated oxycodone and hydromorphone for various procedures as an adult   . Codeine Other (See Comments)    Reaction unknown  . Penicillins Other (See Comments)    Reaction unknown Did it involve swelling of the  face/tongue/throat, SOB, or low BP? Unknown Did it involve sudden or severe rash/hives, skin peeling, or any reaction on the inside of your mouth or nose? Unknown Did you need to seek medical attention at a hospital or doctor's office? Unknown When did it last happen?Informed as a child that he was allergic If all above answers are "NO", may proceed with cephalosporin use.   . Sulfa Antibiotics Other (See Comments)    Reaction unknown    HOME MEDICATIONS: Outpatient Medications Prior to Visit  Medication Sig Dispense Refill  . meloxicam (MOBIC) 7.5 MG tablet Take 7.5 mg by mouth 2 (two) times daily.    Marland Kitchen UNABLE TO FIND CPAP for OSA     No facility-administered medications prior to visit.    PAST MEDICAL HISTORY: Past Medical History:  Diagnosis Date  . Acid reflux   . DDD (degenerative disc disease), lumbosacral    bilateral neural foraminal stenosis at L5-S1, mass effect on B exiting L5 nerve roots (2/20)  . Dyslipidemia   . Sleep apnea    cpap  . Spondylolysis     PAST SURGICAL HISTORY: Past Surgical History:  Procedure Laterality Date  . ABDOMINAL EXPOSURE N/A 03/13/2019   Procedure: ABDOMINAL EXPOSURE;  Surgeon: Cephus Shelling, MD;  Location: [facility] OR;  Service: Vascular;  Laterality: N/A;  . ANTERIOR LUMBAR FUSION N/A 03/13/2019   Procedure: Lumbar five Sacral one Anterior lumbar interbody fusion with percutaneous posterior instrumentation;  Surgeon: Shirlean Kelly, MD;  Location: [facility] OR;  Service: Neurosurgery;  Laterality: N/A;  . APPLICATION OF ROBOTIC ASSISTANCE FOR SPINAL PROCEDURE N/A 03/13/2019   Procedure: APPLICATION OF ROBOTIC ASSISTANCE FOR SPINAL PROCEDURE;  Surgeon: Shirlean Kelly, MD;  Location: [facility] OR;  Service: Neurosurgery;  Laterality: N/A;  . LUMBAR PERCUTANEOUS PEDICLE SCREW 1 LEVEL N/A 03/13/2019   Procedure: LUMBAR PERCUTANEOUS PEDICLE SCREW LUMBAR FIVE-SACRAL ONE LEVEL;  Surgeon: Shirlean Kelly, MD;  Location: [facility] OR;  Service: Neurosurgery;   Laterality: N/A;  . TONSILLECTOMY     as a child    FAMILY HISTORY: Family History  Problem Relation Age of Onset  . Other Mother        drug addiction  . Arthritis Father     SOCIAL HISTORY: Social History   Socioeconomic History  . Marital status: Married    Spouse name: [name]  . Number of children: 0  . Years of education: BS  . Highest education level: Not on file  Occupational History  . Occupation: Nature conservation officer    Comment: [employer]  Tobacco Use  . Smoking status: Never Smoker  . Smokeless tobacco: Never Used  Substance and Sexual Activity  . Alcohol use: Yes    Comment: rarely  . Drug use: No  . Sexual activity: Not on file  Other Topics Concern  . Not on file  Social History Narrative   Patient consumes 2 glasses of caffeine daily,is right handed   Social Determinants of Health   Financial Resource Strain:   . Difficulty of Paying Living Expenses: Not on file  Food Insecurity:   . Worried About Programme researcher, broadcasting/film/video in the Last Year: Not on file  . Ran Out of Food in the Last Year: Not on file  Transportation Needs:   . Lack of Transportation (Medical): Not on file  . Lack of Transportation (Non-Medical): Not on file  Physical Activity:   . Days of Exercise per Week: Not on file  . Minutes of Exercise per Session: Not on file  Stress:   . Feeling of Stress : Not on file  Social Connections:   . Frequency of Communication with Friends and Family: Not on file  . Frequency of Social Gatherings with Friends and Family: Not on file  . Attends Religious Services: Not on file  . Active Member of Clubs or Organizations: Not on file  . Attends Banker Meetings: Not on file  . Marital Status: Not on file  Intimate Partner Violence:   . Fear of Current or Ex-Partner: Not on file  . Emotionally Abused: Not on file  . Physically Abused: Not on file  . Sexually Abused: Not on file      PHYSICAL EXAM  Vitals:   11/08/19 1433  BP:  131/83  Pulse: 62  Weight: 195 lb (88.5 kg)  Height: 5\' 8"  (1.727 m)   Body mass index is 29.65 kg/m.  Generalized: Well developed, in no acute distress  Chest: Lungs clear to auscultation bilaterally  Neurological examination  Mentation: Alert oriented to time, place, history taking. Follows all commands speech and language fluent Cranial nerve II-XII: Extraocular movements were full, visual field were full on confrontational test Head turning and shoulder shrug  were normal and symmetric. Motor: The motor testing reveals 5 over 5 strength of all 4 extremities. Good  symmetric motor tone is noted throughout.  Sensory: Sensory testing is intact to soft touch on all 4 extremities. No evidence of extinction is noted.  Gait and station: Gait is normal.    DIAGNOSTIC DATA (LABS, IMAGING, TESTING) - I reviewed patient records, labs, notes, testing and imaging myself where available.  Lab Results  Component Value Date   WBC 6.6 11/01/2019   HGB 15.4 11/01/2019   HCT 45.9 11/01/2019   MCV 93.3 11/01/2019   PLT 177 11/01/2019      Component Value Date/Time   NA 139 11/01/2019 0815   K 4.8 11/01/2019 0815   CL 104 11/01/2019 0815   CO2 30 11/01/2019 0815   GLUCOSE 105 (H) 11/01/2019 0815   BUN 19 11/01/2019 0815   CREATININE 1.39 (H) 11/01/2019 0815   CALCIUM 9.4 11/01/2019 0815   PROT 6.4 11/01/2019 0815   ALBUMIN 4.3 01/07/2016 0825   AST 13 11/01/2019 0815   ALT 19 11/01/2019 0815   ALKPHOS 43 01/07/2016 0825   BILITOT 1.1 11/01/2019 0815   GFRNONAA 65 11/01/2019 0815   GFRAA 76 11/01/2019 0815   Lab Results  Component Value Date   CHOL 157 11/01/2019   HDL 33 (L) 11/01/2019   LDLCALC 99 11/01/2019   TRIG 149 11/01/2019   CHOLHDL 4.8 11/01/2019   Lab Results  Component Value Date   HGBA1C 5.0 11/01/2019   No results found for: VITAMINB12 No results found for: TSH    ASSESSMENT AND PLAN 34 y.o. year old male  has a past medical history of Acid reflux, DDD  (degenerative disc disease), lumbosacral, Dyslipidemia, Sleep apnea, and Spondylolysis. here with:  1. OSA on CPAP  - CPAP compliance excellent - Good treatment of AHI  - Encourage patient to use CPAP nightly and > 4 hours each night - F/U in 1 year or sooner if needed   I spent 20 minutes of face-to-face and non-face-to-face time with patient.  This included previsit chart review, lab review, study review, order entry, electronic health record documentation, patient education.  Butch Penny, MSN, NP-C 11/08/2019, 2:40 PM [facility] [address] [place], [place] [address] [phone]

## 2019-11-08 NOTE — Patient Instructions (Signed)
Continue using CPAP nightly and greater than 4 hours each night °If your symptoms worsen or you develop new symptoms please let us know.  ° °

## 2020-01-11 ENCOUNTER — Other Ambulatory Visit: Payer: Self-pay

## 2020-01-11 ENCOUNTER — Telehealth: Payer: Self-pay

## 2020-01-11 ENCOUNTER — Other Ambulatory Visit: Payer: Self-pay | Admitting: Family Medicine

## 2020-01-11 DIAGNOSIS — R772 Abnormality of alphafetoprotein: Secondary | ICD-10-CM

## 2020-01-11 MED ORDER — GABAPENTIN 300 MG PO CAPS: 300.0000 mg | ORAL_CAPSULE | Freq: Three times a day (TID) | ORAL | 3 refills | Status: DC | PRN

## 2020-01-11 NOTE — Telephone Encounter (Signed)
I will call it in 

## 2020-01-11 NOTE — Telephone Encounter (Signed)
Pt would like to start Gabapentin as he discussed with his Provider.

## 2020-01-15 ENCOUNTER — Other Ambulatory Visit: Payer: Self-pay

## 2020-01-15 ENCOUNTER — Other Ambulatory Visit: Payer: 59

## 2020-01-15 DIAGNOSIS — R772 Abnormality of alphafetoprotein: Secondary | ICD-10-CM

## 2020-01-16 LAB — HEPATIC FUNCTION PANEL
AG Ratio: 2.5 (calc) (ref 1.0–2.5)
ALT: 25 U/L (ref 9–46)
AST: 20 U/L (ref 10–40)
Albumin: 4.5 g/dL (ref 3.6–5.1)
Alkaline phosphatase (APISO): 51 U/L (ref 36–130)
Bilirubin, Direct: 0.2 mg/dL (ref 0.0–0.2)
Globulin: 1.8 g/dL (calc) — ABNORMAL LOW (ref 1.9–3.7)
Indirect Bilirubin: 0.6 mg/dL (calc) (ref 0.2–1.2)
Total Bilirubin: 0.8 mg/dL (ref 0.2–1.2)
Total Protein: 6.3 g/dL (ref 6.1–8.1)

## 2020-01-16 LAB — RENAL FUNCTION PANEL
Albumin: 4.5 g/dL (ref 3.6–5.1)
BUN: 18 mg/dL (ref 7–25)
CO2: 29 mmol/L (ref 20–32)
Calcium: 9.6 mg/dL (ref 8.6–10.3)
Chloride: 106 mmol/L (ref 98–110)
Creat: 1.05 mg/dL (ref 0.60–1.35)
Glucose, Bld: 110 mg/dL — ABNORMAL HIGH (ref 65–99)
Phosphorus: 3.1 mg/dL (ref 2.5–4.5)
Potassium: 4.8 mmol/L (ref 3.5–5.3)
Sodium: 141 mmol/L (ref 135–146)

## 2020-04-10 IMAGING — CT CT L SPINE W/O CM
3 of 6 series · 11 of 35 positions shown, 12 images · non-contrast
Comparison: None.

CLINICAL DATA: Congenital spondylolysis of lumbosacral region

EXAM:
CT LUMBAR SPINE WITHOUT CONTRAST
TECHNIQUE: Multidetector CT imaging of the lumbar spine was performed without
intravenous contrast administration. Multiplanar CT image
reconstructions were also generated.

[Series 6: sag bone · sagittal · 0.31mm/px · 6 of 83 slices shown]
[im 14/83  bone]
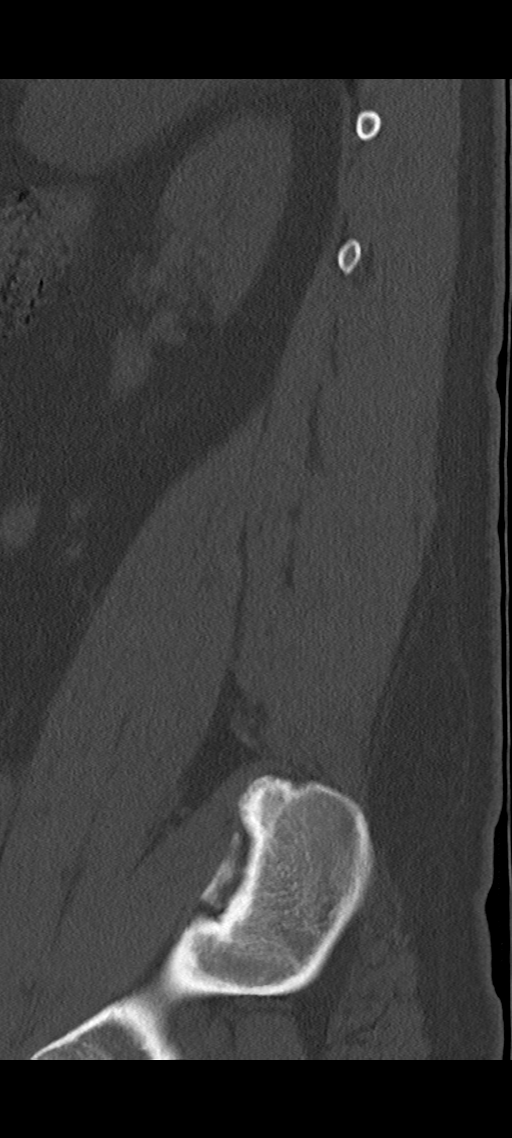
[im 28/83  bone]
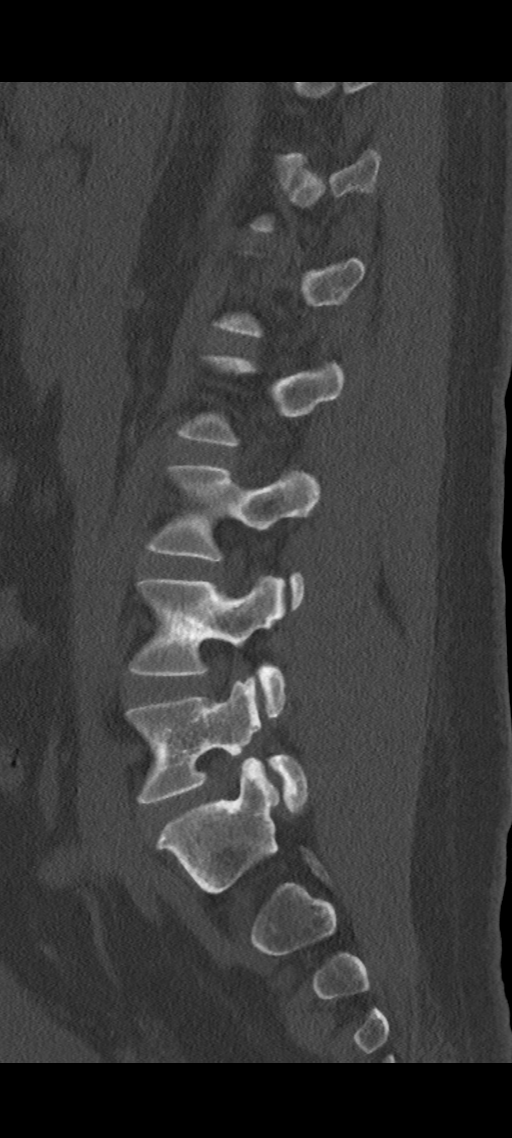
[im 42/83  bone]
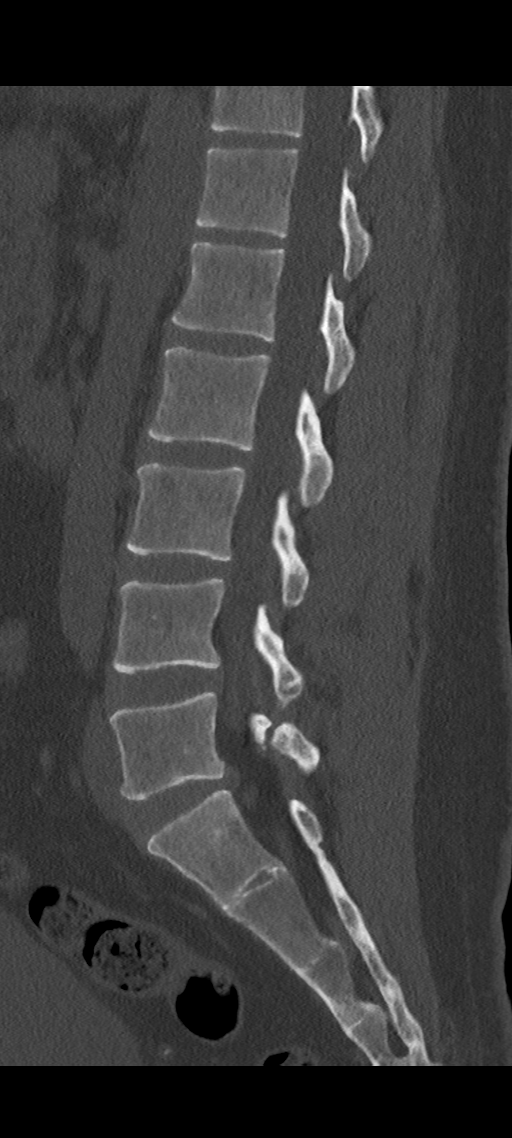
[im 55/83  bone]
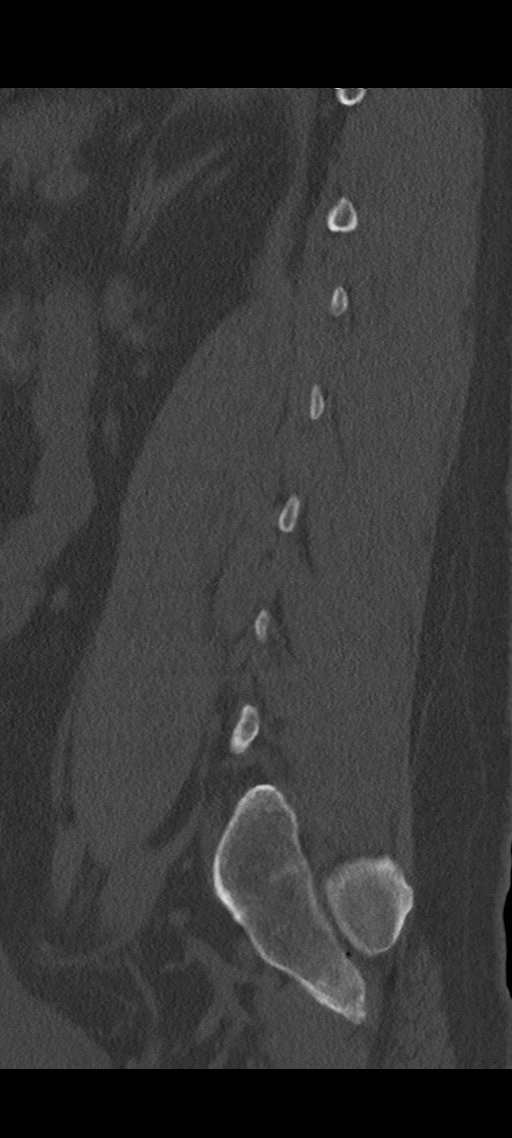
[im 69/83  bone]
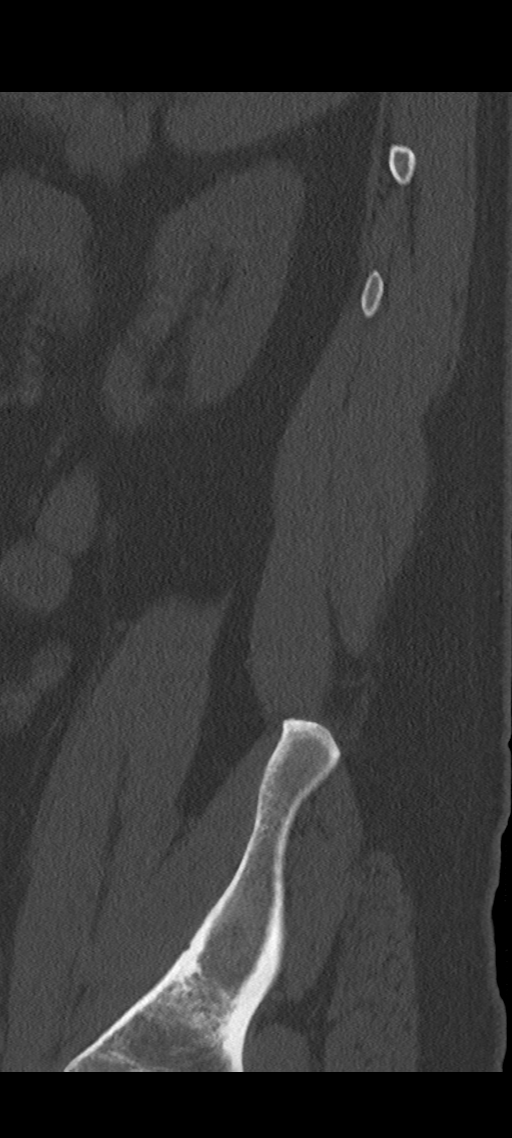
[im 81/83  soft-tissue]
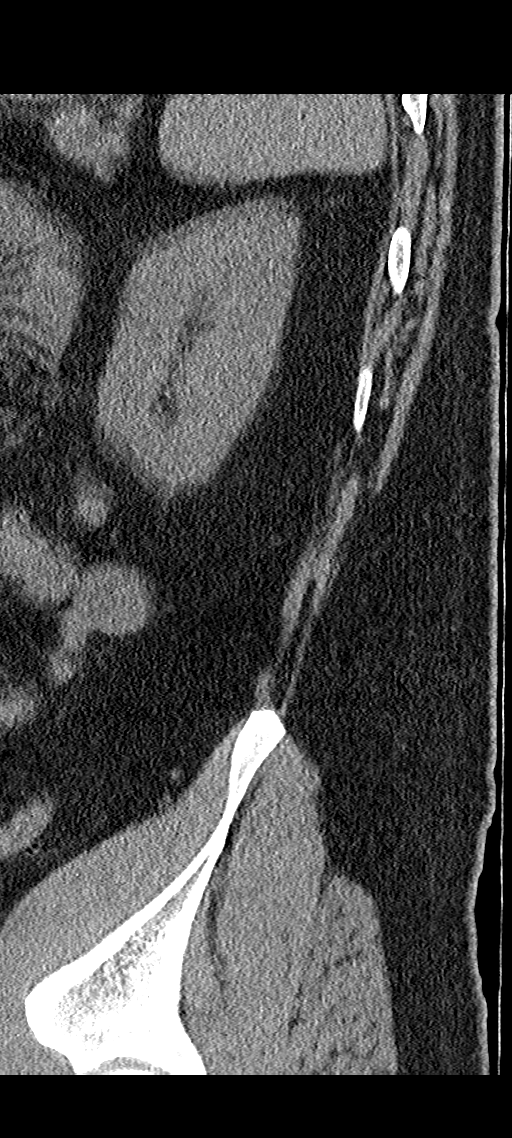

[Series 7: cor bone · coronal · 0.34mm/px · 1 of 77 slices shown]
[im 39/77  bone]
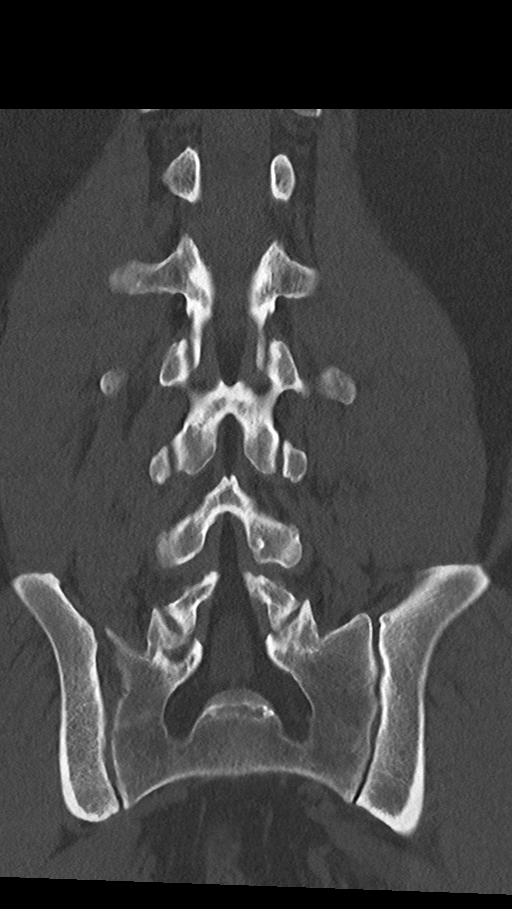

[Series 11: orthogonal · axial · 0.21mm/px · z∈[+867,+1058]mm · 4 of 133 slices shown, 5 images]
[im 23/133  soft-tissue]
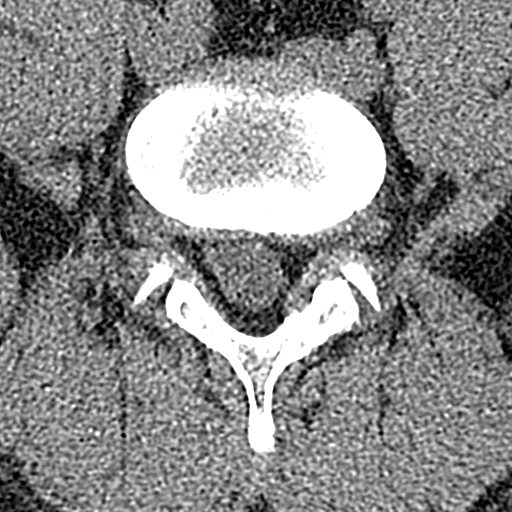
[im 23/133  bone]
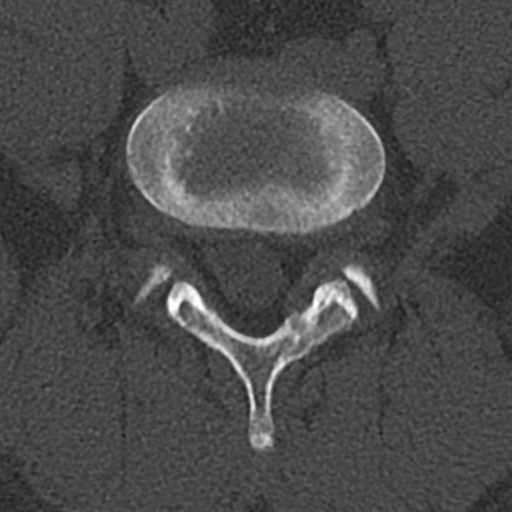
[im 45/133  bone]
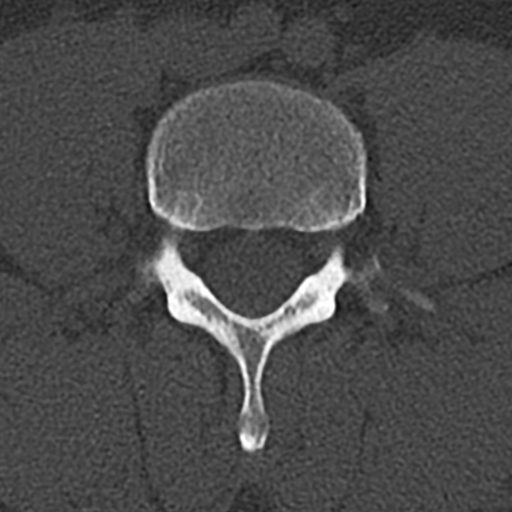
[im 89/133  bone]
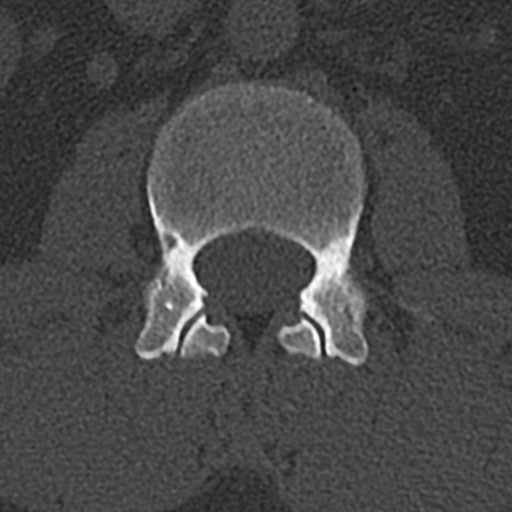
[im 111/133  bone]
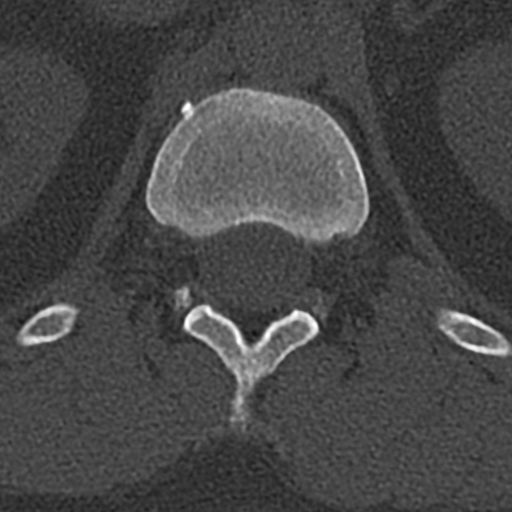

[11 of 35 positions shown; findings below may reference images not displayed]

FINDINGS: Segmentation: 5 lumbar type vertebrae.

Alignment: There is no spondylolisthesis.

Vertebrae: Chronic bilateral spondylolysis at L5. Vertebral body
heights are maintained. There is no sclerotic or destructive osseous
lesion.

Paraspinal and other soft tissues: Unremarkable.

Disc levels: Mild disc space narrowing at L5-S1. A small disc bulge
is present at this level. No canal stenosis. There is mild to
moderate narrowing of the neural foramina.
IMPRESSION: Chronic bilateral spondylolysis at L5 without spondylolisthesis.
Mild to moderate neural foraminal stenosis at L5-S1.

## 2020-05-03 ENCOUNTER — Ambulatory Visit (INDEPENDENT_AMBULATORY_CARE_PROVIDER_SITE_OTHER): Payer: 59 | Admitting: Family Medicine

## 2020-05-03 ENCOUNTER — Other Ambulatory Visit: Payer: Self-pay

## 2020-05-03 ENCOUNTER — Encounter: Payer: Self-pay | Admitting: Family Medicine

## 2020-05-03 VITALS — BP 134/86 | HR 74 | Temp 98.1°F | Ht 68.0 in | Wt 200.0 lb

## 2020-05-03 DIAGNOSIS — M5412 Radiculopathy, cervical region: Secondary | ICD-10-CM

## 2020-05-03 MED ORDER — PREDNISONE 20 MG PO TABS: ORAL_TABLET | ORAL | 0 refills | Status: DC

## 2020-05-03 NOTE — Progress Notes (Signed)
Subjective:    Patient ID: Rick Maldonado, male    DOB: 1985/05/12, 35 y.o.   MRN: [id number]  HPI Patient is a very pleasant 35 year old Caucasian male who presents today complaining of right shoulder pain.  He states the pain has been present for over a month.  At first he thought it was in his shoulder however it started radiating down his right arm.  It would radiate from his shoulder all the way into his right hand.  He also experienced some numbness and tingling.  He was having an aching dull pain radiated from his neck into his hand.  The pain has been progressively getting worse.  He attributes it to the way he sits at work constantly looking down at a computer.  He is also been sleeping awkwardly on pillows at night.  He denies any bowel or bladder incontinence.  He denies any leg weakness.  He has no pain with range of motion of the right shoulder.  He has a negative Hawking sign, negative O'Brien sign, he also has a negative Spurling sign.  He has no weakness in the right arm.  There is no crepitus in the right shoulder.  He has normal reflexes checked throughout the right arm and normal sensation.  Past Medical History:  Diagnosis Date  . Acid reflux   . DDD (degenerative disc disease), lumbosacral    bilateral neural foraminal stenosis at L5-S1, mass effect on B exiting L5 nerve roots (2/20)  . Dyslipidemia   . Sleep apnea    cpap  . Spondylolysis    Past Surgical History:  Procedure Laterality Date  . ABDOMINAL EXPOSURE N/A 03/13/2019   Procedure: ABDOMINAL EXPOSURE;  Surgeon: Cephus Shelling, MD;  Location: [facility] OR;  Service: Vascular;  Laterality: N/A;  . ANTERIOR LUMBAR FUSION N/A 03/13/2019   Procedure: Lumbar five Sacral one Anterior lumbar interbody fusion with percutaneous posterior instrumentation;  Surgeon: Shirlean Kelly, MD;  Location: [facility] OR;  Service: Neurosurgery;  Laterality: N/A;  . APPLICATION OF ROBOTIC ASSISTANCE FOR SPINAL PROCEDURE N/A 03/13/2019    Procedure: APPLICATION OF ROBOTIC ASSISTANCE FOR SPINAL PROCEDURE;  Surgeon: Shirlean Kelly, MD;  Location: [facility] OR;  Service: Neurosurgery;  Laterality: N/A;  . LUMBAR PERCUTANEOUS PEDICLE SCREW 1 LEVEL N/A 03/13/2019   Procedure: LUMBAR PERCUTANEOUS PEDICLE SCREW LUMBAR FIVE-SACRAL ONE LEVEL;  Surgeon: Shirlean Kelly, MD;  Location: [facility] OR;  Service: Neurosurgery;  Laterality: N/A;  . TONSILLECTOMY     as a child   No current outpatient medications on file prior to visit.   No current facility-administered medications on file prior to visit.   Allergies  Allergen Reactions  . Morphine And Related Anaphylaxis    03/13/19 Per pt, he was told that he had throat closing to morphine when his tonsils were removed as a child. Has tolerated oxycodone and hydromorphone for various procedures as an adult   . Codeine Other (See Comments)    Reaction unknown  . Penicillins Other (See Comments)    Reaction unknown Did it involve swelling of the face/tongue/throat, SOB, or low BP? Unknown Did it involve sudden or severe rash/hives, skin peeling, or any reaction on the inside of your mouth or nose? Unknown Did you need to seek medical attention at a hospital or doctor's office? Unknown When did it last happen?Informed as a child that he was allergic If all above answers are "NO", may proceed with cephalosporin use.   . Sulfa Antibiotics Other (See Comments)  Reaction unknown   Social History   Socioeconomic History  . Marital status: Married    Spouse name: [name]  . Number of children: 0  . Years of education: BS  . Highest education level: Not on file  Occupational History  . Occupation: Nature conservation officer    Comment: [employer]  Tobacco Use  . Smoking status: Never Smoker  . Smokeless tobacco: Never Used  Substance and Sexual Activity  . Alcohol use: Yes    Comment: rarely  . Drug use: No  . Sexual activity: Not on file  Other Topics Concern  . Not on file  Social History  Narrative   Patient consumes 2 glasses of caffeine daily,is right handed   Social Determinants of Health   Financial Resource Strain: Not on file  Food Insecurity: Not on file  Transportation Needs: Not on file  Physical Activity: Not on file  Stress: Not on file  Social Connections: Not on file  Intimate Partner Violence: Not on file     Review of Systems     Objective:   Physical Exam Vitals reviewed.  Constitutional:      General: He is not in acute distress.    Appearance: Normal appearance. He is well-developed and normal weight. He is not ill-appearing, toxic-appearing or diaphoretic.  HENT:     Head: Normocephalic and atraumatic.     Right Ear: Tympanic membrane and ear canal normal. There is no impacted cerumen.     Left Ear: Tympanic membrane and ear canal normal. There is no impacted cerumen.     Nose: Nose normal. No congestion or rhinorrhea.     Mouth/Throat:     Mouth: Mucous membranes are moist.     Pharynx: No oropharyngeal exudate or posterior oropharyngeal erythema.  Eyes:     General: No scleral icterus.       Right eye: No discharge.        Left eye: No discharge.     Extraocular Movements: Extraocular movements intact.     Conjunctiva/sclera: Conjunctivae normal.     Pupils: Pupils are equal, round, and reactive to light.  Neck:     Vascular: No carotid bruit.  Cardiovascular:     Rate and Rhythm: Normal rate and regular rhythm.     Pulses: Normal pulses.     Heart sounds: Normal heart sounds. No murmur heard. No friction rub. No gallop.   Pulmonary:     Effort: Pulmonary effort is normal. No respiratory distress.     Breath sounds: Normal breath sounds. No stridor. No wheezing, rhonchi or rales.  Chest:     Chest wall: No tenderness.  Abdominal:     General: Abdomen is flat. Bowel sounds are normal. There is no distension.     Palpations: Abdomen is soft. There is no mass.     Tenderness: There is no abdominal tenderness. There is no guarding  or rebound.     Hernia: No hernia is present.  Musculoskeletal:     Cervical back: Normal range of motion and neck supple. Spasms and tenderness present. No swelling, deformity, signs of trauma, torticollis or bony tenderness. No pain with movement or muscular tenderness. Normal range of motion.     Right lower leg: No edema.     Left lower leg: No edema.  Lymphadenopathy:     Cervical: No cervical adenopathy.  Skin:    General: Skin is warm.     Coloration: Skin is not jaundiced or pale.  Findings: No bruising, erythema, lesion or rash.  Neurological:     General: No focal deficit present.     Mental Status: He is alert and oriented to person, place, and time.     Cranial Nerves: No cranial nerve deficit.     Sensory: No sensory deficit.     Motor: No weakness.     Coordination: Coordination normal.     Gait: Gait normal.     Deep Tendon Reflexes: Reflexes normal.  Psychiatric:        Mood and Affect: Mood normal.        Behavior: Behavior normal.        Thought Content: Thought content normal.        Judgment: Judgment normal.    Assessment & Plan:  Cervical radiculopathy - Plan: DG Cervical Spine Complete  Symptoms sound like cervical radiculopathy.  Begin by obtaining a C-spine x-ray.  Begin with a prednisone taper pack for likely a herniated disc in the neck.  Reassess in 1 to 2 weeks to see how his symptoms are doing.  If persistent would recommend an MRI.

## 2020-05-17 ENCOUNTER — Ambulatory Visit
Admission: RE | Admit: 2020-05-17 | Discharge: 2020-05-17 | Disposition: A | Discharge status: Home / Self Care | Payer: 59 | Source: Ambulatory Visit | Attending: Family Medicine | Admitting: Family Medicine

## 2020-05-17 DIAGNOSIS — M5412 Radiculopathy, cervical region: Secondary | ICD-10-CM

## 2020-06-03 ENCOUNTER — Encounter: Payer: Self-pay | Admitting: Family Medicine

## 2020-06-04 ENCOUNTER — Other Ambulatory Visit: Payer: Self-pay | Admitting: Family Medicine

## 2020-06-04 DIAGNOSIS — M5412 Radiculopathy, cervical region: Secondary | ICD-10-CM

## 2020-06-04 IMAGING — RF DG LUMBAR SPINE 2-3V
1 series · 2 of 2 positions shown · non-contrast
Comparison: 01/17/2019 lumbar spine CT

CLINICAL DATA: L5-S1 ALIF

EXAM:
LUMBAR SPINE - 2-3 VIEW; DG C-ARM 1-60 MIN

[Series 1: run · 2 of 2 slices shown]
[im 1/2]
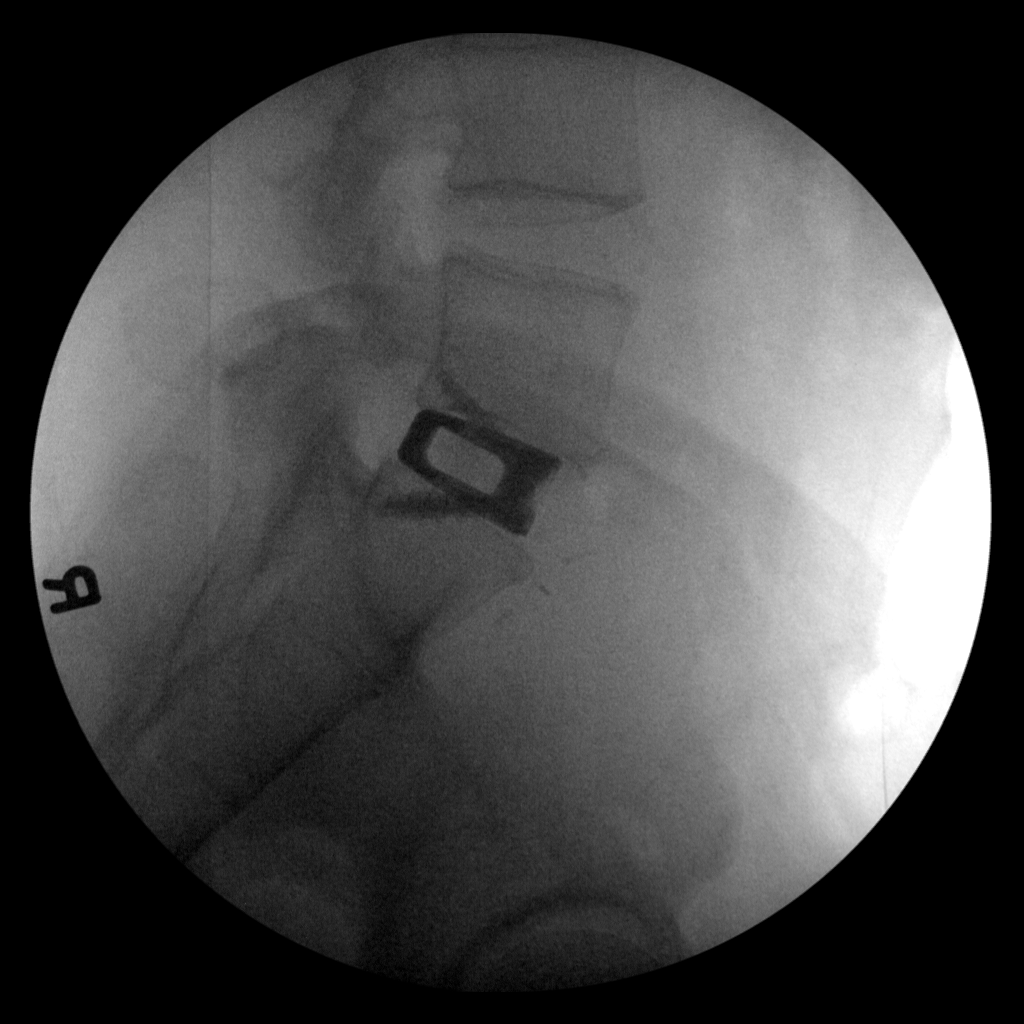
[im 2/2]
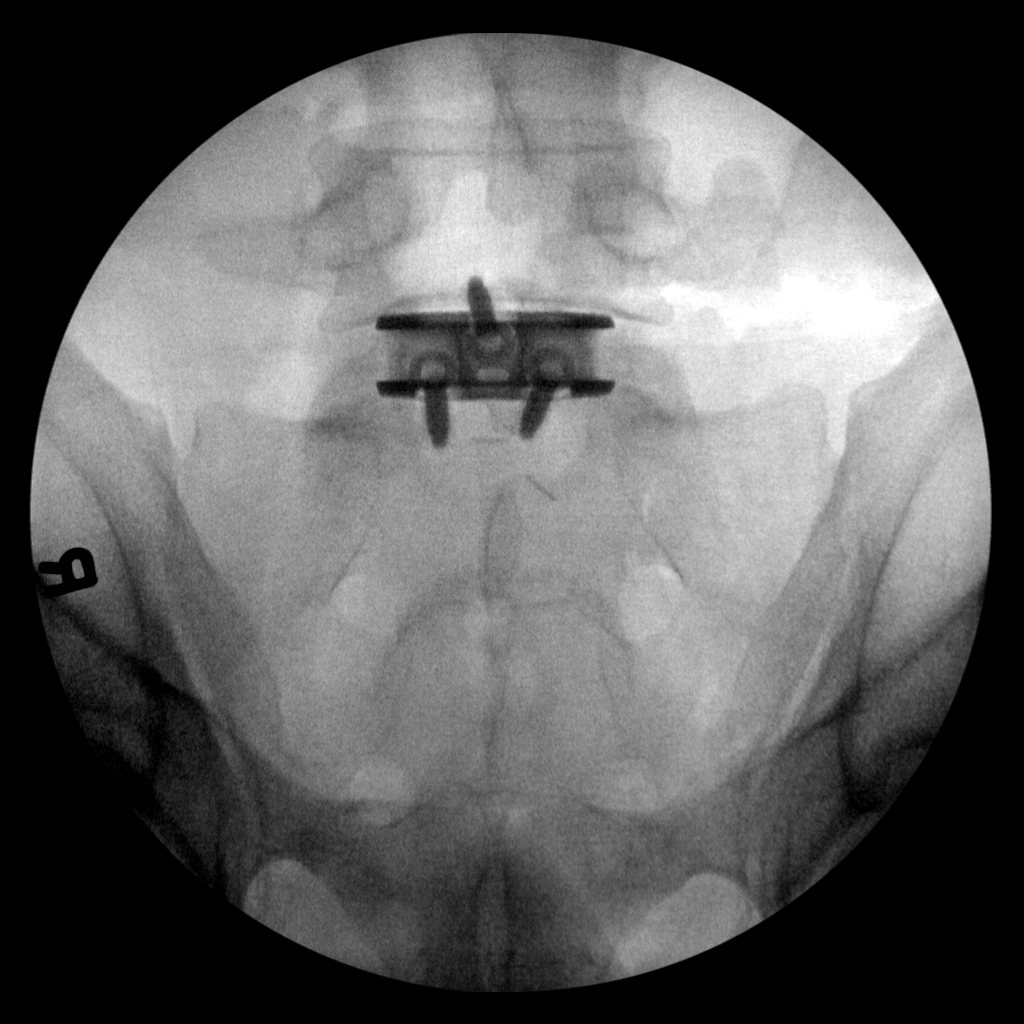

[2 of 2 positions shown; findings below may reference images not displayed]

FLUOROSCOPY TIME:  Fluoroscopy Time:  0 minutes 39 seconds

Number of Acquired Spot Images: 2
FINDINGS: Nondiagnostic spot fluoroscopic intraoperative radiographs
demonstrate postsurgical changes from bilateral posterior spinal
fusion at L5-S1 with bone cage with associated endplate screws in
the L5-S1 disc space.
IMPRESSION: Intraoperative fluoroscopic guidance for L5-S1 fusion.

## 2020-07-05 ENCOUNTER — Other Ambulatory Visit: Payer: Self-pay

## 2020-07-05 ENCOUNTER — Ambulatory Visit: Payer: PRIVATE HEALTH INSURANCE

## 2020-08-20 ENCOUNTER — Other Ambulatory Visit: Payer: 59

## 2020-08-20 ENCOUNTER — Other Ambulatory Visit: Payer: Self-pay

## 2020-08-20 DIAGNOSIS — Z13220 Encounter for screening for lipoid disorders: Secondary | ICD-10-CM

## 2020-08-20 DIAGNOSIS — Z0000 Encounter for general adult medical examination without abnormal findings: Secondary | ICD-10-CM

## 2020-08-20 DIAGNOSIS — R7303 Prediabetes: Secondary | ICD-10-CM

## 2020-08-20 DIAGNOSIS — E785 Hyperlipidemia, unspecified: Secondary | ICD-10-CM

## 2020-08-20 DIAGNOSIS — Z136 Encounter for screening for cardiovascular disorders: Secondary | ICD-10-CM

## 2020-08-21 LAB — COMPLETE METABOLIC PANEL WITH GFR
AG Ratio: 2.4 (calc) (ref 1.0–2.5)
ALT: 31 U/L (ref 9–46)
AST: 20 U/L (ref 10–40)
Albumin: 4.4 g/dL (ref 3.6–5.1)
Alkaline phosphatase (APISO): 48 U/L (ref 36–130)
BUN: 14 mg/dL (ref 7–25)
CO2: 28 mmol/L (ref 20–32)
Calcium: 9.2 mg/dL (ref 8.6–10.3)
Chloride: 105 mmol/L (ref 98–110)
Creat: 1.13 mg/dL (ref 0.60–1.35)
GFR, Est African American: 96 mL/min/{1.73_m2} (ref >=60)
GFR, Est Non African American: 83 mL/min/{1.73_m2} (ref >=60)
Globulin: 1.8 g/dL (calc) — ABNORMAL LOW (ref 1.9–3.7)
Glucose, Bld: 112 mg/dL — ABNORMAL HIGH (ref 65–99)
Potassium: 5 mmol/L (ref 3.5–5.3)
Sodium: 139 mmol/L (ref 135–146)
Total Bilirubin: 0.8 mg/dL (ref 0.2–1.2)
Total Protein: 6.2 g/dL (ref 6.1–8.1)

## 2020-08-21 LAB — CBC WITH DIFFERENTIAL/PLATELET
Absolute Monocytes: 517 cells/uL (ref 200–950)
Basophils Absolute: 22 cells/uL (ref 0–200)
Basophils Relative: 0.4 %
Eosinophils Absolute: 138 cells/uL (ref 15–500)
Eosinophils Relative: 2.5 %
HCT: 45.4 % (ref 38.5–50.0)
Hemoglobin: 15.1 g/dL (ref 13.2–17.1)
Lymphs Abs: 1656 cells/uL (ref 850–3900)
MCH: 30.8 pg (ref 27.0–33.0)
MCHC: 33.3 g/dL (ref 32.0–36.0)
MCV: 92.7 fL (ref 80.0–100.0)
MPV: 10.5 fL (ref 7.5–12.5)
Monocytes Relative: 9.4 %
Neutro Abs: 3168 cells/uL (ref 1500–7800)
Neutrophils Relative %: 57.6 %
Platelets: 185 10*3/uL (ref 140–400)
RBC: 4.9 10*6/uL (ref 4.20–5.80)
RDW: 12.2 % (ref 11.0–15.0)
Total Lymphocyte: 30.1 %
WBC: 5.5 10*3/uL (ref 3.8–10.8)

## 2020-08-21 LAB — LIPID PANEL
Cholesterol: 144 mg/dL (ref <200)
HDL: 30 mg/dL — ABNORMAL LOW (ref >=40)
LDL Cholesterol (Calc): 91 mg/dL (calc)
Non-HDL Cholesterol (Calc): 114 mg/dL (calc) (ref <130)
Total CHOL/HDL Ratio: 4.8 (calc) (ref <5.0)
Triglycerides: 131 mg/dL (ref <150)

## 2020-08-21 LAB — HEMOGLOBIN A1C
Hgb A1c MFr Bld: 5.1 % of total Hgb (ref <5.7)
Mean Plasma Glucose: 100 mg/dL
eAG (mmol/L): 5.5 mmol/L

## 2020-08-21 LAB — MICROALBUMIN / CREATININE URINE RATIO
Creatinine, Urine: 239 mg/dL (ref 20–320)
Microalb Creat Ratio: 2 mcg/mg creat (ref <30)
Microalb, Ur: 0.5 mg/dL

## 2020-08-23 ENCOUNTER — Other Ambulatory Visit: Payer: Self-pay

## 2020-08-23 ENCOUNTER — Ambulatory Visit (INDEPENDENT_AMBULATORY_CARE_PROVIDER_SITE_OTHER): Payer: 59 | Admitting: Family Medicine

## 2020-08-23 ENCOUNTER — Encounter: Payer: Self-pay | Admitting: Family Medicine

## 2020-08-23 VITALS — BP 116/68 | HR 64 | Temp 97.9°F | Resp 14 | Ht 68.0 in | Wt 199.0 lb

## 2020-08-23 DIAGNOSIS — E785 Hyperlipidemia, unspecified: Secondary | ICD-10-CM | POA: Diagnosis not present

## 2020-08-23 DIAGNOSIS — M5412 Radiculopathy, cervical region: Secondary | ICD-10-CM | POA: Diagnosis not present

## 2020-08-23 DIAGNOSIS — R7303 Prediabetes: Secondary | ICD-10-CM | POA: Diagnosis not present

## 2020-08-23 DIAGNOSIS — Z0001 Encounter for general adult medical examination with abnormal findings: Secondary | ICD-10-CM | POA: Diagnosis not present

## 2020-08-23 DIAGNOSIS — Z0000 Encounter for general adult medical examination without abnormal findings: Secondary | ICD-10-CM

## 2020-08-23 MED ORDER — METHOCARBAMOL 500 MG PO TABS: 500.0000 mg | ORAL_TABLET | Freq: Four times a day (QID) | ORAL | 0 refills | Status: DC | PRN

## 2020-08-23 NOTE — Progress Notes (Signed)
Subjective:    Patient ID: Rick Maldonado, male    DOB: October 04, 1985, 35 y.o.   MRN: [id number]  HPI Patient is a very pleasant 35 year old Caucasian male here today for a complete physical exam.  He also has a history of sleep apnea and he has been consistently wearing his CPAP machine.  However both he and his wife have noticed that he has started snoring more at night.  He also reports feeling tired.  We have seen him in the past for cervical radiculopathy.  We gave him a prednisone taper which helped a little bit.  He is also seen a chiropractor which helped substantially and dropped his pain from an 8 to a 3.  However he continues to have pain and stiffness in the muscles in the right side of his neck.  Occasionally he will have some numbness and tingling in his right arm but is primarily muscle stiffness in the right side of his neck.  He has had 1 COVID shot.  He is due for a booster.  His tetanus shot is up-to-date.  His most recent lab work is listed below Lab on 08/20/2020  Component Date Value Ref Range Status   WBC 08/20/2020 5.5  3.8 - 10.8 Thousand/uL Final   RBC 08/20/2020 4.90  4.20 - 5.80 Million/uL Final   Hemoglobin 08/20/2020 15.1  13.2 - 17.1 g/dL Final   HCT 25/95/6387 45.4  38.5 - 50.0 % Final   MCV 08/20/2020 92.7  80.0 - 100.0 fL Final   MCH 08/20/2020 30.8  27.0 - 33.0 pg Final   MCHC 08/20/2020 33.3  32.0 - 36.0 g/dL Final   RDW 56/43/3295 12.2  11.0 - 15.0 % Final   Platelets 08/20/2020 185  140 - 400 Thousand/uL Final   MPV 08/20/2020 10.5  7.5 - 12.5 fL Final   Neutro Abs 08/20/2020 3,168  1,500 - 7,800 cells/uL Final   Lymphs Abs 08/20/2020 1,656  850 - 3,900 cells/uL Final   Absolute Monocytes 08/20/2020 517  200 - 950 cells/uL Final   Eosinophils Absolute 08/20/2020 138  15 - 500 cells/uL Final   Basophils Absolute 08/20/2020 22  0 - 200 cells/uL Final   Neutrophils Relative % 08/20/2020 57.6  % Final   Total Lymphocyte 08/20/2020 30.1  % Final   Monocytes  Relative 08/20/2020 9.4  % Final   Eosinophils Relative 08/20/2020 2.5  % Final   Basophils Relative 08/20/2020 0.4  % Final   Glucose, Bld 08/20/2020 112 (A) 65 - 99 mg/dL Final   Comment: .            Fasting reference interval . For someone without known diabetes, a glucose value between 100 and 125 mg/dL is consistent with prediabetes and should be confirmed with a follow-up test. .    BUN 08/20/2020 14  7 - 25 mg/dL Final   Creat 18/84/1660 1.13  0.60 - 1.35 mg/dL Final   GFR, Est Non African American 08/20/2020 83  > OR = 60 mL/min/1.46m2 Final   GFR, Est African American 08/20/2020 96  > OR = 60 mL/min/1.36m2 Final   BUN/Creatinine Ratio 08/20/2020 NOT APPLICABLE  6 - 22 (calc) Final   Sodium 08/20/2020 139  135 - 146 mmol/L Final   Potassium 08/20/2020 5.0  3.5 - 5.3 mmol/L Final   Chloride 08/20/2020 105  98 - 110 mmol/L Final   CO2 08/20/2020 28  20 - 32 mmol/L Final   Calcium 08/20/2020 9.2  8.6 - 10.3  mg/dL Final   Total Protein 67/89/3810 6.2  6.1 - 8.1 g/dL Final   Albumin 17/51/0258 4.4  3.6 - 5.1 g/dL Final   Globulin 52/77/8242 1.8 (A) 1.9 - 3.7 g/dL (calc) Final   AG Ratio 08/20/2020 2.4  1.0 - 2.5 (calc) Final   Total Bilirubin 08/20/2020 0.8  0.2 - 1.2 mg/dL Final   Alkaline phosphatase (APISO) 08/20/2020 48  36 - 130 U/L Final   AST 08/20/2020 20  10 - 40 U/L Final   ALT 08/20/2020 31  9 - 46 U/L Final   Cholesterol 08/20/2020 144  <200 mg/dL Final   HDL 35/36/1443 30 (A) > OR = 40 mg/dL Final   Triglycerides 15/40/0867 131  <150 mg/dL Final   LDL Cholesterol (Calc) 08/20/2020 91  mg/dL (calc) Final   Comment: Reference range: <100 . Desirable range <100 mg/dL for primary prevention;   <70 mg/dL for patients with CHD or diabetic patients  with > or = 2 CHD risk factors. Marland Kitchen LDL-C is now calculated using the Martin-Hopkins  calculation, which is a validated novel method providing  better accuracy than the Friedewald equation in the  estimation of LDL-C.   Horald Pollen et al. Lenox Ahr. 6195;093(26): 2061-2068  (http://education.[facility].com/faq/FAQ164)    Total CHOL/HDL Ratio 08/20/2020 4.8  <7.1 (calc) Final   Non-HDL Cholesterol (Calc) 08/20/2020 114  <130 mg/dL (calc) Final   Comment: For patients with diabetes plus 1 major ASCVD risk  factor, treating to a non-HDL-C goal of <100 mg/dL  (LDL-C of <24 mg/dL) is considered a therapeutic  option.    Hgb A1c MFr Bld 08/20/2020 5.1  <5.7 % of total Hgb Final   Comment: For the purpose of screening for the presence of diabetes: . <5.7%       Consistent with the absence of diabetes 5.7-6.4%    Consistent with increased risk for diabetes             (prediabetes) > or =6.5%  Consistent with diabetes . This assay result is consistent with a decreased risk of diabetes. . Currently, no consensus exists regarding use of hemoglobin A1c for diagnosis of diabetes in children. . According to American Diabetes Association (ADA) guidelines, hemoglobin A1c <7.0% represents optimal control in non-pregnant diabetic patients. Different metrics may apply to specific patient populations.  Standards of Medical Care in Diabetes(ADA). .    Mean Plasma Glucose 08/20/2020 100  mg/dL Final   eAG (mmol/L) 58/11/9831 5.5  mmol/L Final   Creatinine, Urine 08/20/2020 239  20 - 320 mg/dL Final   Microalb, Ur 82/50/5397 0.5  mg/dL Final   Comment: Reference Range Not established    Microalb Creat Ratio 08/20/2020 2  <30 mcg/mg creat Final   Comment: . The ADA defines abnormalities in albumin excretion as follows: Marland Kitchen Albuminuria Category        Result (mcg/mg creatinine) . Normal to Mildly increased   <30 Moderately increased         30-299  Severely increased           > OR = 300 . The ADA recommends that at least two of three specimens collected within a 3-6 month period be abnormal before considering a patient to be within a diagnostic category.     Past Medical History:  Diagnosis Date    Acid reflux    DDD (degenerative disc disease), lumbosacral    bilateral neural foraminal stenosis at L5-S1, mass effect on B exiting L5 nerve roots (2/20)   Dyslipidemia  Sleep apnea    cpap   Spondylolysis    Past Surgical History:  Procedure Laterality Date   ABDOMINAL EXPOSURE N/A 03/13/2019   Procedure: ABDOMINAL EXPOSURE;  Surgeon: Cephus Shellinglark, Christopher J, MD;  [facility];  Service: Vascular;  Laterality: N/A;   ANTERIOR LUMBAR FUSION N/A 03/13/2019   Procedure: Lumbar five Sacral one Anterior lumbar interbody fusion with percutaneous posterior instrumentation;  Surgeon: Shirlean KellyNudelman, Robert, MD;  [facility];  Service: Neurosurgery;  Laterality: N/A;   APPLICATION OF ROBOTIC ASSISTANCE FOR SPINAL PROCEDURE N/A 03/13/2019   Procedure: APPLICATION OF ROBOTIC ASSISTANCE FOR SPINAL PROCEDURE;  Surgeon: Shirlean KellyNudelman, Robert, MD;  [facility];  Service: Neurosurgery;  Laterality: N/A;   LUMBAR PERCUTANEOUS PEDICLE SCREW 1 LEVEL N/A 03/13/2019   Procedure: LUMBAR PERCUTANEOUS PEDICLE SCREW LUMBAR FIVE-SACRAL ONE LEVEL;  Surgeon: Shirlean KellyNudelman, Robert, MD;  [facility];  Service: Neurosurgery;  Laterality: N/A;   TONSILLECTOMY     as a child   No current outpatient medications on file prior to visit.   No current facility-administered medications on file prior to visit.   Allergies  Allergen Reactions   Morphine And Related Anaphylaxis    03/13/19 Per pt, he was told that he had throat closing to morphine when his tonsils were removed as a child. Has tolerated oxycodone and hydromorphone for various procedures as an adult    Codeine Other (See Comments)    Reaction unknown   Penicillins Other (See Comments)    Reaction unknown Did it involve swelling of the face/tongue/throat, SOB, or low BP? Unknown Did it involve sudden or severe rash/hives, skin peeling, or any reaction on the inside of your mouth or nose? Unknown Did you need to seek medical attention at a hospital or doctor's  office? Unknown When did it last happen? Informed as a child that he was allergic  If all above answers are "NO", may proceed with cephalosporin use.    Sulfa Antibiotics Other (See Comments)    Reaction unknown   Social History   Socioeconomic History   Marital status: Married    Spouse name: Natalia LeatherwoodKatherine   Number of children: 0   Years of education: BS   Highest education level: Not on file  Occupational History   Occupation: Nature conservation officerperations Manager    Comment: [employer]  Tobacco Use   Smoking status: Never   Smokeless tobacco: Never  Substance and Sexual Activity   Alcohol use: Yes    Comment: rarely   Drug use: No   Sexual activity: Not on file  Other Topics Concern   Not on file  Social History Narrative   Patient consumes 2 glasses of caffeine daily,is right handed   Social Determinants of Health   Financial Resource Strain: Not on file  Food Insecurity: Not on file  Transportation Needs: Not on file  Physical Activity: Not on file  Stress: Not on file  Social Connections: Not on file  Intimate Partner Violence: Not on file     Review of Systems     Objective:   Physical Exam Vitals reviewed.  Constitutional:      General: He is not in acute distress.    Appearance: Normal appearance. He is normal weight. He is not ill-appearing, toxic-appearing or diaphoretic.  HENT:     Head: Normocephalic and atraumatic.     Right Ear: Tympanic membrane and ear canal normal. There is no impacted cerumen.     Left Ear: Tympanic membrane and ear canal normal. There is  no impacted cerumen.     Nose: Nose normal. No congestion or rhinorrhea.     Mouth/Throat:     Mouth: Mucous membranes are moist.     Pharynx: No oropharyngeal exudate or posterior oropharyngeal erythema.  Eyes:     General: No scleral icterus.       Right eye: No discharge.        Left eye: No discharge.     Extraocular Movements: Extraocular movements intact.     Conjunctiva/sclera: Conjunctivae normal.      Pupils: Pupils are equal, round, and reactive to light.  Neck:     Vascular: No carotid bruit.  Cardiovascular:     Rate and Rhythm: Normal rate and regular rhythm.     Pulses: Normal pulses.     Heart sounds: Normal heart sounds. No murmur heard.   No friction rub. No gallop.  Pulmonary:     Effort: Pulmonary effort is normal. No respiratory distress.     Breath sounds: Normal breath sounds. No stridor. No wheezing, rhonchi or rales.  Chest:     Chest wall: No tenderness.  Abdominal:     General: Abdomen is flat. Bowel sounds are normal. There is no distension.     Palpations: Abdomen is soft. There is no mass.     Tenderness: There is no abdominal tenderness. There is no guarding or rebound.     Hernia: No hernia is present.  Musculoskeletal:     Cervical back: Normal range of motion and neck supple.     Right lower leg: No edema.     Left lower leg: No edema.  Lymphadenopathy:     Cervical: No cervical adenopathy.  Skin:    General: Skin is warm.     Coloration: Skin is not jaundiced or pale.     Findings: No bruising, erythema, lesion or rash.  Neurological:     General: No focal deficit present.     Mental Status: He is alert and oriented to person, place, and time.     Cranial Nerves: No cranial nerve deficit.     Sensory: No sensory deficit.     Motor: No weakness.     Coordination: Coordination normal.     Gait: Gait normal.     Deep Tendon Reflexes: Reflexes normal.  Psychiatric:        Mood and Affect: Mood normal.        Behavior: Behavior normal.        Thought Content: Thought content normal.        Judgment: Judgment normal.   Assessment & Plan:  Cervical radiculopathy  Routine general medical examination at a health care facility  Dyslipidemia  Prediabetes Overall lab work looks good except for an elevated fasting blood sugar and a low HDL.  Encouraged increase aerobic exercise as well as weight loss and dietary changes.  Blood pressure is  excellent.  I will give the patient Robaxin 500 mg every 8 hours as needed for muscle stiffness.  If the pain in his neck worsens I would recommend an MRI of the cervical spine.  The remainder of his lab work is excellent.  Encouraged the patient to get a booster on his COVID-vaccine.  Tetanus shot is up-to-date.  Regular anticipatory guidance is provided.  I encouraged him to contact his sleep specialist to discuss possibly switching to auto titrate.

## 2020-11-19 ENCOUNTER — Telehealth (INDEPENDENT_AMBULATORY_CARE_PROVIDER_SITE_OTHER): Payer: 59 | Admitting: Adult Health

## 2020-11-19 DIAGNOSIS — G4733 Obstructive sleep apnea (adult) (pediatric): Secondary | ICD-10-CM | POA: Diagnosis not present

## 2020-11-19 DIAGNOSIS — Z9989 Dependence on other enabling machines and devices: Secondary | ICD-10-CM | POA: Diagnosis not present

## 2020-11-19 NOTE — Progress Notes (Signed)
PATIENT: Rick Maldonado DOB: Aug 01, 1985  REASON FOR VISIT: follow up HISTORY FROM: patient PRIMARY NEUROLOGIST:   Virtual Visit via Video Note  I connected with Rick Maldonado on 11/19/20 at  2:00 PM EDT by a video enabled telemedicine application located remotely at [facility] and verified that I am speaking with the correct person using two identifiers who was located at their own home.   I discussed the limitations of evaluation and management by telemedicine and the availability of in person appointments. The patient expressed understanding and agreed to proceed.   PATIENT: Rick Maldonado DOB: Jul 13, 1985  REASON FOR VISIT: follow up HISTORY FROM: patient  HISTORY OF PRESENT ILLNESS: Today 11/19/20:   Mr. Rieman is a 35 year old male with a history of obstructive sleep apnea on CPAP.  He returns today for virtual visit.  The patient reports that the CPAP continues to work fairly well for him.  He did have back surgery and is now only able to sleep in the supine position.  He reports that his wife notes that he does snore with the machine.  He denies any weight gain.  The patient also reports that he likes to go camping but is unable to use the machine when he does this.     REVIEW OF SYSTEMS: Out of a complete 14 system review of symptoms, the patient complains only of the following symptoms, and all other reviewed systems are negative.  ALLERGIES: Allergies  Allergen Reactions   Morphine And Related Anaphylaxis    03/13/19 Per pt, he was told that he had throat closing to morphine when his tonsils were removed as a child. Has tolerated oxycodone and hydromorphone for various procedures as an adult    Codeine Other (See Comments)    Reaction unknown   Penicillins Other (See Comments)    Reaction unknown Did it involve swelling of the face/tongue/throat, SOB, or low BP? Unknown Did it involve sudden or severe rash/hives, skin peeling, or any  reaction on the inside of your mouth or nose? Unknown Did you need to seek medical attention at a hospital or doctor's office? Unknown When did it last happen? Informed as a child that he was allergic  If all above answers are "NO", may proceed with cephalosporin use.    Sulfa Antibiotics Other (See Comments)    Reaction unknown    HOME MEDICATIONS: Outpatient Medications Prior to Visit  Medication Sig Dispense Refill   methocarbamol (ROBAXIN) 500 MG tablet Take 1 tablet (500 mg total) by mouth every 6 (six) hours as needed for muscle spasms. 30 tablet 0   No facility-administered medications prior to visit.    PAST MEDICAL HISTORY: Past Medical History:  Diagnosis Date   Acid reflux    DDD (degenerative disc disease), lumbosacral    bilateral neural foraminal stenosis at L5-S1, mass effect on B exiting L5 nerve roots (2/20)   Dyslipidemia    Sleep apnea    cpap   Spondylolysis     PAST SURGICAL HISTORY: Past Surgical History:  Procedure Laterality Date   ABDOMINAL EXPOSURE N/A 03/13/2019   Procedure: ABDOMINAL EXPOSURE;  Surgeon: Cephus Shelling, MD;  Location: [facility] OR;  Service: Vascular;  Laterality: N/A;   ANTERIOR LUMBAR FUSION N/A 03/13/2019   Procedure: Lumbar five Sacral one Anterior lumbar interbody fusion with percutaneous posterior instrumentation;  Surgeon: Shirlean Kelly, MD;  Location: [facility] OR;  Service: Neurosurgery;  Laterality: N/A;   APPLICATION OF ROBOTIC ASSISTANCE FOR  SPINAL PROCEDURE N/A 03/13/2019   Procedure: APPLICATION OF ROBOTIC ASSISTANCE FOR SPINAL PROCEDURE;  Surgeon: Shirlean Kelly, MD;  Location[facility] OR;  Service: Neurosurgery;  Laterality: N/A;   LUMBAR PERCUTANEOUS PEDICLE SCREW 1 LEVEL N/A 03/13/2019   Procedure: LUMBAR PERCUTANEOUS PEDICLE SCREW LUMBAR FIVE-SACRAL ONE LEVEL;  Surgeon: Shirlean Kelly, MD;  Location[facility] OR;  Service: Neurosurgery;  Laterality: N/A;   TONSILLECTOMY     as a child    FAMILY HISTORY: Family History   Problem Relation Age of Onset   Other Mother        drug addiction   Arthritis Father     SOCIAL HISTORY: Social History   Socioeconomic History   Marital status: Married    Spouse name: [name]   Number of children: 0   Years of education: BS   Highest education level: Not on file  Occupational History   Occupation: Nature conservation officer    Comment: [employer]  Tobacco Use   Smoking status: Never   Smokeless tobacco: Never  Substance and Sexual Activity   Alcohol use: Yes    Comment: rarely   Drug use: No   Sexual activity: Not on file  Other Topics Concern   Not on file  Social History Narrative   Patient consumes 2 glasses of caffeine daily,is right handed   Social Determinants of Health   Financial Resource Strain: Not on file  Food Insecurity: Not on file  Transportation Needs: Not on file  Physical Activity: Not on file  Stress: Not on file  Social Connections: Not on file  Intimate Partner Violence: Not on file      PHYSICAL EXAM Generalized: Well developed, in no acute distress   Neurological examination  Mentation: Alert oriented to time, place, history taking. Follows all commands speech and language fluent Cranial nerve II-XII:Extraocular movements were full. Facial symmetry noted. uvula tongue midline. Head turning and shoulder shrug  were normal and symmetric.  Reflexes: UTA  DIAGNOSTIC DATA (LABS, IMAGING, TESTING) - I reviewed patient records, labs, notes, testing and imaging myself where available.  Lab Results  Component Value Date   WBC 5.5 08/20/2020   HGB 15.1 08/20/2020   HCT 45.4 08/20/2020   MCV 92.7 08/20/2020   PLT 185 08/20/2020      Component Value Date/Time   NA 139 08/20/2020 0822   K 5.0 08/20/2020 0822   CL 105 08/20/2020 0822   CO2 28 08/20/2020 0822   GLUCOSE 112 (H) 08/20/2020 0822   BUN 14 08/20/2020 0822   CREATININE 1.13 08/20/2020 0822   CALCIUM 9.2 08/20/2020 0822   PROT 6.2 08/20/2020 0822   ALBUMIN 4.3  01/07/2016 0825   AST 20 08/20/2020 0822   ALT 31 08/20/2020 0822   ALKPHOS 43 01/07/2016 0825   BILITOT 0.8 08/20/2020 0822   GFRNONAA 83 08/20/2020 0822   GFRAA 96 08/20/2020 0822   Lab Results  Component Value Date   CHOL 144 08/20/2020   HDL 30 (L) 08/20/2020   LDLCALC 91 08/20/2020   TRIG 131 08/20/2020   CHOLHDL 4.8 08/20/2020   Lab Results  Component Value Date   HGBA1C 5.1 08/20/2020     ASSESSMENT AND PLAN 35 y.o. year old male  has a past medical history of Acid reflux, DDD (degenerative disc disease), lumbosacral, Dyslipidemia, Sleep apnea, and Spondylolysis. here with:  OSA on CPAP  CPAP compliance excellent Residual AHI is good Encouraged patient to continue using CPAP nightly and > 4 hours each night Will increase  pressure to 9 cm of water Patient has an older machine.  Discussed getting the patient a new machine however he plans to check with his insurance first. F/U in 1 year or sooner if needed  I  Butch Penny, MSN, NP-C 11/19/2020, 1:57 PM [facility] [address] [place], [place] [address] [phone]

## 2021-11-18 ENCOUNTER — Encounter: Payer: Self-pay | Admitting: Adult Health

## 2021-11-18 ENCOUNTER — Ambulatory Visit: Payer: 59 | Admitting: Adult Health

## 2021-11-18 VITALS — BP 129/84 | HR 70 | Ht 68.0 in | Wt 201.6 lb

## 2021-11-18 DIAGNOSIS — G4733 Obstructive sleep apnea (adult) (pediatric): Secondary | ICD-10-CM | POA: Diagnosis not present

## 2021-11-18 DIAGNOSIS — Z9989 Dependence on other enabling machines and devices: Secondary | ICD-10-CM | POA: Diagnosis not present

## 2021-11-18 NOTE — Progress Notes (Signed)
PATIENT: Rick Maldonado DOB: 1985-07-22  REASON FOR VISIT: follow up HISTORY FROM: patient PRIMARY NEUROLOGIST:   Virtual Visit via Video Note  I connected with Rick Maldonado on 11/18/21 at 11:30 AM EDT by a video enabled telemedicine application located remotely at [facility] and verified that I am speaking with the correct person using two identifiers who was located at their own home.   I discussed the limitations of evaluation and management by telemedicine and the availability of in person appointments. The patient expressed understanding and agreed to proceed.   PATIENT: Rick Maldonado DOB: 1985/12/08  REASON FOR VISIT: follow up HISTORY FROM: patient  Chief Complaint  Patient presents with   Follow-up    Rm 7, alone.  CPAP.  Snoring while using cpap. More tired since last time. ESS 6, FSS 46.      HISTORY OF PRESENT ILLNESS: Today 11/18/21:  Mr. Samec is a 36 year old male with a history of obstructive sleep apnea on CPAP.  He returns today for follow-up.  He reports that CPAP is working well.  He denies any new issues.  Doesn't use water in the machine. He returns today for evaluation.      11/19/20: Mr. Helmich is a 36 year old male with a history of obstructive sleep apnea on CPAP.  He returns today for virtual visit.  The patient reports that the CPAP continues to work fairly well for him.  He did have back surgery and is now only able to sleep in the supine position.  He reports that his wife notes that he does snore with the machine.  He denies any weight gain.  The patient also reports that he likes to go camping but is unable to use the machine when he does this.     REVIEW OF SYSTEMS: Out of a complete 14 system review of symptoms, the patient complains only of the following symptoms, and all other reviewed systems are negative.  ESS 6  ALLERGIES: Allergies  Allergen Reactions   Morphine And Related Anaphylaxis     03/13/19 Per pt, he was told that he had throat closing to morphine when his tonsils were removed as a child. Has tolerated oxycodone and hydromorphone for various procedures as an adult    Codeine Other (See Comments)    Reaction unknown   Penicillins Other (See Comments)    Reaction unknown Did it involve swelling of the face/tongue/throat, SOB, or low BP? Unknown Did it involve sudden or severe rash/hives, skin peeling, or any reaction on the inside of your mouth or nose? Unknown Did you need to seek medical attention at a hospital or doctor's office? Unknown When did it last happen? Informed as a child that he was allergic  If all above answers are "NO", may proceed with cephalosporin use.    Sulfa Antibiotics Other (See Comments)    Reaction unknown    HOME MEDICATIONS: Outpatient Medications Prior to Visit  Medication Sig Dispense Refill   methocarbamol (ROBAXIN) 500 MG tablet Take 1 tablet (500 mg total) by mouth every 6 (six) hours as needed for muscle spasms. 30 tablet 0   No facility-administered medications prior to visit.    PAST MEDICAL HISTORY: Past Medical History:  Diagnosis Date   Acid reflux    DDD (degenerative disc disease), lumbosacral    bilateral neural foraminal stenosis at L5-S1, mass effect on B exiting L5 nerve roots (2/20)   Dyslipidemia    Sleep apnea  cpap   Spondylolysis     PAST SURGICAL HISTORY: Past Surgical History:  Procedure Laterality Date   ABDOMINAL EXPOSURE N/A 03/13/2019   Procedure: ABDOMINAL EXPOSURE;  Surgeon: Marty Heck, MD;  Location: [place];  Service: Vascular;  Laterality: N/A;   ANTERIOR LUMBAR FUSION N/A 03/13/2019   Procedure: Lumbar five Sacral one Anterior lumbar interbody fusion with percutaneous posterior instrumentation;  Surgeon: Jovita Gamma, MD;  Location: [place];  Service: Neurosurgery;  Laterality: N/A;   APPLICATION OF ROBOTIC ASSISTANCE FOR SPINAL PROCEDURE N/A 03/13/2019   Procedure: APPLICATION OF  ROBOTIC ASSISTANCE FOR SPINAL PROCEDURE;  Surgeon: Jovita Gamma, MD;  Location: [place];  Service: Neurosurgery;  Laterality: N/A;   LUMBAR PERCUTANEOUS PEDICLE SCREW 1 LEVEL N/A 03/13/2019   Procedure: LUMBAR PERCUTANEOUS PEDICLE SCREW LUMBAR FIVE-SACRAL ONE LEVEL;  Surgeon: Jovita Gamma, MD;  Location: [place];  Service: Neurosurgery;  Laterality: N/A;   TONSILLECTOMY     as a child    FAMILY HISTORY: Family History  Problem Relation Age of Onset   Other Mother        drug addiction   Arthritis Father     SOCIAL HISTORY: Social History   Socioeconomic History   Marital status: Married    Spouse name: Belenda Cruise   Number of children: 0   Years of education: BS   Highest education level: Not on file  Occupational History   Occupation: Sales promotion account executive    Comment: [employer]  Tobacco Use   Smoking status: Never   Smokeless tobacco: Never  Substance and Sexual Activity   Alcohol use: Yes    Comment: rarely   Drug use: No   Sexual activity: Not on file  Other Topics Concern   Not on file  Social History Narrative   Patient consumes 2 glasses of caffeine daily,is right handed   Social Determinants of Health   Financial Resource Strain: Not on file  Food Insecurity: Not on file  Transportation Needs: Not on file  Physical Activity: Not on file  Stress: Not on file  Social Connections: Not on file  Intimate Partner Violence: Not on file      PHYSICAL EXAM Generalized: Well developed, in no acute distress   Neurological examination  Mentation: Alert oriented to time, place, history taking. Follows all commands speech and language fluent Cranial nerve II-XII:Extraocular movements were full. Facial symmetry noted. Marland Kitchen Head turning and shoulder shrug  were normal and symmetric.  Reflexes: UTA  DIAGNOSTIC DATA (LABS, IMAGING, TESTING) - I reviewed patient records, labs, notes, testing and imaging myself where available.  Lab Results  Component Value Date   WBC  5.5 08/20/2020   HGB 15.1 08/20/2020   HCT 45.4 08/20/2020   MCV 92.7 08/20/2020   PLT 185 08/20/2020      Component Value Date/Time   NA 139 08/20/2020 0822   K 5.0 08/20/2020 0822   CL 105 08/20/2020 0822   CO2 28 08/20/2020 0822   GLUCOSE 112 (H) 08/20/2020 0822   BUN 14 08/20/2020 0822   CREATININE 1.13 08/20/2020 0822   CALCIUM 9.2 08/20/2020 0822   PROT 6.2 08/20/2020 0822   ALBUMIN 4.3 01/07/2016 0825   AST 20 08/20/2020 0822   ALT 31 08/20/2020 0822   ALKPHOS 43 01/07/2016 0825   BILITOT 0.8 08/20/2020 0822   GFRNONAA 83 08/20/2020 0822   GFRAA 96 08/20/2020 0822   Lab Results  Component Value Date   CHOL 144 08/20/2020   HDL 30 (L) 08/20/2020  [place]win 91 08/20/2020   TRIG 131 08/20/2020   CHOLHDL 4.8 08/20/2020   Lab Results  Component Value Date   HGBA1C 5.1 08/20/2020     ASSESSMENT AND PLAN 36 y.o. year old male  has a past medical history of Acid reflux, DDD (degenerative disc disease), lumbosacral, Dyslipidemia, Sleep apnea, and Spondylolysis. here with:  OSA on CPAP  CPAP compliance excellent Residual AHI is good Encouraged patient to continue using CPAP nightly and > 4 hours each night F/U in 1 year or sooner if needed    Ward Givens, MSN, NP-C 11/18/2021, 11:25 AM [facility] [address] [place], [place] [address] [phone]

## 2021-11-19 NOTE — Progress Notes (Signed)
New, Willodean Rosenthal, RN; Darlina Guys; [facility] [place], Clovis Riley Received, Thank you!      Previous Messages    ----- Message -----  From: Brandon Melnick, RN  Sent: 11/18/2021  12:06 PM EDT  To: Darlina Guys; Miquel Dunn; Stephannie Peters; *  Subject: change pressure to 10cm h20                     Hi   New order in epic   [facility]  Male, 37 y.o., 19-Sep-1985  Pronouns:  he/him/his  MRN:  [id number]    Done, pending orders.    Acupuncturist

## 2021-12-07 ENCOUNTER — Other Ambulatory Visit: Payer: Self-pay | Admitting: Family Medicine

## 2021-12-30 ENCOUNTER — Other Ambulatory Visit: Payer: PRIVATE HEALTH INSURANCE

## 2021-12-30 DIAGNOSIS — R7303 Prediabetes: Secondary | ICD-10-CM

## 2021-12-30 DIAGNOSIS — E785 Hyperlipidemia, unspecified: Secondary | ICD-10-CM

## 2021-12-31 ENCOUNTER — Other Ambulatory Visit: Payer: PRIVATE HEALTH INSURANCE

## 2021-12-31 LAB — CBC WITH DIFFERENTIAL/PLATELET
Absolute Monocytes: 541 cells/uL (ref 200–950)
Basophils Absolute: 33 cells/uL (ref 0–200)
Basophils Relative: 0.5 %
Eosinophils Absolute: 119 cells/uL (ref 15–500)
Eosinophils Relative: 1.8 %
HCT: 45.8 % (ref 38.5–50.0)
Hemoglobin: 15.6 g/dL (ref 13.2–17.1)
Lymphs Abs: 1894 cells/uL (ref 850–3900)
MCH: 31.5 pg (ref 27.0–33.0)
MCHC: 34.1 g/dL (ref 32.0–36.0)
MCV: 92.5 fL (ref 80.0–100.0)
MPV: 10.1 fL (ref 7.5–12.5)
Monocytes Relative: 8.2 %
Neutro Abs: 4013 cells/uL (ref 1500–7800)
Neutrophils Relative %: 60.8 %
Platelets: 184 10*3/uL (ref 140–400)
RBC: 4.95 10*6/uL (ref 4.20–5.80)
RDW: 12.4 % (ref 11.0–15.0)
Total Lymphocyte: 28.7 %
WBC: 6.6 10*3/uL (ref 3.8–10.8)

## 2021-12-31 LAB — COMPLETE METABOLIC PANEL WITH GFR
AG Ratio: 2.4 (calc) (ref 1.0–2.5)
ALT: 26 U/L (ref 9–46)
AST: 15 U/L (ref 10–40)
Albumin: 4.6 g/dL (ref 3.6–5.1)
Alkaline phosphatase (APISO): 53 U/L (ref 36–130)
BUN: 21 mg/dL (ref 7–25)
CO2: 28 mmol/L (ref 20–32)
Calcium: 9.2 mg/dL (ref 8.6–10.3)
Chloride: 107 mmol/L (ref 98–110)
Creat: 1.15 mg/dL (ref 0.60–1.26)
Globulin: 1.9 g/dL (calc) (ref 1.9–3.7)
Glucose, Bld: 100 mg/dL — ABNORMAL HIGH (ref 65–99)
Potassium: 4.4 mmol/L (ref 3.5–5.3)
Sodium: 141 mmol/L (ref 135–146)
Total Bilirubin: 0.9 mg/dL (ref 0.2–1.2)
Total Protein: 6.5 g/dL (ref 6.1–8.1)
eGFR: 84 mL/min/{1.73_m2} (ref >=60)

## 2021-12-31 LAB — LIPID PANEL
Cholesterol: 150 mg/dL (ref <200)
HDL: 33 mg/dL — ABNORMAL LOW (ref >=40)
LDL Cholesterol (Calc): 93 mg/dL (calc)
Non-HDL Cholesterol (Calc): 117 mg/dL (calc) (ref <130)
Total CHOL/HDL Ratio: 4.5 (calc) (ref <5.0)
Triglycerides: 137 mg/dL (ref <150)

## 2021-12-31 LAB — MICROALBUMIN / CREATININE URINE RATIO
Creatinine, Urine: 179 mg/dL (ref 20–320)
Microalb Creat Ratio: 3 mcg/mg creat (ref <30)
Microalb, Ur: 0.5 mg/dL

## 2021-12-31 LAB — HEMOGLOBIN A1C
Hgb A1c MFr Bld: 5.5 % of total Hgb (ref <5.7)
Mean Plasma Glucose: 111 mg/dL
eAG (mmol/L): 6.2 mmol/L

## 2022-01-05 ENCOUNTER — Ambulatory Visit (INDEPENDENT_AMBULATORY_CARE_PROVIDER_SITE_OTHER): Payer: PRIVATE HEALTH INSURANCE | Admitting: Family Medicine

## 2022-01-05 VITALS — BP 120/62 | HR 64 | Ht 68.0 in | Wt 202.0 lb

## 2022-01-05 DIAGNOSIS — Z0000 Encounter for general adult medical examination without abnormal findings: Secondary | ICD-10-CM | POA: Diagnosis not present

## 2022-01-05 DIAGNOSIS — E785 Hyperlipidemia, unspecified: Secondary | ICD-10-CM | POA: Diagnosis not present

## 2022-01-05 NOTE — Progress Notes (Signed)
Subjective:    Patient ID: Rick Maldonado, male    DOB: [birth year], 36 y.o.   MRN: [id number]  HPI Patient is a 36 year old Caucasian gentleman here today for complete physical exam.  He has a history of an L5-S1 lumbar fusion in 2021.  He still deals with chronic low back pain related to this.  However he is able to manage the pain by working from home.Marland Kitchen  He has to stand at a desk for work.  He is unable to wear dress belts or dress shoes.  If he is able to wear stretchy pants, it makes his back feel better than the restrictive hard leather dress belts.  He also needs to take frequent stretching breaks and walk breaks to help relieve the muscle spasms in his back.  It hurts him to drive or sit for prolonged periods of time.  Currently he is working from home and is able to accommodate necessities.  He does occasionally get some pain in his neck radiating into his right shoulder and into his right chest that he attributes to muscular pain from his neck.  Otherwise he is doing well.  He declines a flu shot.  He declines a COVID booster having recently had COVID Lab on 12/30/2021  Component Date Value Ref Range Status   WBC 12/30/2021 6.6  3.8 - 10.8 Thousand/uL Final   RBC 12/30/2021 4.95  4.20 - 5.80 Million/uL Final   Hemoglobin 12/30/2021 15.6  13.2 - 17.1 g/dL Final   HCT 12/30/2021 45.8  38.5 - 50.0 % Final   MCV 12/30/2021 92.5  80.0 - 100.0 fL Final   MCH 12/30/2021 31.5  27.0 - 33.0 pg Final   MCHC 12/30/2021 34.1  32.0 - 36.0 g/dL Final   RDW 12/30/2021 12.4  11.0 - 15.0 % Final   Platelets 12/30/2021 184  140 - 400 Thousand/uL Final   MPV 12/30/2021 10.1  7.5 - 12.5 fL Final   Neutro Abs 12/30/2021 4,013  1,500 - 7,800 cells/uL Final   Lymphs Abs 12/30/2021 1,894  850 - 3,900 cells/uL Final   Absolute Monocytes 12/30/2021 541  200 - 950 cells/uL Final   Eosinophils Absolute 12/30/2021 119  15 - 500 cells/uL Final   Basophils Absolute 12/30/2021 33  0 - 200 cells/uL Final    Neutrophils Relative % 12/30/2021 60.8  % Final   Total Lymphocyte 12/30/2021 28.7  % Final   Monocytes Relative 12/30/2021 8.2  % Final   Eosinophils Relative 12/30/2021 1.8  % Final   Basophils Relative 12/30/2021 0.5  % Final   Glucose, Bld 12/30/2021 100 (H)  65 - 99 mg/dL Final   Comment: .            Fasting reference interval . For someone without known diabetes, a glucose value between 100 and 125 mg/dL is consistent with prediabetes and should be confirmed with a follow-up test. .    BUN 12/30/2021 21  7 - 25 mg/dL Final   Creat 12/30/2021 1.15  0.60 - 1.26 mg/dL Final   eGFR 12/30/2021 84  > OR = 60 mL/min/1.33m Final   BUN/Creatinine Ratio 12/30/2021 SEE NOTE:  6 - 22 (calc) Final   Comment:    Not Reported: BUN and Creatinine are within    reference range. .    Sodium 12/30/2021 141  135 - 146 mmol/L Final   Potassium 12/30/2021 4.4  3.5 - 5.3 mmol/L Final   Chloride 12/30/2021 107  98 - 110 mmol/L Final  CO2 12/30/2021 28  20 - 32 mmol/L Final   Calcium 12/30/2021 9.2  8.6 - 10.3 mg/dL Final   Total Protein 12/30/2021 6.5  6.1 - 8.1 g/dL Final   Albumin 12/30/2021 4.6  3.6 - 5.1 g/dL Final   Globulin 12/30/2021 1.9  1.9 - 3.7 g/dL (calc) Final   AG Ratio 12/30/2021 2.4  1.0 - 2.5 (calc) Final   Total Bilirubin 12/30/2021 0.9  0.2 - 1.2 mg/dL Final   Alkaline phosphatase (APISO) 12/30/2021 53  36 - 130 U/L Final   AST 12/30/2021 15  10 - 40 U/L Final   ALT 12/30/2021 26  9 - 46 U/L Final   Hgb A1c MFr Bld 12/30/2021 5.5  <5.7 % of total Hgb Final   Comment: For the purpose of screening for the presence of diabetes: . <5.7%       Consistent with the absence of diabetes 5.7-6.4%    Consistent with increased risk for diabetes             (prediabetes) > or =6.5%  Consistent with diabetes . This assay result is consistent with a decreased risk of diabetes. . Currently, no consensus exists regarding use of hemoglobin A1c for diagnosis of diabetes in  children. . According to American Diabetes Association (ADA) guidelines, hemoglobin A1c <7.0% represents optimal control in non-pregnant diabetic patients. Different metrics may apply to specific patient populations.  Standards of Medical Care in Diabetes(ADA). .    Mean Plasma Glucose 12/30/2021 111  mg/dL Final   eAG (mmol/L) 12/30/2021 6.2  mmol/L Final   Cholesterol 12/30/2021 150  <200 mg/dL Final   HDL 12/30/2021 33 (L)  > OR = 40 mg/dL Final   Triglycerides 12/30/2021 137  <150 mg/dL Final   LDL Cholesterol (Calc) 12/30/2021 93  mg/dL (calc) Final   Comment: Reference range: <100 . Desirable range <100 mg/dL for primary prevention;   <70 mg/dL for patients with CHD or diabetic patients  with > or = 2 CHD risk factors. Marland Kitchen LDL-C is now calculated using the Martin-Hopkins  calculation, which is a validated novel method providing  better accuracy than the Friedewald equation in the  estimation of LDL-C.  Cresenciano Genre et al. Annamaria Helling. 3235;573(22): 2061-2068  (http://education.[facility].com/faq/FAQ164)    Total CHOL/HDL Ratio 12/30/2021 4.5  <5.0 (calc) Final   Non-HDL Cholesterol (Calc) 12/30/2021 117  <130 mg/dL (calc) Final   Comment: For patients with diabetes plus 1 major ASCVD risk  factor, treating to a non-HDL-C goal of <100 mg/dL  (LDL-C of <70 mg/dL) is considered a therapeutic  option.    Creatinine, Urine 12/30/2021 179  20 - 320 mg/dL Final   Microalb, Ur 12/30/2021 0.5  mg/dL Final   Comment: Reference Range Not established    Microalb Creat Ratio 12/30/2021 3  <30 mcg/mg creat Final   Comment: . The ADA defines abnormalities in albumin excretion as follows: Marland Kitchen Albuminuria Category        Result (mcg/mg creatinine) . Normal to Mildly increased   <30 Moderately increased         30-299  Severely increased           > OR = 300 . The ADA recommends that at least two of three specimens collected within a 3-6 month period be abnormal before considering a  patient to be within a diagnostic category.     Past Medical History:  Diagnosis Date   Acid reflux    DDD (degenerative disc disease), lumbosacral  bilateral neural foraminal stenosis at L5-S1, mass effect on B exiting L5 nerve roots (2/20)   Dyslipidemia    Sleep apnea    cpap   Spondylolysis    Past Surgical History:  Procedure Laterality Date   ABDOMINAL EXPOSURE N/A 03/13/2019   Procedure: ABDOMINAL EXPOSURE;  Surgeon: Marty Heck, MD;  Location: [place];  Service: Vascular;  Laterality: N/A;   ANTERIOR LUMBAR FUSION N/A 03/13/2019   Procedure: Lumbar five Sacral one Anterior lumbar interbody fusion with percutaneous posterior instrumentation;  Surgeon: Jovita Gamma, MD;  Location: [place];  Service: Neurosurgery;  Laterality: N/A;   APPLICATION OF ROBOTIC ASSISTANCE FOR SPINAL PROCEDURE N/A 03/13/2019   Procedure: APPLICATION OF ROBOTIC ASSISTANCE FOR SPINAL PROCEDURE;  Surgeon: Jovita Gamma, MD;  Location: [place];  Service: Neurosurgery;  Laterality: N/A;   LUMBAR PERCUTANEOUS PEDICLE SCREW 1 LEVEL N/A 03/13/2019   Procedure: LUMBAR PERCUTANEOUS PEDICLE SCREW LUMBAR FIVE-SACRAL ONE LEVEL;  Surgeon: Jovita Gamma, MD;  Location: [facility];  Service: Neurosurgery;  Laterality: N/A;   TONSILLECTOMY     as a child   Current Outpatient Medications on File Prior to Visit  Medication Sig Dispense Refill   gabapentin (NEURONTIN) 300 MG capsule TAKE 1 CAPSULE BY MOUTH 3 TIMES A DAY AS NEEDED FOR NERVE PAIN 90 capsule 3   No current facility-administered medications on file prior to visit.   Allergies  Allergen Reactions   Morphine And Related Anaphylaxis    03/13/19 Per pt, he was told that he had throat closing to morphine when his tonsils were removed as a child. Has tolerated oxycodone and hydromorphone for various procedures as an adult    Codeine Other (See Comments)    Reaction unknown   Penicillins Other (See Comments)    Reaction unknown Did it involve swelling  of the face/tongue/throat, SOB, or low BP? Unknown Did it involve sudden or severe rash/hives, skin peeling, or any reaction on the inside of your mouth or nose? Unknown Did you need to seek medical attention at a hospital or doctor's office? Unknown When did it last happen? Informed as a child that he was allergic  If all above answers are "NO", may proceed with cephalosporin use.    Sulfa Antibiotics Other (See Comments)    Reaction unknown   Social History   Socioeconomic History   Marital status: Married    Spouse name: Belenda Cruise   Number of children: 0   Years of education: BS   Highest education level: Not on file  Occupational History   Occupation: Sales promotion account executive    Comment: [employer]  Tobacco Use   Smoking status: Never   Smokeless tobacco: Never  Substance and Sexual Activity   Alcohol use: Yes    Comment: rarely   Drug use: No   Sexual activity: Not on file  Other Topics Concern   Not on file  Social History Narrative   Patient consumes 2 glasses of caffeine daily,is right handed   Social Determinants of Health   Financial Resource Strain: Not on file  Food Insecurity: Not on file  Transportation Needs: Not on file  Physical Activity: Not on file  Stress: Not on file  Social Connections: Not on file  Intimate Partner Violence: Not on file     Review of Systems     Objective:   Physical Exam Vitals reviewed.  Constitutional:      General: He is not in acute distress.    Appearance: Normal appearance. He is well-developed  and normal weight. He is not ill-appearing, toxic-appearing or diaphoretic.  HENT:     Head: Normocephalic and atraumatic.     Right Ear: Tympanic membrane and ear canal normal. There is no impacted cerumen.     Left Ear: Tympanic membrane and ear canal normal. There is no impacted cerumen.     Nose: Nose normal. No congestion or rhinorrhea.     Mouth/Throat:     Mouth: Mucous membranes are moist.     Pharynx: No oropharyngeal  exudate or posterior oropharyngeal erythema.  Eyes:     General: No scleral icterus.       Right eye: No discharge.        Left eye: No discharge.     Extraocular Movements: Extraocular movements intact.     Conjunctiva/sclera: Conjunctivae normal.     Pupils: Pupils are equal, round, and reactive to light.  Neck:     Vascular: No carotid bruit.  Cardiovascular:     Rate and Rhythm: Normal rate and regular rhythm.     Pulses: Normal pulses.     Heart sounds: Normal heart sounds. No murmur heard.    No friction rub. No gallop.  Pulmonary:     Effort: Pulmonary effort is normal. No respiratory distress.     Breath sounds: Normal breath sounds. No stridor. No wheezing, rhonchi or rales.  Chest:     Chest wall: No tenderness.  Abdominal:     General: Abdomen is flat. Bowel sounds are normal. There is no distension.     Palpations: Abdomen is soft. There is no mass.     Tenderness: There is no abdominal tenderness. There is no guarding or rebound.     Hernia: No hernia is present.  Musculoskeletal:     Cervical back: Normal range of motion and neck supple. No muscular tenderness.     Right lower leg: No edema.     Left lower leg: No edema.  Lymphadenopathy:     Cervical: No cervical adenopathy.  Skin:    General: Skin is warm.     Coloration: Skin is not jaundiced or pale.     Findings: No bruising, erythema, lesion or rash.  Neurological:     General: No focal deficit present.     Mental Status: He is alert and oriented to person, place, and time.     Cranial Nerves: No cranial nerve deficit.     Sensory: No sensory deficit.     Motor: No weakness.     Coordination: Coordination normal.     Gait: Gait normal.     Deep Tendon Reflexes: Reflexes normal.  Psychiatric:        Mood and Affect: Mood normal.        Behavior: Behavior normal.        Thought Content: Thought content normal.        Judgment: Judgment normal.    Assessment & Plan:  Routine general medical  examination at a health care facility  Dyslipidemia Patient's blood sugar today is much better.  He has discontinued soda and this has had a positive impact on his blood sugar.  His HDL remains low and I encouraged him to try to get more aerobic exercise.  However the rest of his blood work is outstanding.  He declines a flu shot and a COVID booster today.  The remainder of his preventative care is up-to-date.  I completed his form for work.  He is requesting to be allowed to work  from home as he is able to accommodate his chronic back pain better while working at his home office versus working at his cubicle at work

## 2022-05-01 ENCOUNTER — Encounter: Payer: Self-pay | Admitting: Family Medicine

## 2022-05-01 ENCOUNTER — Ambulatory Visit: Payer: 59 | Admitting: Family Medicine

## 2022-05-01 VITALS — BP 132/78 | HR 60 | Temp 98.4°F | Ht 68.0 in

## 2022-05-01 DIAGNOSIS — R42 Dizziness and giddiness: Secondary | ICD-10-CM

## 2022-05-01 MED ORDER — MECLIZINE HCL 25 MG PO TABS: 25.0000 mg | ORAL_TABLET | Freq: Three times a day (TID) | ORAL | 0 refills | Status: DC | PRN

## 2022-05-01 NOTE — Progress Notes (Signed)
Subjective:    Patient ID: Rick Maldonado, male    DOB: 1986-01-03, 37 y.o.   MRN: [id number]  Dizziness    3 days ago, the patient suddenly experienced vertigo while sitting at his desk at work.  The room started spinning for no reason.  Yesterday he was doing fine however this morning when he sat up to get out of bed, he started feeling nauseated and the room began to spin again.  He feels like he is carsick.  He denies any headache.  He denies any vision changes.  He denies any hearing loss or tinnitus.  He denies any neurologic deficits.  He denies any head injury. Past Medical History:  Diagnosis Date   Acid reflux    DDD (degenerative disc disease), lumbosacral    bilateral neural foraminal stenosis at L5-S1, mass effect on B exiting L5 nerve roots (2/20)   Dyslipidemia    Sleep apnea    cpap   Spondylolysis    Past Surgical History:  Procedure Laterality Date   ABDOMINAL EXPOSURE N/A 03/13/2019   Procedure: ABDOMINAL EXPOSURE;  Surgeon: Marty Heck, MD;  Location: [facility];  Service: Vascular;  Laterality: N/A;   ANTERIOR LUMBAR FUSION N/A 03/13/2019   Procedure: Lumbar five Sacral one Anterior lumbar interbody fusion with percutaneous posterior instrumentation;  Surgeon: Jovita Gamma, MD;  Location: [facility];  Service: Neurosurgery;  Laterality: N/A;   APPLICATION OF ROBOTIC ASSISTANCE FOR SPINAL PROCEDURE N/A 03/13/2019   Procedure: APPLICATION OF ROBOTIC ASSISTANCE FOR SPINAL PROCEDURE;  Surgeon: Jovita Gamma, MD;  Location: [facility];  Service: Neurosurgery;  Laterality: N/A;   LUMBAR PERCUTANEOUS PEDICLE SCREW 1 LEVEL N/A 03/13/2019   Procedure: LUMBAR PERCUTANEOUS PEDICLE SCREW LUMBAR FIVE-SACRAL ONE LEVEL;  Surgeon: Jovita Gamma, MD;  Location: [facility];  Service: Neurosurgery;  Laterality: N/A;   TONSILLECTOMY     as a child   Current Outpatient Medications on File Prior to Visit  Medication Sig Dispense Refill   gabapentin (NEURONTIN) 300 MG capsule TAKE 1  CAPSULE BY MOUTH 3 TIMES A DAY AS NEEDED FOR NERVE PAIN 90 capsule 3   No current facility-administered medications on file prior to visit.   Allergies  Allergen Reactions   Morphine And Related Anaphylaxis    03/13/19 Per pt, he was told that he had throat closing to morphine when his tonsils were removed as a child. Has tolerated oxycodone and hydromorphone for various procedures as an adult    Codeine Other (See Comments)    Reaction unknown   Penicillins Other (See Comments)    Reaction unknown Did it involve swelling of the face/tongue/throat, SOB, or low BP? Unknown Did it involve sudden or severe rash/hives, skin peeling, or any reaction on the inside of your mouth or nose? Unknown Did you need to seek medical attention at a hospital or doctor's office? Unknown When did it last happen? Informed as a child that he was allergic  If all above answers are "NO", may proceed with cephalosporin use.    Sulfa Antibiotics Other (See Comments)    Reaction unknown   Social History   Socioeconomic History   Marital status: Married    Spouse name: Belenda Cruise   Number of children: 0   Years of education: BS   Highest education level: Not on file  Occupational History   Occupation: Sales promotion account executive    Comment: [employer]  Tobacco Use   Smoking status: Never   Smokeless tobacco: Never  Substance and Sexual  Activity   Alcohol use: Yes    Comment: rarely   Drug use: No   Sexual activity: Not on file  Other Topics Concern   Not on file  Social History Narrative   Patient consumes 2 glasses of caffeine daily,is right handed   Social Determinants of Health   Financial Resource Strain: Not on file  Food Insecurity: Not on file  Transportation Needs: Not on file  Physical Activity: Not on file  Stress: Not on file  Social Connections: Not on file  Intimate Partner Violence: Not on file      Review of Systems  Neurological:  Positive for dizziness.  All other systems reviewed  and are negative.      Objective:   Physical Exam Vitals reviewed.  Constitutional:      General: He is not in acute distress.    Appearance: Normal appearance. He is normal weight. He is not ill-appearing or toxic-appearing.  HENT:     Right Ear: Tympanic membrane and ear canal normal.     Left Ear: Tympanic membrane and ear canal normal.     Nose: Nose normal. No congestion or rhinorrhea.     Mouth/Throat:     Mouth: Mucous membranes are moist.     Pharynx: Oropharynx is clear. No oropharyngeal exudate or posterior oropharyngeal erythema.  Cardiovascular:     Rate and Rhythm: Normal rate and regular rhythm.     Heart sounds: Normal heart sounds.  Pulmonary:     Effort: Pulmonary effort is normal.     Breath sounds: Normal breath sounds.  Neurological:     General: No focal deficit present.     Mental Status: He is alert and oriented to person, place, and time. Mental status is at baseline.     Cranial Nerves: No cranial nerve deficit.           Assessment & Plan:  Vertigo - Plan: CBC with Differential/Platelet, BASIC METABOLIC PANEL WITH GFR Symptoms sound consistent with BPPV.  Begin meclizine 25 mg every 8 hours as needed and allow tincture of time.  Anticipate that this will gradually improve over the next 2 weeks.  Consider Epley maneuvers with persistent.  Recheck immediately if symptoms change

## 2022-07-17 ENCOUNTER — Encounter: Payer: Self-pay | Admitting: Family Medicine

## 2022-07-17 ENCOUNTER — Ambulatory Visit: Payer: Managed Care, Other (non HMO) | Admitting: Family Medicine

## 2022-07-17 VITALS — HR 102 | Temp 99.7°F | Ht 68.0 in | Wt 202.2 lb

## 2022-07-17 DIAGNOSIS — J029 Acute pharyngitis, unspecified: Secondary | ICD-10-CM

## 2022-07-17 MED ORDER — DOXYCYCLINE HYCLATE 100 MG PO TABS: 100.0000 mg | ORAL_TABLET | Freq: Two times a day (BID) | ORAL | 0 refills | Status: DC

## 2022-07-17 NOTE — Progress Notes (Signed)
Subjective:    Patient ID: Rick Maldonado, male    DOB: 05-Jul-1985, 37 y.o.   MRN: [id number]   Patient has been bitten by several ticks recently.  Starting Sunday he developed a fever to 103.  Monday the fever was again 103.  He now reports diffuse bodyaches.  His fever has been consistently 99-100 since.  He does have a sore throat and he does have a persistent cough.  However his children have been battling some type of upper respiratory infection recently.  There is no visible rash.  He denies any chest pain or shortness of breath.  He denies any sinus pain.  He denies any nausea or vomiting.  Past Medical History:  Diagnosis Date   Acid reflux    DDD (degenerative disc disease), lumbosacral    bilateral neural foraminal stenosis at L5-S1, mass effect on B exiting L5 nerve roots (2/20)   Dyslipidemia    Sleep apnea    cpap   Spondylolysis    Past Surgical History:  Procedure Laterality Date   ABDOMINAL EXPOSURE N/A 03/13/2019   Procedure: ABDOMINAL EXPOSURE;  Surgeon: Cephus Shelling, MD;  Location: [facility] OR;  Service: Vascular;  Laterality: N/A;   ANTERIOR LUMBAR FUSION N/A 03/13/2019   Procedure: Lumbar five Sacral one Anterior lumbar interbody fusion with percutaneous posterior instrumentation;  Surgeon: Shirlean Kelly, MD;  Location: [facility] OR;  Service: Neurosurgery;  Laterality: N/A;   APPLICATION OF ROBOTIC ASSISTANCE FOR SPINAL PROCEDURE N/A 03/13/2019   Procedure: APPLICATION OF ROBOTIC ASSISTANCE FOR SPINAL PROCEDURE;  Surgeon: Shirlean Kelly, MD;  Location: [facility] OR;  Service: Neurosurgery;  Laterality: N/A;   LUMBAR PERCUTANEOUS PEDICLE SCREW 1 LEVEL N/A 03/13/2019   Procedure: LUMBAR PERCUTANEOUS PEDICLE SCREW LUMBAR FIVE-SACRAL ONE LEVEL;  Surgeon: Shirlean Kelly, MD;  Location: [facility] OR;  Service: Neurosurgery;  Laterality: N/A;   TONSILLECTOMY     as a child   Current Outpatient Medications on File Prior to Visit  Medication Sig Dispense Refill   gabapentin  (NEURONTIN) 300 MG capsule TAKE 1 CAPSULE BY MOUTH 3 TIMES A DAY AS NEEDED FOR NERVE PAIN 90 capsule 3   meclizine (ANTIVERT) 25 MG tablet Take 1 tablet (25 mg total) by mouth 3 (three) times daily as needed for dizziness. 30 tablet 0   No current facility-administered medications on file prior to visit.   Allergies  Allergen Reactions   Morphine And Codeine Anaphylaxis    03/13/19 Per pt, he was told that he had throat closing to morphine when his tonsils were removed as a child. Has tolerated oxycodone and hydromorphone for various procedures as an adult    Codeine Other (See Comments)    Reaction unknown   Penicillins Other (See Comments)    Reaction unknown Did it involve swelling of the face/tongue/throat, SOB, or low BP? Unknown Did it involve sudden or severe rash/hives, skin peeling, or any reaction on the inside of your mouth or nose? Unknown Did you need to seek medical attention at a hospital or doctor's office? Unknown When did it last happen? Informed as a child that he was allergic  If all above answers are "NO", may proceed with cephalosporin use.    Sulfa Antibiotics Other (See Comments)    Reaction unknown   Social History   Socioeconomic History   Marital status: Married    Spouse name: [name]   Number of children: 0   Years of education: BS   Highest education level: Not on file  Occupational  History   Occupation: Nature conservation officer    Comment: [employer]  Tobacco Use   Smoking status: Never   Smokeless tobacco: Never  Substance and Sexual Activity   Alcohol use: Yes    Comment: rarely   Drug use: No   Sexual activity: Not on file  Other Topics Concern   Not on file  Social History Narrative   Patient consumes 2 glasses of caffeine daily,is right handed   Social Determinants of Health   Financial Resource Strain: Not on file  Food Insecurity: Not on file  Transportation Needs: Not on file  Physical Activity: Not on file  Stress: Not on file   Social Connections: Not on file  Intimate Partner Violence: Not on file      Review of Systems  Constitutional:  Positive for fever.  Neurological:  Positive for dizziness.  All other systems reviewed and are negative.      Objective:   Physical Exam Vitals reviewed.  Constitutional:      General: He is not in acute distress.    Appearance: Normal appearance. He is normal weight. He is not ill-appearing or toxic-appearing.  HENT:     Right Ear: Ear canal normal. Tympanic membrane is erythematous.     Left Ear: Ear canal normal. Tympanic membrane is erythematous.     Nose: Nose normal. No congestion or rhinorrhea.     Mouth/Throat:     Mouth: Mucous membranes are moist.     Pharynx: Oropharynx is clear. No oropharyngeal exudate or posterior oropharyngeal erythema.  Cardiovascular:     Rate and Rhythm: Normal rate and regular rhythm.     Heart sounds: Normal heart sounds.  Pulmonary:     Effort: Pulmonary effort is normal.     Breath sounds: Normal breath sounds.  Neurological:     General: No focal deficit present.     Mental Status: He is alert and oriented to person, place, and time. Mental status is at baseline.     Cranial Nerves: No cranial nerve deficit.           Assessment & Plan:  Sore throat - Plan: STREP GROUP A AG, W/REFLEX TO CULT I believe the patient most likely has a virus.  I believe he is likely sharing the virus that his children have.  Strep test is negative.  However given recent tick bites and the severely high fever which is persistent, I will cover the patient for possible tickborne illness with doxycycline 100 mg twice daily for 10 days.  Recommended supportive care for the cough with Mucinex DM and Tylenol/Motrin for fever.  Recommend the patient perform a home COVID test as well

## 2022-07-19 LAB — CULTURE, GROUP A STREP
MICRO NUMBER:: 14971381
SPECIMEN QUALITY:: ADEQUATE

## 2022-07-19 LAB — STREP GROUP A AG, W/REFLEX TO CULT: Streptococcus Group A AG: NOT DETECTED

## 2022-10-02 ENCOUNTER — Other Ambulatory Visit: Payer: Self-pay

## 2022-10-02 DIAGNOSIS — E785 Hyperlipidemia, unspecified: Secondary | ICD-10-CM

## 2022-10-02 DIAGNOSIS — R7303 Prediabetes: Secondary | ICD-10-CM

## 2022-10-02 LAB — CBC WITH DIFFERENTIAL/PLATELET
Absolute Monocytes: 1030 cells/uL — ABNORMAL HIGH (ref 200–950)
Basophils Absolute: 38 cells/uL (ref 0–200)
Basophils Relative: 0.6 %
Eosinophils Absolute: 122 cells/uL (ref 15–500)
Eosinophils Relative: 1.9 %
HCT: 47.5 % (ref 38.5–50.0)
Hemoglobin: 16.1 g/dL (ref 13.2–17.1)
Lymphs Abs: 1613 cells/uL (ref 850–3900)
MCH: 31.1 pg (ref 27.0–33.0)
MCHC: 33.9 g/dL (ref 32.0–36.0)
MCV: 91.7 fL (ref 80.0–100.0)
MPV: 10.5 fL (ref 7.5–12.5)
Monocytes Relative: 16.1 %
Neutro Abs: 3597 cells/uL (ref 1500–7800)
Neutrophils Relative %: 56.2 %
Platelets: 167 10*3/uL (ref 140–400)
RBC: 5.18 10*6/uL (ref 4.20–5.80)
RDW: 12.9 % (ref 11.0–15.0)
Total Lymphocyte: 25.2 %
WBC: 6.4 10*3/uL (ref 3.8–10.8)

## 2022-10-05 ENCOUNTER — Ambulatory Visit (INDEPENDENT_AMBULATORY_CARE_PROVIDER_SITE_OTHER): Payer: Managed Care, Other (non HMO) | Admitting: Family Medicine

## 2022-10-05 ENCOUNTER — Encounter: Payer: Self-pay | Admitting: Family Medicine

## 2022-10-05 VITALS — BP 118/62 | HR 53 | Temp 98.1°F | Ht 68.0 in | Wt 207.4 lb

## 2022-10-05 DIAGNOSIS — Z0001 Encounter for general adult medical examination with abnormal findings: Secondary | ICD-10-CM | POA: Diagnosis not present

## 2022-10-05 DIAGNOSIS — M542 Cervicalgia: Secondary | ICD-10-CM

## 2022-10-05 DIAGNOSIS — M5412 Radiculopathy, cervical region: Secondary | ICD-10-CM | POA: Diagnosis not present

## 2022-10-05 DIAGNOSIS — Z0000 Encounter for general adult medical examination without abnormal findings: Secondary | ICD-10-CM

## 2022-10-05 DIAGNOSIS — E785 Hyperlipidemia, unspecified: Secondary | ICD-10-CM

## 2022-10-05 MED ORDER — CYCLOBENZAPRINE HCL 10 MG PO TABS: 10.0000 mg | ORAL_TABLET | Freq: Three times a day (TID) | ORAL | 0 refills | Status: DC | PRN

## 2022-10-05 NOTE — Progress Notes (Signed)
Subjective:    Patient ID: Rick Maldonado, male    DOB: [birth year], 37 y.o.   MRN: [id number]  HPI Patient is a 37 year old Caucasian gentleman here today for complete physical exam.  He has a history of an L5-S1 lumbar fusion in 2021.  He still deals with chronic low back pain related to this.  He also has been dealing with chronic neck pain.  He had an x-ray of the cervical spine in 2022 that showed no degenerative disc disease or arthritic changes.  Disc spaces were preserved.  However the patient has pain radiating out the left side of his neck into his left shoulder and down his left arm into his left hand almost on a daily basis.  This is been happening for the last 2 years.  The pain is getting worse.  He now reports tingling in his thumb, forefinger, and fifth finger on both hands.  Positions do not improve it.  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  Furthermore he reports pain in the center of his neck and into his left shoulder blade as well.  He has tried stretching and physical therapy and gabapentin without benefit.  He has been taking ibuprofen without benefit.  This has been going on now for 2 years but especially over the last 3 months. Lab on 10/02/2022  Component Date Value Ref Range Status   WBC 10/02/2022 6.4  3.8 - 10.8 Thousand/uL Final   RBC 10/02/2022 5.18  4.20 - 5.80 Million/uL Final   Hemoglobin 10/02/2022 16.1  13.2 - 17.1 g/dL Final   HCT 38/75/6433 47.5  38.5 - 50.0 % Final   MCV 10/02/2022 91.7  80.0 - 100.0 fL Final   MCH 10/02/2022 31.1  27.0 - 33.0 pg Final   MCHC 10/02/2022 33.9  32.0 - 36.0 g/dL Final   RDW 29/51/8841 12.9  11.0 - 15.0 % Final   Platelets 10/02/2022 167  140 - 400 Thousand/uL Final   MPV 10/02/2022 10.5  7.5 - 12.5 fL Final   Neutro Abs 10/02/2022 3,597  1,500 - 7,800 cells/uL Final   Lymphs Abs 10/02/2022 1,613  850 - 3,900 cells/uL Final   Absolute Monocytes  10/02/2022 1,030 (H)  200 - 950 cells/uL Final   Eosinophils Absolute 10/02/2022 122  15 - 500 cells/uL Final   Basophils Absolute 10/02/2022 38  0 - 200 cells/uL Final   Neutrophils Relative % 10/02/2022 56.2  % Final   Total Lymphocyte 10/02/2022 25.2  % Final   Monocytes Relative 10/02/2022 16.1  % Final   Eosinophils Relative 10/02/2022 1.9  % Final   Basophils Relative 10/02/2022 0.6  % Final   Glucose, Bld 10/02/2022 136 (H)  65 - 99 mg/dL Final   Comment: .            Fasting reference interval . For someone without known diabetes, a glucose value >125 mg/dL indicates that they may have diabetes and this should be confirmed with a follow-up test. .    BUN 10/02/2022 20  7 - 25 mg/dL Final   Creat 66/08/3014 1.27 (H)  0.60 - 1.26 mg/dL Final   eGFR 03/10/3233 74  > OR = 60 mL/min/1.97m2 Final   BUN/Creatinine Ratio 10/02/2022 16  6 - 22 (calc) Final   Sodium 10/02/2022 138  135 - 146 mmol/L Final   Potassium 10/02/2022 4.4  3.5 - 5.3 mmol/L Final  Chloride 10/02/2022 103  98 - 110 mmol/L Final   CO2 10/02/2022 30  20 - 32 mmol/L Final   Calcium 10/02/2022 9.7  8.6 - 10.3 mg/dL Final   Total Protein 62/95/2841 6.6  6.1 - 8.1 g/dL Final   Albumin 32/44/0102 4.6  3.6 - 5.1 g/dL Final   Globulin 72/53/6644 2.0  1.9 - 3.7 g/dL (calc) Final   AG Ratio 10/02/2022 2.3  1.0 - 2.5 (calc) Final   Total Bilirubin 10/02/2022 0.9  0.2 - 1.2 mg/dL Final   Alkaline phosphatase (APISO) 10/02/2022 57  36 - 130 U/L Final   AST 10/02/2022 22  10 - 40 U/L Final   ALT 10/02/2022 33  9 - 46 U/L Final   Hgb A1c MFr Bld 10/02/2022 5.5  <5.7 % of total Hgb Final   Comment: For the purpose of screening for the presence of diabetes: . <5.7%       Consistent with the absence of diabetes 5.7-6.4%    Consistent with increased risk for diabetes             (prediabetes) > or =6.5%  Consistent with diabetes . This assay result is consistent with a decreased risk of diabetes. . Currently, no  consensus exists regarding use of hemoglobin A1c for diagnosis of diabetes in children. . According to American Diabetes Association (ADA) guidelines, hemoglobin A1c <7.0% represents optimal control in non-pregnant diabetic patients. Different metrics may apply to specific patient populations.  Standards of Medical Care in Diabetes(ADA). .    Mean Plasma Glucose 10/02/2022 111  mg/dL Final   eAG (mmol/L) 03/47/4259 6.2  mmol/L Final   Comment: . This test was performed on the Roche cobas c503 platform. Effective 12/08/21, a change in test platforms from the Abbott Architect to the Roche cobas c503 may have shifted HbA1c results compared to historical results. Based on laboratory validation testing conducted at [facility], the Roche platform relative to the Abbott platform had an average increase in HbA1c value of < or = 0.3%. This difference is within accepted  variability established by the [facility]. Note that not all individuals will have had a shift in their results and direct comparisons between historical and current results for testing conducted on different platforms is not recommended.    Cholesterol 10/02/2022 145  <200 mg/dL Final   HDL 56/38/7564 32 (L)  > OR = 40 mg/dL Final   Triglycerides 33/29/5188 144  <150 mg/dL Final   LDL Cholesterol (Calc) 10/02/2022 89  mg/dL (calc) Final   Comment: Reference range: <100 . Desirable range <100 mg/dL for primary prevention;   <70 mg/dL for patients with CHD or diabetic patients  with > or = 2 CHD risk factors. Marland Kitchen LDL-C is now calculated using the Martin-Hopkins  calculation, which is a validated novel method providing  better accuracy than the Friedewald equation in the  estimation of LDL-C.  Horald Pollen et al. Lenox Ahr. 4166;063(01): 2061-2068  (http://education.[facility].com/faq/FAQ164)    Total CHOL/HDL Ratio 10/02/2022 4.5  <6.0 (calc) Final   Non-HDL Cholesterol (Calc) 10/02/2022  113  <130 mg/dL (calc) Final   Comment: For patients with diabetes plus 1 major ASCVD risk  factor, treating to a non-HDL-C goal of <100 mg/dL  (LDL-C of <10 mg/dL) is considered a therapeutic  option.     Past Medical History:  Diagnosis Date   Acid reflux    DDD (degenerative disc disease), lumbosacral    bilateral neural foraminal stenosis at L5-S1, mass effect on  B exiting L5 nerve roots (2/20)   Dyslipidemia    Sleep apnea    cpap   Spondylolysis    Past Surgical History:  Procedure Laterality Date   ABDOMINAL EXPOSURE N/A 03/13/2019   Procedure: ABDOMINAL EXPOSURE;  Surgeon: Cephus Shelling, MD;  Location: [facility] OR;  Service: Vascular;  Laterality: N/A;   ANTERIOR LUMBAR FUSION N/A 03/13/2019   Procedure: Lumbar five Sacral one Anterior lumbar interbody fusion with percutaneous posterior instrumentation;  Surgeon: Shirlean Kelly, MD;  Location: [facility] OR;  Service: Neurosurgery;  Laterality: N/A;   APPLICATION OF ROBOTIC ASSISTANCE FOR SPINAL PROCEDURE N/A 03/13/2019   Procedure: APPLICATION OF ROBOTIC ASSISTANCE FOR SPINAL PROCEDURE;  Surgeon: Shirlean Kelly, MD;  Location: [facility] OR;  Service: Neurosurgery;  Laterality: N/A;   LUMBAR PERCUTANEOUS PEDICLE SCREW 1 LEVEL N/A 03/13/2019   Procedure: LUMBAR PERCUTANEOUS PEDICLE SCREW LUMBAR FIVE-SACRAL ONE LEVEL;  Surgeon: Shirlean Kelly, MD;  Location: [facility] OR;  Service: Neurosurgery;  Laterality: N/A;   TONSILLECTOMY     as a child   Current Outpatient Medications on File Prior to Visit  Medication Sig Dispense Refill   doxycycline (VIBRA-TABS) 100 MG tablet Take 1 tablet (100 mg total) by mouth 2 (two) times daily. 20 tablet 0   gabapentin (NEURONTIN) 300 MG capsule TAKE 1 CAPSULE BY MOUTH 3 TIMES A DAY AS NEEDED FOR NERVE PAIN 90 capsule 3   meclizine (ANTIVERT) 25 MG tablet Take 1 tablet (25 mg total) by mouth 3 (three) times daily as needed for dizziness. 30 tablet 0   No current facility-administered medications on file prior  to visit.   Allergies  Allergen Reactions   Morphine And Codeine Anaphylaxis    03/13/19 Per pt, he was told that he had throat closing to morphine when his tonsils were removed as a child. Has tolerated oxycodone and hydromorphone for various procedures as an adult    Codeine Other (See Comments)    Reaction unknown   Penicillins Other (See Comments)    Reaction unknown Did it involve swelling of the face/tongue/throat, SOB, or low BP? Unknown Did it involve sudden or severe rash/hives, skin peeling, or any reaction on the inside of your mouth or nose? Unknown Did you need to seek medical attention at a hospital or doctor's office? Unknown When did it last happen? Informed as a child that he was allergic  If all above answers are "NO", may proceed with cephalosporin use.    Sulfa Antibiotics Other (See Comments)    Reaction unknown   Social History   Socioeconomic History   Marital status: Married    Spouse name: [name]   Number of children: 0   Years of education: BS   Highest education level: Not on file  Occupational History   Occupation: Nature conservation officer    Comment: [employer]  Tobacco Use   Smoking status: Never   Smokeless tobacco: Never  Substance and Sexual Activity   Alcohol use: Yes    Comment: rarely   Drug use: No   Sexual activity: Not on file  Other Topics Concern   Not on file  Social History Narrative   Patient consumes 2 glasses of caffeine daily,is right handed   Social Determinants of Health   Financial Resource Strain: Not on file  Food Insecurity: Not on file  Transportation Needs: Not on file  Physical Activity: Not on file  Stress: Not on file  Social Connections: Not on file  Intimate Partner Violence: Not on file  Review of Systems     Objective:   Physical Exam Vitals reviewed.  Constitutional:      General: He is not in acute distress.    Appearance: Normal appearance. He is well-developed and normal weight. He is not  ill-appearing, toxic-appearing or diaphoretic.  HENT:     Head: Normocephalic and atraumatic.     Right Ear: Tympanic membrane and ear canal normal. There is no impacted cerumen.     Left Ear: Tympanic membrane and ear canal normal. There is no impacted cerumen.     Nose: Nose normal. No congestion or rhinorrhea.     Mouth/Throat:     Mouth: Mucous membranes are moist.     Pharynx: No oropharyngeal exudate or posterior oropharyngeal erythema.  Eyes:     General: No scleral icterus.       Right eye: No discharge.        Left eye: No discharge.     Extraocular Movements: Extraocular movements intact.     Conjunctiva/sclera: Conjunctivae normal.     Pupils: Pupils are equal, round, and reactive to light.  Neck:     Vascular: No carotid bruit.  Cardiovascular:     Rate and Rhythm: Normal rate and regular rhythm.     Pulses: Normal pulses.     Heart sounds: Normal heart sounds. No murmur heard.    No friction rub. No gallop.  Pulmonary:     Effort: Pulmonary effort is normal. No respiratory distress.     Breath sounds: Normal breath sounds. No stridor. No wheezing, rhonchi or rales.  Chest:     Chest wall: No tenderness.  Abdominal:     General: Abdomen is flat. Bowel sounds are normal. There is no distension.     Palpations: Abdomen is soft. There is no mass.     Tenderness: There is no abdominal tenderness. There is no guarding or rebound.     Hernia: No hernia is present.  Musculoskeletal:     Cervical back: Neck supple. Pain with movement and muscular tenderness present. No spinous process tenderness. Decreased range of motion.     Right lower leg: No edema.     Left lower leg: No edema.  Lymphadenopathy:     Cervical: No cervical adenopathy.  Skin:    General: Skin is warm.     Coloration: Skin is not jaundiced or pale.     Findings: No bruising, erythema, lesion or rash.  Neurological:     General: No focal deficit present.     Mental Status: He is alert and oriented to  person, place, and time.     Cranial Nerves: No cranial nerve deficit.     Sensory: No sensory deficit.     Motor: No weakness.     Coordination: Coordination normal.     Gait: Gait normal.     Deep Tendon Reflexes: Reflexes normal.  Psychiatric:        Mood and Affect: Mood normal.        Behavior: Behavior normal.        Thought Content: Thought content normal.        Judgment: Judgment normal.    Assessment & Plan:  Routine general medical examination at a health care facility  Dyslipidemia  Cervicalgia - Plan: MR Cervical Spine Wo Contrast  Cervical radiculopathy - Plan: MR Cervical Spine Wo Contrast Patient's immunizations are up-to-date.  Cancer screening is not yet due.  Recommended decreasing carbs in his diet especially junk food around  bedtime.  A1c is acceptable.  The remainder of his looks excellent.  Drink more water and avoid overusing NSAIDs given the elevation in his creatinine.  Proceed with MRI of the cervical spine if the patient's neck pain has been present for 2 years.  X-ray showed no abnormality.  He has tried and failed NSAIDs, gabapentin, stretching.

## 2022-10-12 ENCOUNTER — Other Ambulatory Visit: Payer: 59

## 2022-10-26 ENCOUNTER — Encounter: Payer: Self-pay | Admitting: Family Medicine

## 2022-10-26 ENCOUNTER — Telehealth: Payer: Self-pay

## 2022-10-26 NOTE — Telephone Encounter (Signed)
Msg from referral dept.  Trula Slade  to Me  [facility]   10/26/22 12:20 PM Alia:  The MR Cervical Spine request was denied by the patient's insurance company. The reason they gave is:  Not Medically Necessary  You will have to get with Dr. Tanya Nones on the next course to take.  Thanks Linda   Pls advise?

## 2022-10-26 NOTE — Telephone Encounter (Signed)
Can you give me an update on this order MR Cervical Spine Wo Contrast (Order [id number])?  Per pt still have not heard from MRI?  Thank you

## 2022-11-19 ENCOUNTER — Ambulatory Visit: Payer: Managed Care, Other (non HMO) | Admitting: Adult Health

## 2022-11-19 ENCOUNTER — Encounter: Payer: Self-pay | Admitting: Adult Health

## 2022-11-19 VITALS — BP 124/81 | HR 58 | Ht 68.0 in | Wt 205.0 lb

## 2022-11-19 DIAGNOSIS — G4733 Obstructive sleep apnea (adult) (pediatric): Secondary | ICD-10-CM | POA: Diagnosis not present

## 2022-11-19 NOTE — Patient Instructions (Signed)
Continue using CPAP nightly and greater than 4 hours each night Order placed for home sleep test pending results we will order new machine If your symptoms worsen or you develop new symptoms please let us know.

## 2022-11-19 NOTE — Progress Notes (Addendum)
PATIENT: Rick Maldonado DOB: 1985/07/07  REASON FOR VISIT: follow up HISTORY FROM: patient  Chief Complaint  Patient presents with   Follow-up    Pt in 9  Pt here for CPAP f/u Pt wants to discuss new machine Pt start date 02/13/2014      HISTORY OF PRESENT ILLNESS: Today 11/19/22:  Rick Maldonado is a 37 y.o. male with a history of obstructive sleep apnea on CPAP. Returns today for follow-up.  Reports that CPAP is working well.  He has had this machine since 2015.  He would like to get a new machine.  Last sleep study was in 2015.  His download is below    11/18/21: Rick Maldonado is a 37 year old male with a history of obstructive sleep apnea on CPAP.  He returns today for follow-up.  He reports that CPAP is working well.  He denies any new issues.  Doesn't use water in the machine. He returns today for evaluation.      11/19/20: Rick Maldonado is a 37 year old male with a history of obstructive sleep apnea on CPAP.  He returns today for virtual visit.  The patient reports that the CPAP continues to work fairly well for him.  He did have back surgery and is now only able to sleep in the supine position.  He reports that his wife notes that he does snore with the machine.  He denies any weight gain.  The patient also reports that he likes to go camping but is unable to use the machine when he does this.     REVIEW OF SYSTEMS: Out of a complete 14 system review of symptoms, the patient complains only of the following symptoms, and all other reviewed systems are negative.  ESS 6  ALLERGIES: Allergies  Allergen Reactions   Morphine And Codeine Anaphylaxis    03/13/19 Per pt, he was told that he had throat closing to morphine when his tonsils were removed as a child. Has tolerated oxycodone and hydromorphone for various procedures as an adult    Codeine Other (See Comments)    Reaction unknown   Penicillins Other (See Comments)    Reaction unknown Did it involve swelling of  the face/tongue/throat, SOB, or low BP? Unknown Did it involve sudden or severe rash/hives, skin peeling, or any reaction on the inside of your mouth or nose? Unknown Did you need to seek medical attention at a hospital or doctor's office? Unknown When did it last happen? Informed as a child that he was allergic  If all above answers are "NO", may proceed with cephalosporin use.    Sulfa Antibiotics Other (See Comments)    Reaction unknown    HOME MEDICATIONS: Outpatient Medications Prior to Visit  Medication Sig Dispense Refill   cyclobenzaprine (FLEXERIL) 10 MG tablet Take 1 tablet (10 mg total) by mouth 3 (three) times daily as needed for muscle spasms. 30 tablet 0   gabapentin (NEURONTIN) 300 MG capsule TAKE 1 CAPSULE BY MOUTH 3 TIMES A DAY AS NEEDED FOR NERVE PAIN 90 capsule 3   meclizine (ANTIVERT) 25 MG tablet Take 1 tablet (25 mg total) by mouth 3 (three) times daily as needed for dizziness. 30 tablet 0   No facility-administered medications prior to visit.    PAST MEDICAL HISTORY: Past Medical History:  Diagnosis Date   Acid reflux    DDD (degenerative disc disease), lumbosacral    bilateral neural foraminal stenosis at L5-S1, mass effect on B exiting L5  nerve roots (2/20)   Dyslipidemia    Sleep apnea    cpap   Spondylolysis     PAST SURGICAL HISTORY: Past Surgical History:  Procedure Laterality Date   ABDOMINAL EXPOSURE N/A 03/13/2019   Procedure: ABDOMINAL EXPOSURE;  Surgeon: Cephus Shelling, MD;  Location: [facility] OR;  Service: Vascular;  Laterality: N/A;   ANTERIOR LUMBAR FUSION N/A 03/13/2019   Procedure: Lumbar five Sacral one Anterior lumbar interbody fusion with percutaneous posterior instrumentation;  Surgeon: Shirlean Kelly, MD;  Location: [facility] OR;  Service: Neurosurgery;  Laterality: N/A;   APPLICATION OF ROBOTIC ASSISTANCE FOR SPINAL PROCEDURE N/A 03/13/2019   Procedure: APPLICATION OF ROBOTIC ASSISTANCE FOR SPINAL PROCEDURE;  Surgeon: Shirlean Kelly, MD;   Location: [facility] OR;  Service: Neurosurgery;  Laterality: N/A;   LUMBAR PERCUTANEOUS PEDICLE SCREW 1 LEVEL N/A 03/13/2019   Procedure: LUMBAR PERCUTANEOUS PEDICLE SCREW LUMBAR FIVE-SACRAL ONE LEVEL;  Surgeon: Shirlean Kelly, MD;  Location: [facility] OR;  Service: Neurosurgery;  Laterality: N/A;   TONSILLECTOMY     as a child    FAMILY HISTORY: Family History  Problem Relation Age of Onset   Other Mother        drug addiction   Arthritis Father     SOCIAL HISTORY: Social History   Socioeconomic History   Marital status: Married    Spouse name: [name]   Number of children: 0   Years of education: BS   Highest education level: Not on file  Occupational History   Occupation: Nature conservation officer    Comment: [employer]  Tobacco Use   Smoking status: Never   Smokeless tobacco: Never  Substance and Sexual Activity   Alcohol use: Yes    Comment: rarely   Drug use: No   Sexual activity: Not on file  Other Topics Concern   Not on file  Social History Narrative   Patient consumes 2 glasses of caffeine daily,is right handed   Social Determinants of Health   Financial Resource Strain: Not on file  Food Insecurity: Not on file  Transportation Needs: Not on file  Physical Activity: Not on file  Stress: Not on file  Social Connections: Not on file  Intimate Partner Violence: Not on file      PHYSICAL EXAM Generalized: Well developed, in no acute distress   Neurological examination  Mentation: Alert oriented to time, place, history taking. Follows all commands speech and language fluent Cranial nerve II-XII:Facial symmetry noted.    DIAGNOSTIC DATA (LABS, IMAGING, TESTING) - I reviewed patient records, labs, notes, testing and imaging myself where available.  Lab Results  Component Value Date   WBC 6.4 10/02/2022   HGB 16.1 10/02/2022   HCT 47.5 10/02/2022   MCV 91.7 10/02/2022   PLT 167 10/02/2022      Component Value Date/Time   NA 138 10/02/2022 0800   K 4.4  10/02/2022 0800   CL 103 10/02/2022 0800   CO2 30 10/02/2022 0800   GLUCOSE 136 (H) 10/02/2022 0800   BUN 20 10/02/2022 0800   CREATININE 1.27 (H) 10/02/2022 0800   CALCIUM 9.7 10/02/2022 0800   PROT 6.6 10/02/2022 0800   ALBUMIN 4.3 01/07/2016 0825   AST 22 10/02/2022 0800   ALT 33 10/02/2022 0800   ALKPHOS 43 01/07/2016 0825   BILITOT 0.9 10/02/2022 0800   GFRNONAA 83 08/20/2020 0822   GFRAA 96 08/20/2020 0822   Lab Results  Component Value Date   CHOL 145 10/02/2022   HDL 32 (L) 10/02/2022  LDLCALC 89 10/02/2022   TRIG 144 10/02/2022   CHOLHDL 4.5 10/02/2022   Lab Results  Component Value Date   HGBA1C 5.5 10/02/2022     ASSESSMENT AND PLAN 37 y.o. year old male  has a past medical history of Acid reflux, DDD (degenerative disc disease), lumbosacral, Dyslipidemia, Sleep apnea, and Spondylolysis. here with:  OSA on CPAP  CPAP compliance excellent Residual AHI is good Encouraged patient to continue using CPAP nightly and > 4 hours each night Repeat home sleep test pending results we will order new machine F/U after HST  or sooner if needed    Butch Penny, MSN, NP-C 11/19/2022, 10:30 AM [facility] [address] [place], [place] [address] [phone]

## 2022-12-22 ENCOUNTER — Ambulatory Visit: Payer: Managed Care, Other (non HMO) | Admitting: Neurology

## 2022-12-22 DIAGNOSIS — G4733 Obstructive sleep apnea (adult) (pediatric): Secondary | ICD-10-CM | POA: Diagnosis not present

## 2022-12-23 NOTE — Progress Notes (Signed)
See procedure note.

## 2022-12-23 NOTE — Procedures (Signed)
[facility]  HOME SLEEP TEST (Watch PAT) REPORT  STUDY DATE: 12/22/2022  DOB: [birth year]  MRN: [id number]  ORDERING CLINICIAN: [name], MD, PhD   REFERRING CLINICIAN: Butch Penny, NP  CLINICAL INFORMATION/HISTORY: 37 year old male with an underlying medical history of reflux disease, degenerative lumbar disc disease, hyperlipidemia, and mild obesity, who presents for reevaluation of his obstructive sleep apnea.  He has been compliant with CPAP of 10 cm with EPR of 3 with good apnea control, good tolerance, low leak.  He should be eligible for a new machine.  He has benefited from treatment and has been compliant with CPAP therapy.  Epworth sleepiness score: 6/24.  BMI: 31.1 kg/m  FINDINGS:   Sleep Summary:   Total Recording Time (hours, min): 8 hours, 1 min  Total Sleep Time (hours, min):  7 hours, 17 min  Percent REM (%):    13.1%   Respiratory Indices:   Calculated pAHI (per hour):  72.8/hour         REM pAHI:    45.2/hour       NREM pAHI: 77/hour  Central pAHI: 2.1/hour  Oxygen Saturation Statistics:    Oxygen Saturation (%) Mean: 94%   Minimum oxygen saturation (%):                 81%   O2 Saturation Range (%): 81 - 100%    O2 Saturation (minutes) <=88%: 10.1 min  Pulse Rate Statistics:   Pulse Mean (bpm):    49/min    Pulse Range (37 - 99/min)   IMPRESSION: OSA (obstructive sleep apnea), severe  RECOMMENDATION:  This home sleep test demonstrates severe obstructive sleep apnea with a total AHI of 72.8/hour and O2 nadir of 81%.  Snoring was detected, ranging from mild to loud.  Ongoing treatment with positive airway pressure is highly recommended. This patient has been compliant with CPAP of 10 cm with EPR of 3, which has provided excellent apnea control.  Current mask seal is also good, patient seems to tolerate treatment and should be eligible for a new machine.  I would recommend staying with the current pressure settings  and continuing with the same interface as tolerated. A laboratory attended titration study can be considered in the future for optimization of treatment settings and to improve tolerance and compliance. Alternative treatment options are limited secondary to the severity of the patient's sleep disordered breathing, but may include surgical treatment with an implantable hypoglossal nerve stimulator (in carefully selected candidates, meeting criteria).  Concomitant weight loss is recommended, where clinically appropriate. Please note, that untreated obstructive sleep apnea may carry additional perioperative morbidity. Patients with significant obstructive sleep apnea should receive perioperative PAP therapy and the surgeons and particularly the anesthesiologist should be informed of the diagnosis and the severity of the sleep disordered breathing. The patient should be cautioned not to drive, work at heights, or operate dangerous or heavy equipment when tired or sleepy. Review and reiteration of good sleep hygiene measures should be pursued with any patient. Other causes of the patient's symptoms, including circadian rhythm disturbances, an underlying mood disorder, medication effect and/or an underlying medical problem cannot be ruled out based on this test. Clinical correlation is recommended.  The patient and his referring provider will be notified of the test results. The patient will be seen in follow up in sleep clinic at [facility].  I certify that I have reviewed the raw data recording prior to the issuance of this report in accordance with the  standards of the American Academy of Sleep Medicine (AASM).  INTERPRETING PHYSICIAN:   [name], MD, PhD Medical Director, [facility]) Diplomat, ABPN (Neurology and Sleep)   [facility] [address], [address] [place], [place] [address] [phone]

## 2022-12-25 ENCOUNTER — Other Ambulatory Visit: Payer: Self-pay | Admitting: Adult Health

## 2022-12-25 DIAGNOSIS — G4733 Obstructive sleep apnea (adult) (pediatric): Secondary | ICD-10-CM

## 2022-12-28 ENCOUNTER — Telehealth: Payer: Self-pay | Admitting: *Deleted

## 2022-12-28 NOTE — Telephone Encounter (Signed)
Called pt & LVM with office number asking for call back.  

## 2022-12-28 NOTE — Telephone Encounter (Signed)
Sleep study on 12/22/22. Order for new machine sent to [facility].  Message sent to patient to schedule an initial machine f/u for January.   Order:  New machine, ResMed pressure 10 cmH2O with EPR of 3    Your home sleep test showed that you have severe obstructive sleep apnea with a total of 72 events an hour that you stopped breathing.  I will send in an order for new machine we will keep your pressure the same at 10 cm of water with EPR of 3.  Please call if you have any questions  Written by Butch Penny, NP on 12/25/2022 10:25 AM EDT Seen by patient Rick Maldonado on 12/27/2022  3:09 PM

## 2022-12-29 NOTE — Telephone Encounter (Signed)
[facility] confirmed receipt of order.  

## 2023-01-21 ENCOUNTER — Other Ambulatory Visit: Payer: Self-pay | Admitting: Neurology

## 2023-01-21 DIAGNOSIS — G4733 Obstructive sleep apnea (adult) (pediatric): Secondary | ICD-10-CM

## 2023-03-25 ENCOUNTER — Telehealth: Payer: Self-pay | Admitting: Adult Health

## 2023-03-25 DIAGNOSIS — G4733 Obstructive sleep apnea (adult) (pediatric): Secondary | ICD-10-CM

## 2023-03-25 NOTE — Telephone Encounter (Signed)
Patient rescheduled 1/27 appt. to May. He has not ordered the new machine. He said [facility] has not been helpful and he wanted to discuss what he could do,or where else you suggest to go.

## 2023-03-25 NOTE — Telephone Encounter (Signed)
I spoke with the patient. He would like to use [facility] which is in network with his insurance. We will send his orders to them and get this change of DME approved by MM NP.

## 2023-03-25 NOTE — Addendum Note (Signed)
Addended by: Bertram Savin on: 03/25/2023 04:48 PM   Modules accepted: Orders

## 2023-03-29 ENCOUNTER — Ambulatory Visit: Payer: Managed Care, Other (non HMO) | Admitting: Adult Health

## 2023-07-27 ENCOUNTER — Telehealth: Payer: Self-pay | Admitting: Adult Health

## 2023-07-27 NOTE — Telephone Encounter (Signed)
 I have sent an urgent message over to [facility] to follow-up. Upon review of chart, I see that we had referred patient to [facility] last fall. The patient decided not to proceed with [facility] so the plan was to switch him to [facility]. An order was placed back in January but it is unclear if the order was confirmed/received by [facility]. If they did not receive it I apologize. I tried to call the patient to discuss but his VM cut off and I was unable to leave a message.

## 2023-07-27 NOTE — Telephone Encounter (Signed)
 Salick, Jeanne Milliner, Jodeen Munch, RN; Zott, [place]; Bolton, Tammy Got it thanks!

## 2023-07-27 NOTE — Telephone Encounter (Signed)
 Please call  patient he never received the machine as of yet and wanted to know where his prescription was sent because he never received a call. Pt will need to reschedule.

## 2023-07-27 NOTE — Telephone Encounter (Signed)
 Patient returned my call. He has been using his current cpap machine and forgot about the new one needed but he would still like to proceed. I apologized for the delay in getting his order to [facility]. Patient verbalized appreciation and understanding. He will watch for a call from [facility] within 1 week and he will call us  to schedule the 30-90 day follow-up when he gets his new machine.

## 2023-07-29 ENCOUNTER — Ambulatory Visit: Payer: Managed Care, Other (non HMO) | Admitting: Adult Health

## 2023-08-17 ENCOUNTER — Telehealth: Payer: Self-pay | Admitting: Adult Health

## 2023-08-17 NOTE — Telephone Encounter (Signed)
 Pt was scheduled for his initial CPAP visit on 10-13-23 Pt was informed to bring machine and power cord to the appointment.

## 2023-10-13 ENCOUNTER — Ambulatory Visit (INDEPENDENT_AMBULATORY_CARE_PROVIDER_SITE_OTHER): Admitting: Adult Health

## 2023-10-13 VITALS — BP 131/88 | HR 95 | Wt 207.6 lb

## 2023-10-13 DIAGNOSIS — G4733 Obstructive sleep apnea (adult) (pediatric): Secondary | ICD-10-CM

## 2023-10-13 NOTE — Progress Notes (Signed)
 PATIENT: Rick Maldonado DOB: 1985-03-05  REASON FOR VISIT: follow up HISTORY FROM: patient  Chief Complaint  Patient presents with   Obstructive Sleep Apnea    CPAP, pt in room 19, self, has CPAP today, has no new concerns today     HISTORY OF PRESENT ILLNESS: Today 10/13/23:  Rick Maldonado is a 38 y.o. male with a history of OSA on CPAP. Returns today for follow-up.  Patient got a new machine.  He reports that is working well for him.  He states that he does not use the humidification feature.  He denies any new medical issues.  His download is below     11/19/22: Rick Maldonado is a 38 y.o. male with a history of obstructive sleep apnea on CPAP. Returns today for follow-up.  Reports that CPAP is working well.  He has had this machine since 2015.  He would like to get a new machine.  Last sleep study was in 2015.  His download is below    11/18/21: Rick Maldonado is a 38 year old male with a history of obstructive sleep apnea on CPAP.  He returns today for follow-up.  He reports that CPAP is working well.  He denies any new issues.  Doesn't use water in the machine. He returns today for evaluation.      11/19/20: Rick Maldonado is a 38 year old male with a history of obstructive sleep apnea on CPAP.  He returns today for virtual visit.  The patient reports that the CPAP continues to work fairly well for him.  He did have back surgery and is now only able to sleep in the supine position.  He reports that his wife notes that he does snore with the machine.  He denies any weight gain.  The patient also reports that he likes to go camping but is unable to use the machine when he does this.     REVIEW OF SYSTEMS: Out of a complete 14 system review of symptoms, the patient complains only of the following symptoms, and all other reviewed systems are negative.  ESS 6  ALLERGIES: Allergies  Allergen Reactions   Morphine And Codeine Anaphylaxis    03/13/19 Per pt, he was told  that he had throat closing to morphine when his tonsils were removed as a child. Has tolerated oxycodone and hydromorphone  for various procedures as an adult    Codeine Other (See Comments)    Reaction unknown   Penicillins Other (See Comments)    Reaction unknown Did it involve swelling of the face/tongue/throat, SOB, or low BP? Unknown Did it involve sudden or severe rash/hives, skin peeling, or any reaction on the inside of your mouth or nose? Unknown Did you need to seek medical attention at a hospital or doctor's office? Unknown When did it last happen? Informed as a child that he was allergic  If all above answers are NO, may proceed with cephalosporin use.    Sulfa Antibiotics Other (See Comments)    Reaction unknown    HOME MEDICATIONS: Outpatient Medications Prior to Visit  Medication Sig Dispense Refill   cyclobenzaprine  (FLEXERIL ) 10 MG tablet Take 1 tablet (10 mg total) by mouth 3 (three) times daily as needed for muscle spasms. (Patient not taking: Reported on 10/13/2023) 30 tablet 0   gabapentin  (NEURONTIN ) 300 MG capsule TAKE 1 CAPSULE BY MOUTH 3 TIMES A DAY AS NEEDED FOR NERVE PAIN (Patient not taking: Reported on 10/13/2023) 90 capsule 3   meclizine  (  ANTIVERT ) 25 MG tablet Take 1 tablet (25 mg total) by mouth 3 (three) times daily as needed for dizziness. (Patient not taking: Reported on 10/13/2023) 30 tablet 0   No facility-administered medications prior to visit.    PAST MEDICAL HISTORY: Past Medical History:  Diagnosis Date   Acid reflux    DDD (degenerative disc disease), lumbosacral    bilateral neural foraminal stenosis at L5-S1, mass effect on B exiting L5 nerve roots (2/20)   Dyslipidemia    Sleep apnea    cpap   Spondylolysis     PAST SURGICAL HISTORY: Past Surgical History:  Procedure Laterality Date   ABDOMINAL EXPOSURE N/A 03/13/2019   Procedure: ABDOMINAL EXPOSURE;  Surgeon: Gretta Lonni PARAS, MD;  Location: [facility] OR;  Service: Vascular;   Laterality: N/A;   ANTERIOR LUMBAR FUSION N/A 03/13/2019   Procedure: Lumbar five Sacral one Anterior lumbar interbody fusion with percutaneous posterior instrumentation;  Surgeon: Alix Charleston, MD;  Location: [facility] OR;  Service: Neurosurgery;  Laterality: N/A;   APPLICATION OF ROBOTIC ASSISTANCE FOR SPINAL PROCEDURE N/A 03/13/2019   Procedure: APPLICATION OF ROBOTIC ASSISTANCE FOR SPINAL PROCEDURE;  Surgeon: Alix Charleston, MD;  Location: [facility] OR;  Service: Neurosurgery;  Laterality: N/A;   LUMBAR PERCUTANEOUS PEDICLE SCREW 1 LEVEL N/A 03/13/2019   Procedure: LUMBAR PERCUTANEOUS PEDICLE SCREW LUMBAR FIVE-SACRAL ONE LEVEL;  Surgeon: Alix Charleston, MD;  Location: [facility] OR;  Service: Neurosurgery;  Laterality: N/A;   TONSILLECTOMY     as a child    FAMILY HISTORY: Family History  Problem Relation Age of Onset   Other Mother        drug addiction   Arthritis Father    Sleep apnea Father     SOCIAL HISTORY: Social History   Socioeconomic History   Marital status: Married    Spouse name: Comer   Number of children: 0   Years of education: BS   Highest education level: Not on file  Occupational History   Occupation: Nature conservation officer    Comment: [employer]  Tobacco Use   Smoking status: Never   Smokeless tobacco: Never  Substance and Sexual Activity   Alcohol use: Yes    Comment: rarely a beer   Drug use: No   Sexual activity: Not on file  Other Topics Concern   Not on file  Social History Narrative   Patient consumes 2 glasses of caffeine daily,is right handed   Social Drivers of Corporate investment banker Strain: Not on file  Food Insecurity: Not on file  Transportation Needs: Not on file  Physical Activity: Not on file  Stress: Not on file  Social Connections: Not on file  Intimate Partner Violence: Not on file      PHYSICAL EXAM Generalized: Well developed, in no acute distress   Neurological examination  Mentation: Alert oriented to time, place, history  taking. Follows all commands speech and language fluent Cranial nerve II-XII:Facial symmetry noted.    DIAGNOSTIC DATA (LABS, IMAGING, TESTING) - I reviewed patient records, labs, notes, testing and imaging myself where available.  Lab Results  Component Value Date   WBC 6.4 10/02/2022   HGB 16.1 10/02/2022   HCT 47.5 10/02/2022   MCV 91.7 10/02/2022   PLT 167 10/02/2022      Component Value Date/Time   NA 138 10/02/2022 0800   K 4.4 10/02/2022 0800   CL 103 10/02/2022 0800   CO2 30 10/02/2022 0800   GLUCOSE 136 (H) 10/02/2022 0800   BUN 20  10/02/2022 0800   CREATININE 1.27 (H) 10/02/2022 0800   CALCIUM 9.7 10/02/2022 0800   PROT 6.6 10/02/2022 0800   ALBUMIN  4.3 01/07/2016 0825   AST 22 10/02/2022 0800   ALT 33 10/02/2022 0800   ALKPHOS 43 01/07/2016 0825   BILITOT 0.9 10/02/2022 0800   GFRNONAA 83 08/20/2020 0822   GFRAA 96 08/20/2020 0822   Lab Results  Component Value Date   CHOL 145 10/02/2022   HDL 32 (L) 10/02/2022   LDLCALC 89 10/02/2022   TRIG 144 10/02/2022   CHOLHDL 4.5 10/02/2022   Lab Results  Component Value Date   HGBA1C 5.5 10/02/2022     ASSESSMENT AND PLAN 38 y.o. year old male  has a past medical history of Acid reflux, DDD (degenerative disc disease), lumbosacral, Dyslipidemia, Sleep apnea, and Spondylolysis. here with:  OSA on CPAP  CPAP compliance excellent Residual AHI is good Encouraged patient to continue using CPAP nightly and > 4 hours each night Follow-up in 1 year or sooner if needed     Duwaine Russell, MSN, NP-C 10/13/2023, 11:19 AM [facility] [address] [place], [place] [address] [phone]  The patient's condition requires frequent monitoring and adjustments in the treatment plan, reflecting the ongoing complexity of care.  This provider is the continuing focal point for all needed services for this condition.

## 2023-10-13 NOTE — Patient Instructions (Signed)
 Continue using CPAP nightly and greater than 4 hours each night If your symptoms worsen or you develop new symptoms please let us  know.

## 2023-10-27 ENCOUNTER — Telehealth: Payer: Self-pay

## 2023-10-27 NOTE — Telephone Encounter (Signed)
 Copied from CRM [id number]. Topic: Clinical - Request for Lab/Test Order >> Oct 26, 2023  4:44 PM Tobias CROME wrote: Reason for CRM: Patient requesting physical labs before physical scheduled on 11/19/23.   Please reach out to patient once orders have been placed.

## 2023-11-16 ENCOUNTER — Other Ambulatory Visit

## 2023-11-16 DIAGNOSIS — R7303 Prediabetes: Secondary | ICD-10-CM

## 2023-11-16 DIAGNOSIS — E785 Hyperlipidemia, unspecified: Secondary | ICD-10-CM

## 2023-11-16 DIAGNOSIS — Z0000 Encounter for general adult medical examination without abnormal findings: Secondary | ICD-10-CM

## 2023-11-17 LAB — CBC WITH DIFFERENTIAL/PLATELET
Absolute Lymphocytes: 1956 {cells}/uL (ref 850–3900)
Absolute Monocytes: 556 {cells}/uL (ref 200–950)
Basophils Absolute: 27 {cells}/uL (ref 0–200)
Basophils Relative: 0.4 %
Eosinophils Absolute: 141 {cells}/uL (ref 15–500)
Eosinophils Relative: 2.1 %
HCT: 45.2 % (ref 38.5–50.0)
Hemoglobin: 15.5 g/dL (ref 13.2–17.1)
MCH: 31.4 pg (ref 27.0–33.0)
MCHC: 34.3 g/dL (ref 32.0–36.0)
MCV: 91.7 fL (ref 80.0–100.0)
MPV: 10.4 fL (ref 7.5–12.5)
Monocytes Relative: 8.3 %
Neutro Abs: 4020 {cells}/uL (ref 1500–7800)
Neutrophils Relative %: 60 %
Platelets: 165 Thousand/uL (ref 140–400)
RBC: 4.93 Million/uL (ref 4.20–5.80)
RDW: 12.6 % (ref 11.0–15.0)
Total Lymphocyte: 29.2 %
WBC: 6.7 Thousand/uL (ref 3.8–10.8)

## 2023-11-17 LAB — COMPLETE METABOLIC PANEL WITHOUT GFR
AG Ratio: 2.7 (calc) — ABNORMAL HIGH (ref 1.0–2.5)
ALT: 29 U/L (ref 9–46)
AST: 19 U/L (ref 10–40)
Albumin: 4.6 g/dL (ref 3.6–5.1)
Alkaline phosphatase (APISO): 57 U/L (ref 36–130)
BUN: 15 mg/dL (ref 7–25)
CO2: 30 mmol/L (ref 20–32)
Calcium: 9.1 mg/dL (ref 8.6–10.3)
Chloride: 102 mmol/L (ref 98–110)
Creat: 1.05 mg/dL (ref 0.60–1.26)
Globulin: 1.7 g/dL — ABNORMAL LOW (ref 1.9–3.7)
Glucose, Bld: 109 mg/dL — ABNORMAL HIGH (ref 65–99)
Potassium: 4.5 mmol/L (ref 3.5–5.3)
Sodium: 139 mmol/L (ref 135–146)
Total Bilirubin: 1.1 mg/dL (ref 0.2–1.2)
Total Protein: 6.3 g/dL (ref 6.1–8.1)

## 2023-11-17 LAB — LIPID PANEL
Cholesterol: 159 mg/dL (ref <200)
HDL: 30 mg/dL — ABNORMAL LOW (ref >=40)
LDL Cholesterol (Calc): 102 mg/dL — ABNORMAL HIGH
Non-HDL Cholesterol (Calc): 129 mg/dL (ref <130)
Total CHOL/HDL Ratio: 5.3 (calc) — ABNORMAL HIGH (ref <5.0)
Triglycerides: 174 mg/dL — ABNORMAL HIGH (ref <150)

## 2023-11-17 LAB — HEMOGLOBIN A1C
Hgb A1c MFr Bld: 5.6 % (ref <5.7)
Mean Plasma Glucose: 114 mg/dL
eAG (mmol/L): 6.3 mmol/L

## 2023-11-18 ENCOUNTER — Ambulatory Visit: Payer: Self-pay | Admitting: Family Medicine

## 2023-11-19 ENCOUNTER — Ambulatory Visit (INDEPENDENT_AMBULATORY_CARE_PROVIDER_SITE_OTHER): Admitting: Family Medicine

## 2023-11-19 ENCOUNTER — Encounter: Payer: Self-pay | Admitting: Family Medicine

## 2023-11-19 VITALS — BP 132/84 | HR 51 | Temp 98.2°F | Ht 68.0 in | Wt 202.4 lb

## 2023-11-19 DIAGNOSIS — E785 Hyperlipidemia, unspecified: Secondary | ICD-10-CM

## 2023-11-19 DIAGNOSIS — Z0001 Encounter for general adult medical examination with abnormal findings: Secondary | ICD-10-CM

## 2023-11-19 DIAGNOSIS — Z0000 Encounter for general adult medical examination without abnormal findings: Secondary | ICD-10-CM

## 2023-11-19 NOTE — Progress Notes (Signed)
 Subjective:    Patient ID: Rick Maldonado, male    DOB: 08-Feb-1986, 38 y.o.   MRN: [id number]  HPI Patient is a 38 year old Caucasian gentleman here today for complete physical exam.  He has a history of an L5-S1 lumbar fusion in 2021.  He still deals with chronic low back pain related to this.  Patient states that he has recently been dealing with more pain in his neck and his lower back.  Therefore he has been slightly more sedentary over the last 2 months.  He has an appointment to see a sports medicine physician next week to try to assist in the back pain.  Otherwise has been doing well.  He admits to drinking [facility] and eating a high carbohydrate diet.  His blood sugar continues to be elevated at 109 but his hemoglobin A1c is acceptable at 5.6.  He denies any chest pain or shortness of breath.  He is trying to exercise when he can.  He does mountain biking.  He has a strong family history of heart disease on his father side.  His most recent lab work is listed below Lab on 11/16/2023  Component Date Value Ref Range Status   Glucose, Bld 11/16/2023 109 (H)  65 - 99 mg/dL Final   Comment: .            Fasting reference interval . For someone without known diabetes, a glucose value between 100 and 125 mg/dL is consistent with prediabetes and should be confirmed with a follow-up test. .    BUN 11/16/2023 15  7 - 25 mg/dL Final   Creat 90/83/7974 1.05  0.60 - 1.26 mg/dL Final   BUN/Creatinine Ratio 11/16/2023 SEE NOTE:  6 - 22 (calc) Final   Comment:    Not Reported: BUN and Creatinine are within    reference range. .    Sodium 11/16/2023 139  135 - 146 mmol/L Final   Potassium 11/16/2023 4.5  3.5 - 5.3 mmol/L Final   Chloride 11/16/2023 102  98 - 110 mmol/L Final   CO2 11/16/2023 30  20 - 32 mmol/L Final   Calcium 11/16/2023 9.1  8.6 - 10.3 mg/dL Final   Total Protein 90/83/7974 6.3  6.1 - 8.1 g/dL Final   Albumin  11/16/2023 4.6  3.6 - 5.1 g/dL Final   Globulin 90/83/7974  1.7 (L)  1.9 - 3.7 g/dL (calc) Final   AG Ratio 11/16/2023 2.7 (H)  1.0 - 2.5 (calc) Final   Total Bilirubin 11/16/2023 1.1  0.2 - 1.2 mg/dL Final   Alkaline phosphatase (APISO) 11/16/2023 57  36 - 130 U/L Final   AST 11/16/2023 19  10 - 40 U/L Final   ALT 11/16/2023 29  9 - 46 U/L Final   WBC 11/16/2023 6.7  3.8 - 10.8 Thousand/uL Final   RBC 11/16/2023 4.93  4.20 - 5.80 Million/uL Final   Hemoglobin 11/16/2023 15.5  13.2 - 17.1 g/dL Final   HCT 90/83/7974 45.2  38.5 - 50.0 % Final   MCV 11/16/2023 91.7  80.0 - 100.0 fL Final   MCH 11/16/2023 31.4  27.0 - 33.0 pg Final   MCHC 11/16/2023 34.3  32.0 - 36.0 g/dL Final   Comment: For adults, a slight decrease in the calculated MCHC value (in the range of 30 to 32 g/dL) is most likely not clinically significant; however, it should be interpreted with caution in correlation with other red cell parameters and the patient's clinical condition.    RDW  11/16/2023 12.6  11.0 - 15.0 % Final   Platelets 11/16/2023 165  140 - 400 Thousand/uL Final   MPV 11/16/2023 10.4  7.5 - 12.5 fL Final   Neutro Abs 11/16/2023 4,020  1,500 - 7,800 cells/uL Final   Absolute Lymphocytes 11/16/2023 1,956  850 - 3,900 cells/uL Final   Absolute Monocytes 11/16/2023 556  200 - 950 cells/uL Final   Eosinophils Absolute 11/16/2023 141  15 - 500 cells/uL Final   Basophils Absolute 11/16/2023 27  0 - 200 cells/uL Final   Neutrophils Relative % 11/16/2023 60  % Final   Total Lymphocyte 11/16/2023 29.2  % Final   Monocytes Relative 11/16/2023 8.3  % Final   Eosinophils Relative 11/16/2023 2.1  % Final   Basophils Relative 11/16/2023 0.4  % Final   Cholesterol 11/16/2023 159  <200 mg/dL Final   HDL 90/83/7974 30 (L)  > OR = 40 mg/dL Final   Triglycerides 90/83/7974 174 (H)  <150 mg/dL Final   LDL Cholesterol (Calc) 11/16/2023 102 (H)  mg/dL (calc) Final   Comment: Reference range: <100 . Desirable range <100 mg/dL for primary prevention;   <70 mg/dL for patients  with CHD or diabetic patients  with > or = 2 CHD risk factors. SABRA LDL-C is now calculated using the Martin-Hopkins  calculation, which is a validated novel method providing  better accuracy than the Friedewald equation in the  estimation of LDL-C.  Gladis APPLETHWAITE et al. SANDREA. 7986;689(80): 2061-2068  (http://education.[facility].com/faq/FAQ164)    Total CHOL/HDL Ratio 11/16/2023 5.3 (H)  <5.0 (calc) Final   Non-HDL Cholesterol (Calc) 11/16/2023 129  <130 mg/dL (calc) Final   Comment: For patients with diabetes plus 1 major ASCVD risk  factor, treating to a non-HDL-C goal of <100 mg/dL  (LDL-C of <29 mg/dL) is considered a therapeutic  option.    Hgb A1c MFr Bld 11/16/2023 5.6  <5.7 % Final   Comment: For the purpose of screening for the presence of diabetes: . <5.7%       Consistent with the absence of diabetes 5.7-6.4%    Consistent with increased risk for diabetes             (prediabetes) > or =6.5%  Consistent with diabetes . This assay result is consistent with a decreased risk of diabetes. . Currently, no consensus exists regarding use of hemoglobin A1c for diagnosis of diabetes in children. . According to American Diabetes Association (ADA) guidelines, hemoglobin A1c <7.0% represents optimal control in non-pregnant diabetic patients. Different metrics may apply to specific patient populations.  Standards of Medical Care in Diabetes(ADA). .    Mean Plasma Glucose 11/16/2023 114  mg/dL Final   eAG (mmol/L) 90/83/7974 6.3  mmol/L Final    Past Medical History:  Diagnosis Date   Acid reflux    DDD (degenerative disc disease), lumbosacral    bilateral neural foraminal stenosis at L5-S1, mass effect on B exiting L5 nerve roots (2/20)   Dyslipidemia    Sleep apnea    cpap   Spondylolysis    Past Surgical History:  Procedure Laterality Date   ABDOMINAL EXPOSURE N/A 03/13/2019   Procedure: ABDOMINAL EXPOSURE;  Surgeon: Gretta Lonni PARAS, MD;  Location: [facility] OR;   Service: Vascular;  Laterality: N/A;   ANTERIOR LUMBAR FUSION N/A 03/13/2019   Procedure: Lumbar five Sacral one Anterior lumbar interbody fusion with percutaneous posterior instrumentation;  Surgeon: Alix Charleston, MD;  Location: [facility] OR;  Service: Neurosurgery;  Laterality: N/A;   APPLICATION OF ROBOTIC ASSISTANCE  FOR SPINAL PROCEDURE N/A 03/13/2019   Procedure: APPLICATION OF ROBOTIC ASSISTANCE FOR SPINAL PROCEDURE;  Surgeon: Alix Charleston, MD;  Location: [facility] OR;  Service: Neurosurgery;  Laterality: N/A;   LUMBAR PERCUTANEOUS PEDICLE SCREW 1 LEVEL N/A 03/13/2019   Procedure: LUMBAR PERCUTANEOUS PEDICLE SCREW LUMBAR FIVE-SACRAL ONE LEVEL;  Surgeon: Alix Charleston, MD;  Location: [facility] OR;  Service: Neurosurgery;  Laterality: N/A;   TONSILLECTOMY     as a child   No current outpatient medications on file prior to visit.   No current facility-administered medications on file prior to visit.   Allergies  Allergen Reactions   Morphine And Codeine Anaphylaxis    03/13/19 Per pt, he was told that he had throat closing to morphine when his tonsils were removed as a child. Has tolerated oxycodone and hydromorphone  for various procedures as an adult    Codeine Other (See Comments)    Reaction unknown   Penicillins Other (See Comments)    Reaction unknown Did it involve swelling of the face/tongue/throat, SOB, or low BP? Unknown Did it involve sudden or severe rash/hives, skin peeling, or any reaction on the inside of your mouth or nose? Unknown Did you need to seek medical attention at a hospital or doctor's office? Unknown When did it last happen? Informed as a child that he was allergic  If all above answers are NO, may proceed with cephalosporin use.    Sulfa Antibiotics Other (See Comments)    Reaction unknown   Social History   Socioeconomic History   Marital status: Married    Spouse name: Comer   Number of children: 0   Years of education: BS   Highest education level: Not  on file  Occupational History   Occupation: Nature conservation officer    Comment: [employer]  Tobacco Use   Smoking status: Never   Smokeless tobacco: Never  Substance and Sexual Activity   Alcohol use: Yes    Comment: rarely a beer   Drug use: No   Sexual activity: Not on file  Other Topics Concern   Not on file  Social History Narrative   Patient consumes 2 glasses of caffeine daily,is right handed   Social Drivers of Corporate investment banker Strain: Not on file  Food Insecurity: Not on file  Transportation Needs: Not on file  Physical Activity: Not on file  Stress: Not on file  Social Connections: Not on file  Intimate Partner Violence: Not on file     Review of Systems     Objective:   Physical Exam Vitals reviewed.  Constitutional:      General: He is not in acute distress.    Appearance: Normal appearance. He is well-developed and normal weight. He is not ill-appearing, toxic-appearing or diaphoretic.  HENT:     Head: Normocephalic and atraumatic.     Right Ear: Tympanic membrane and ear canal normal. There is no impacted cerumen.     Left Ear: Tympanic membrane and ear canal normal. There is no impacted cerumen.     Nose: Nose normal. No congestion or rhinorrhea.     Mouth/Throat:     Mouth: Mucous membranes are moist.     Pharynx: No oropharyngeal exudate or posterior oropharyngeal erythema.  Eyes:     General: No scleral icterus.       Right eye: No discharge.        Left eye: No discharge.     Extraocular Movements: Extraocular movements intact.     Conjunctiva/sclera:  Conjunctivae normal.     Pupils: Pupils are equal, round, and reactive to light.  Neck:     Vascular: No carotid bruit.  Cardiovascular:     Rate and Rhythm: Normal rate and regular rhythm.     Pulses: Normal pulses.     Heart sounds: Normal heart sounds. No murmur heard.    No friction rub. No gallop.  Pulmonary:     Effort: Pulmonary effort is normal. No respiratory distress.      Breath sounds: Normal breath sounds. No stridor. No wheezing, rhonchi or rales.  Chest:     Chest wall: No tenderness.  Abdominal:     General: Abdomen is flat. Bowel sounds are normal. There is no distension.     Palpations: Abdomen is soft. There is no mass.     Tenderness: There is no abdominal tenderness. There is no guarding or rebound.     Hernia: No hernia is present.  Musculoskeletal:     Cervical back: Neck supple. Pain with movement and muscular tenderness present. No spinous process tenderness. Decreased range of motion.     Right lower leg: No edema.     Left lower leg: No edema.  Lymphadenopathy:     Cervical: No cervical adenopathy.  Skin:    General: Skin is warm.     Coloration: Skin is not jaundiced or pale.     Findings: No bruising, erythema, lesion or rash.  Neurological:     General: No focal deficit present.     Mental Status: He is alert and oriented to person, place, and time.     Cranial Nerves: No cranial nerve deficit.     Sensory: No sensory deficit.     Motor: No weakness.     Coordination: Coordination normal.     Gait: Gait normal.     Deep Tendon Reflexes: Reflexes normal.  Psychiatric:        Mood and Affect: Mood normal.        Behavior: Behavior normal.        Thought Content: Thought content normal.        Judgment: Judgment normal.    Assessment & Plan:  Routine general medical examination at a health care facility  Dyslipidemia Physical exam today is normal other than the patient's pain.  His blood pressure is excellent.  His LDL is slightly high and his HDL remains low.  His triglycerides are slightly high.  I recommended the patient add fish oil 2000 mg daily.   I recommended aerobic exercise 30 to 60 minutes a day 5 days a week if his back will allow.  Recommended low impact activity such as a stationary bike or rowing machine.  Consider a coronary artery calcium score next year.  Offered the patient a flu shot which he politely declined.   Recommended a low carbohydrate diet and reduction in his consumption of soda and sugar

## 2023-11-23 NOTE — Progress Notes (Unsigned)
 [facility] [address] [place] [address] Phone: [phone] Subjective:   Rick Maldonado, am serving as a scribe for Dr. Arthea Claudene.  I'm seeing this patient by the request  of:  [name], MD  CC: Back pain  YEP:Dlagzrupcz  Rick Maldonado is a 38 y.o. male coming in with complaint of upper and lower back pain radiating distally. Patient states that upper back pain occurring for past year. Tension in neck and scapula. Tingling in fingers intermittently. Works from home. Has ergonomic set up.   Past medical history significant for an L5-S1 lumbar fusion in 2021. Pain in lumbar spine that radiates into both legs. Pain improved after surgery but never went away.     Past Medical History:  Diagnosis Date   Acid reflux    DDD (degenerative disc disease), lumbosacral    bilateral neural foraminal stenosis at L5-S1, mass effect on B exiting L5 nerve roots (2/20)   Dyslipidemia    Sleep apnea    cpap   Spondylolysis    Past Surgical History:  Procedure Laterality Date   ABDOMINAL EXPOSURE N/A 03/13/2019   Procedure: ABDOMINAL EXPOSURE;  Surgeon: Gretta Lonni PARAS, MD;  Location: [facility] OR;  Service: Vascular;  Laterality: N/A;   ANTERIOR LUMBAR FUSION N/A 03/13/2019   Procedure: Lumbar five Sacral one Anterior lumbar interbody fusion with percutaneous posterior instrumentation;  Surgeon: Alix Charleston, MD;  Location: [facility] OR;  Service: Neurosurgery;  Laterality: N/A;   APPLICATION OF ROBOTIC ASSISTANCE FOR SPINAL PROCEDURE N/A 03/13/2019   Procedure: APPLICATION OF ROBOTIC ASSISTANCE FOR SPINAL PROCEDURE;  Surgeon: Alix Charleston, MD;  Location: [facility] OR;  Service: Neurosurgery;  Laterality: N/A;   LUMBAR PERCUTANEOUS PEDICLE SCREW 1 LEVEL N/A 03/13/2019   Procedure: LUMBAR PERCUTANEOUS PEDICLE SCREW LUMBAR FIVE-SACRAL ONE LEVEL;  Surgeon: Alix Charleston, MD;  Location[facility] OR;  Service: Neurosurgery;  Laterality: N/A;   TONSILLECTOMY      as a child   Social History   Socioeconomic History   Marital status: Married    Spouse name: Rick Maldonado   Number of children: 0   Years of education: BS   Highest education level: Not on file  Occupational History   Occupation: Nature conservation officer    Comment: [employer]  Tobacco Use   Smoking status: Never   Smokeless tobacco: Never  Substance and Sexual Activity   Alcohol use: Yes    Comment: rarely a beer   Drug use: No   Sexual activity: Not on file  Other Topics Concern   Not on file  Social History Narrative   Patient consumes 2 glasses of caffeine daily,is right handed   Social Drivers of Corporate investment banker Strain: Not on file  Food Insecurity: Not on file  Transportation Needs: Not on file  Physical Activity: Not on file  Stress: Not on file  Social Connections: Not on file   Allergies  Allergen Reactions   Morphine And Codeine Anaphylaxis    03/13/19 Per pt, he was told that he had throat closing to morphine when his tonsils were removed as a child. Has tolerated oxycodone and hydromorphone  for various procedures as an adult    Codeine Other (See Comments)    Reaction unknown   Penicillins Other (See Comments)    Reaction unknown Did it involve swelling of the face/tongue/throat, SOB, or low BP? Unknown Did it involve sudden or severe rash/hives, skin peeling, or any reaction on the inside of  your mouth or nose? Unknown Did you need to seek medical attention at a hospital or doctor's office? Unknown When did it last happen? Informed as a child that he was allergic  If all above answers are NO, may proceed with cephalosporin use.    Sulfa Antibiotics Other (See Comments)    Reaction unknown   Family History  Problem Relation Age of Onset   Other Mother        drug addiction   Arthritis Father    Sleep apnea Father    No current outpatient medications on file.   Reviewed prior external information including notes and imaging from  primary  care provider As well as notes that were available from care everywhere and other healthcare systems.  Past medical history, social, surgical and family history all reviewed in electronic medical record.  No pertanent information unless stated regarding to the chief complaint.   Review of Systems:  No headache, visual changes, nausea, vomiting, diarrhea, constipation, dizziness, abdominal pain, skin rash, fevers, chills, night sweats, weight loss, swollen lymph nodes, body aches, joint swelling, chest pain, shortness of breath, mood changes. POSITIVE muscle aches  Objective  There were no vitals taken for this visit.   General: No apparent distress alert and oriented x3 mood and affect normal, dressed appropriately.  HEENT: Pupils equal, extraocular movements intact  Respiratory: Patient's speak in full sentences and does not appear short of breath  Cardiovascular: No lower extremity edema, non tender, no erythema  back exam shows patient does have some mild loss of lordosis.  Significant tightness noted on the left side of the neck and the left parascapular area.  Patient does have some limited sidebending to the left of the neck with rotation to the right.  Neurovascularly intact distally in the hands.  Deep tendon reflexes are intact.  [id number]; 15 additional minutes spent for Therapeutic exercises as stated in above notes.  This included exercises focusing on stretching, strengthening, with significant focus on eccentric aspects.   Long term goals include an improvement in range of motion, strength, endurance as well as avoiding reinjury. Patient's frequency would include in 1-2 times a day, 3-5 times a week for a duration of 6-12 weeks. Shoulder Exercises that included:  Basic scapular stabilization to include adduction and depression of scapula Scaption, focusing on proper movement and good control Internal and External rotation utilizing a theraband, with elbow tucked at side entire  time Rows with theraband    Proper technique shown and discussed handout in great detail with ATC.  All questions were discussed and answered.      Impression and Recommendations:     The above documentation has been reviewed and is accurate and complete Rick Cartlidge M Jorian [place], DO

## 2023-11-24 ENCOUNTER — Ambulatory Visit: Admitting: Family Medicine

## 2023-11-24 ENCOUNTER — Ambulatory Visit

## 2023-11-24 ENCOUNTER — Telehealth: Payer: Self-pay | Admitting: Family Medicine

## 2023-11-24 ENCOUNTER — Encounter: Payer: Self-pay | Admitting: Family Medicine

## 2023-11-24 ENCOUNTER — Other Ambulatory Visit: Payer: Self-pay

## 2023-11-24 VITALS — BP 122/80 | HR 55 | Ht 68.0 in | Wt 205.0 lb

## 2023-11-24 DIAGNOSIS — M542 Cervicalgia: Secondary | ICD-10-CM

## 2023-11-24 DIAGNOSIS — M545 Low back pain, unspecified: Secondary | ICD-10-CM

## 2023-11-24 DIAGNOSIS — G2589 Other specified extrapyramidal and movement disorders: Secondary | ICD-10-CM

## 2023-11-24 DIAGNOSIS — R5383 Other fatigue: Secondary | ICD-10-CM

## 2023-11-24 LAB — COMPREHENSIVE METABOLIC PANEL WITH GFR
ALT: 30 U/L (ref 0–53)
AST: 23 U/L (ref 0–37)
Albumin: 5 g/dL (ref 3.5–5.2)
Alkaline Phosphatase: 50 U/L (ref 39–117)
BUN: 16 mg/dL (ref 6–23)
CO2: 29 meq/L (ref 19–32)
Calcium: 9.9 mg/dL (ref 8.4–10.5)
Chloride: 103 meq/L (ref 96–112)
Creatinine, Ser: 1.18 mg/dL (ref 0.40–1.50)
GFR: 77.74 mL/min (ref >60.00)
Glucose, Bld: 86 mg/dL (ref 70–99)
Potassium: 4.1 meq/L (ref 3.5–5.1)
Sodium: 140 meq/L (ref 135–145)
Total Bilirubin: 1.1 mg/dL (ref 0.2–1.2)
Total Protein: 7.2 g/dL (ref 6.0–8.3)

## 2023-11-24 LAB — IBC PANEL
Iron: 120 ug/dL (ref 42–165)
Saturation Ratios: 30.1 % (ref 20.0–50.0)
TIBC: 399 ug/dL (ref 250.0–450.0)
Transferrin: 285 mg/dL (ref 212.0–360.0)

## 2023-11-24 LAB — FERRITIN: Ferritin: 246.3 ng/mL (ref 22.0–322.0)

## 2023-11-24 LAB — VITAMIN D 25 HYDROXY (VIT D DEFICIENCY, FRACTURES): VITD: 36.85 ng/mL (ref 30.00–100.00)

## 2023-11-24 LAB — URIC ACID: Uric Acid, Serum: 7.7 mg/dL (ref 4.0–7.8)

## 2023-11-24 LAB — SEDIMENTATION RATE: Sed Rate: 1 mm/h (ref 0–15)

## 2023-11-24 LAB — TESTOSTERONE: Testosterone: 313.98 ng/dL (ref 300.00–890.00)

## 2023-11-24 LAB — C-REACTIVE PROTEIN: CRP: <1 mg/dL (ref 0.5–20.0)

## 2023-11-24 LAB — VITAMIN B12: Vitamin B-12: 335 pg/mL (ref 211–911)

## 2023-11-24 LAB — TSH: TSH: 1.05 u[IU]/mL (ref 0.35–5.50)

## 2023-11-24 MED ORDER — TIZANIDINE HCL 2 MG PO TABS: 2.0000 mg | ORAL_TABLET | Freq: Every day | ORAL | 0 refills | Status: DC

## 2023-11-24 NOTE — Assessment & Plan Note (Signed)
 Scapular dyskinesis and concern the patient does have what appears to be potentially some degenerative disc disease of the cervical spine causing some radicular symptoms.  With patient having significant amount of the axial skeletal plane and I do feel that further evaluation with laboratory workup to see if anything else is contributing is worthwhile.  Discussed icing regimen, discussed ergonomics, Zanaflex  2 mg to given to take at night.  Follow-up again in 6 to 8 weeks otherwise.

## 2023-11-24 NOTE — Telephone Encounter (Signed)
 Patient called and stated that a prescription was discussed in his appt today but he does not see anything on the paperwork. He did not know if Dr. Claudene meant for after he reviewed the labs or to go ahead and start taking the medicine. Please Advise.

## 2023-11-24 NOTE — Patient Instructions (Signed)
 Xray today Labs today Exercises  See me again in 6-8 weeks

## 2023-11-25 ENCOUNTER — Ambulatory Visit: Payer: Self-pay | Admitting: Family Medicine

## 2023-12-20 ENCOUNTER — Other Ambulatory Visit: Payer: Self-pay | Admitting: Family Medicine

## 2024-01-04 NOTE — Progress Notes (Unsigned)
 [facility] [address] [place] [address] Phone: [phone] Subjective:   ISusannah Maldonado, am serving as a scribe for Dr. Arthea Claudene.  I'm seeing this patient by the request  of:  Rick Butler DASEN, MD  CC: Back and neck pain follow-up  YEP:Dlagzrupcz  11/24/2023 Scapular dyskinesis and concern the patient does have what appears to be potentially some degenerative disc disease of the cervical spine causing some radicular symptoms.  With patient having significant amount of the axial skeletal plane and I do feel that further evaluation with laboratory workup to see if anything else is contributing is worthwhile.  Discussed icing regimen, discussed ergonomics, Zanaflex  2 mg to given to take at night.  Follow-up again in 6 to 8 weeks otherwise.     Updated 01/05/2024 Rick Maldonado is a 38 y.o. male coming in with complaint of neck and back pain. Overall has been doing better. Recently had some neck pain. Pain is different than before. Hand and feet symptoms have decreased. Pain in the neck is R side that radiates a stinging pain down into clavicle area.   Laboratory workup showed significant elevation in uric acid.  B12 on the lower end of normal  Previous x-rays of the lumbar spine showed that patient does have posterior fusion at L5-S1 with a chronic pars defect at L5. Neck x-ray does show some mild anterior spurring at C4-5 and C5-6  Past Medical History:  Diagnosis Date   Acid reflux    DDD (degenerative disc disease), lumbosacral    bilateral neural foraminal stenosis at L5-S1, mass effect on B exiting L5 nerve roots (2/20)   Dyslipidemia    Sleep apnea    cpap   Spondylolysis    Past Surgical History:  Procedure Laterality Date   ABDOMINAL EXPOSURE N/A 03/13/2019   Procedure: ABDOMINAL EXPOSURE;  Surgeon: Rick Lonni PARAS, MD;  Location: [facility] OR;  Service: Vascular;  Laterality: N/A;   ANTERIOR LUMBAR FUSION N/A 03/13/2019    Procedure: Lumbar five Sacral one Anterior lumbar interbody fusion with percutaneous posterior instrumentation;  Surgeon: Rick Charleston, MD;  Location: [facility] OR;  Service: Neurosurgery;  Laterality: N/A;   APPLICATION OF ROBOTIC ASSISTANCE FOR SPINAL PROCEDURE N/A 03/13/2019   Procedure: APPLICATION OF ROBOTIC ASSISTANCE FOR SPINAL PROCEDURE;  Surgeon: Rick Charleston, MD;  Location: [facility] OR;  Service: Neurosurgery;  Laterality: N/A;   LUMBAR PERCUTANEOUS PEDICLE SCREW 1 LEVEL N/A 03/13/2019   Procedure: LUMBAR PERCUTANEOUS PEDICLE SCREW LUMBAR FIVE-SACRAL ONE LEVEL;  Surgeon: Rick Charleston, MD;  Location: [facility] OR;  Service: Neurosurgery;  Laterality: N/A;   TONSILLECTOMY     as a child   Social History   Socioeconomic History   Marital status: Married    Spouse name: Comer   Number of children: 0   Years of education: BS   Highest education level: Not on file  Occupational History   Occupation: Nature Conservation Officer    Comment: [employer]  Tobacco Use   Smoking status: Never   Smokeless tobacco: Never  Substance and Sexual Activity   Alcohol use: Yes    Comment: rarely a beer   Drug use: No   Sexual activity: Not on file  Other Topics Concern   Not on file  Social History Narrative   Patient consumes 2 glasses of caffeine daily,is right handed   Social Drivers of Corporate Investment Banker Strain: Not on file  Food Insecurity: Not on file  Transportation Needs: Not  on file  Physical Activity: Not on file  Stress: Not on file  Social Connections: Not on file   Allergies  Allergen Reactions   Morphine And Codeine Anaphylaxis    03/13/19 Per pt, he was told that he had throat closing to morphine when his tonsils were removed as a child. Has tolerated oxycodone and hydromorphone  for various procedures as an adult    Codeine Other (See Comments)    Reaction unknown   Penicillins Other (See Comments)    Reaction unknown Did it involve swelling of the face/tongue/throat, SOB,  or low BP? Unknown Did it involve sudden or severe rash/hives, skin peeling, or any reaction on the inside of your mouth or nose? Unknown Did you need to seek medical attention at a hospital or doctor's office? Unknown When did it last happen? Informed as a child that he was allergic  If all above answers are NO, may proceed with cephalosporin use.    Sulfa Antibiotics Other (See Comments)    Reaction unknown   Family History  Problem Relation Age of Onset   Other Mother        drug addiction   Arthritis Father    Sleep apnea Father          Current Outpatient Medications (Other):    tiZANidine  (ZANAFLEX ) 2 MG tablet, TAKE 1 TABLET BY MOUTH AT BEDTIME.   Reviewed prior external information including notes and imaging from  primary care provider As well as notes that were available from care everywhere and other healthcare systems.  Past medical history, social, surgical and family history all reviewed in electronic medical record.  No pertanent information unless stated regarding to the chief complaint.   Review of Systems:  No headache, visual changes, nausea, vomiting, diarrhea, constipation, dizziness, abdominal pain, skin rash, fevers, chills, night sweats, weight loss, swollen lymph nodes, body aches, joint swelling, chest pain, shortness of breath, mood changes. POSITIVE muscle aches  Objective  Blood pressure 120/78, pulse 60, height 5' 8 (1.727 m), weight 206 lb (93.4 kg), SpO2 97%.   General: No apparent distress alert and oriented x3 mood and affect normal, dressed appropriately.  HEENT: Pupils equal, extraocular movements intact  Respiratory: Patient's speak in full sentences and does not appear short of breath  Cardiovascular: No lower extremity edema, non tender, no erythema  Low back does have some loss lordosis noted.  Some limited sidebending bilaterally. Patient's neck exam does have some mild loss of lordosis noted.  Some difficulty on the right sided  rotation and sidebending.  Osteopathic findings C2 flexed rotated and side bent right C4 flexed rotated and side bent left C6 flexed rotated and side bent right T3 extended rotated and side bent right inhaled third rib L2 flexed rotated and side bent right L5 flexed rotated and side bent right Sacrum right on right     Impression and Recommendations:  DDD (degenerative disc disease), lumbosacral Attempted osteopathic manipulation today.  Does have some scapular dyskinesis and tightness in the neck but has been lifting on a more regular basis.  The muscle relaxer Zanaflex  has been helpful.  Laboratory workup did not show any significant signs or concerns to keep him from being active at this moment.  Increase activity slowly.  Discussed icing regimen.  Follow-up again in 2 to 3 months    Decision today to treat with OMT was based on Physical Exam  After verbal consent patient was treated with HVLA, ME, FPR techniques in cervical, thoracic, rib, lumbar  and sacral areas, all areas are chronic   Patient tolerated the procedure well with improvement in symptoms  Patient given exercises, stretches and lifestyle modifications  See medications in patient instructions if given  Patient will follow up in 8 weeks   The above documentation has been reviewed and is accurate and complete Miral Hoopes M Yenni Carra, DO

## 2024-01-05 ENCOUNTER — Ambulatory Visit (INDEPENDENT_AMBULATORY_CARE_PROVIDER_SITE_OTHER): Admitting: Family Medicine

## 2024-01-05 ENCOUNTER — Encounter: Payer: Self-pay | Admitting: Family Medicine

## 2024-01-05 VITALS — BP 120/78 | HR 60 | Ht 68.0 in | Wt 206.0 lb

## 2024-01-05 DIAGNOSIS — M9903 Segmental and somatic dysfunction of lumbar region: Secondary | ICD-10-CM

## 2024-01-05 DIAGNOSIS — M9901 Segmental and somatic dysfunction of cervical region: Secondary | ICD-10-CM | POA: Diagnosis not present

## 2024-01-05 DIAGNOSIS — M5137 Other intervertebral disc degeneration, lumbosacral region with discogenic back pain only: Secondary | ICD-10-CM

## 2024-01-05 DIAGNOSIS — M9904 Segmental and somatic dysfunction of sacral region: Secondary | ICD-10-CM

## 2024-01-05 DIAGNOSIS — M9902 Segmental and somatic dysfunction of thoracic region: Secondary | ICD-10-CM

## 2024-01-05 DIAGNOSIS — M9908 Segmental and somatic dysfunction of rib cage: Secondary | ICD-10-CM

## 2024-01-05 NOTE — Assessment & Plan Note (Signed)
 Attempted osteopathic manipulation today.  Does have some scapular dyskinesis and tightness in the neck but has been lifting on a more regular basis.  The muscle relaxer Zanaflex  has been helpful.  Laboratory workup did not show any significant signs or concerns to keep him from being active at this moment.  Increase activity slowly.  Discussed icing regimen.  Follow-up again in 2 to 3 months

## 2024-01-05 NOTE — Patient Instructions (Signed)
 Good to see you! Overall think you'll do terrific Breathe out with lifting Definitely get dumbbells See you again in 2-3 months

## 2024-01-18 ENCOUNTER — Other Ambulatory Visit: Payer: Self-pay | Admitting: Family Medicine

## 2024-02-15 ENCOUNTER — Telehealth: Payer: Self-pay

## 2024-02-15 ENCOUNTER — Ambulatory Visit: Payer: Self-pay

## 2024-02-15 NOTE — Telephone Encounter (Signed)
 This RN made first attempt to contact pt with no answer. A voicemail was left with call back number provided.   Copied from CRM #[id number]. Topic: Appointments - Appointment Scheduling >> Feb 14, 2024  4:10 PM Tonda B wrote: Patient/patient representative is calling to schedule an appointment. Refer to attachments for appointment information.  Pt is experiencing flu like symptoms with a fever

## 2024-02-15 NOTE — Telephone Encounter (Signed)
 Copied from CRM #[id number]. Topic: Appointments - Appointment Scheduling >> Feb 14, 2024  4:58 PM Tonda B wrote: Patient/patient representative is calling to schedule an appointment. Refer to attachments for appointment information. >> Feb 15, 2024  9:25 AM Montie POUR wrote: Rick Maldonado is feeling better today and does not need an appointment. He does not want to speak with a triage nurse. Fever broke last night and no fever today. Feeling A Lot better today.

## 2024-02-15 NOTE — Telephone Encounter (Signed)
 Documentation in patient's chart states his fever broke, no need for appointment and does not want to speak to NT.

## 2024-02-17 ENCOUNTER — Ambulatory Visit: Admitting: Family Medicine

## 2024-02-19 ENCOUNTER — Other Ambulatory Visit: Payer: Self-pay | Admitting: Family Medicine

## 2024-03-07 NOTE — Progress Notes (Unsigned)
 " [name] [address] [place] [address] Phone: [phone] Subjective:   Rick Maldonado, am serving as a scribe for Dr. Arthea Claudene.  I'm seeing this patient by the request  of:  [name], MD  CC: Back and neck pain follow-up  YEP:Dlagzrupcz  Rick Maldonado is a 39 y.o. male coming in with complaint of back and neck pain. OMT on 01/05/2024. Patient states continues to have more discomfort in the neck and shoulder region and the patient does have following the low back at this time.  Patient feels the low back has been beneficial and has been doing things such as squatting which is significantly improved from previous exam.  Medications patient has been prescribed: zanaflex   Taking: Intermittently      Previous imaging includes x-rays of the lumbar and cervical spine at the end of September 2025 showing a stable posterior fusion at L5-S1 with a pars defect noted there.  Patient also has some endplate spurring from C3-C6. Medications we have prescribed include Zanaflex  2 mg to take at night   Reviewed prior external information including notes and imaging from previsou exam, outside providers and external EMR if available.   As well as notes that were available from care everywhere and other healthcare systems.  Past medical history, social, surgical and family history all reviewed in electronic medical record.  No pertanent information unless stated regarding to the chief complaint.   Past Medical History:  Diagnosis Date   Acid reflux    DDD (degenerative disc disease), lumbosacral    bilateral neural foraminal stenosis at L5-S1, mass effect on B exiting L5 nerve roots (2/20)   Dyslipidemia    Sleep apnea    cpap   Spondylolysis     Allergies[1]   Review of Systems:  No headache, visual changes, nausea, vomiting, diarrhea, constipation, dizziness, abdominal pain, skin rash, fevers, chills, night sweats, weight loss,  swollen lymph nodes, body aches, joint swelling, chest pain, shortness of breath, mood changes. POSITIVE muscle aches  Objective  Blood pressure 114/78, pulse (!) 56, height 5' 8 (1.727 m), weight 200 lb (90.7 kg), SpO2 98%.   General: No apparent distress alert and oriented x3 mood and affect normal, dressed appropriately.  HEENT: Pupils equal, extraocular movements intact  Respiratory: Patient's speak in full sentences and does not appear short of breath  Cardiovascular: No lower extremity edema, non tender, no erythema  Gait MSK:  Back marked tightness in the parascapular area on the right than the left.  Osteopathic findings  C3 flexed rotated and side bent right C5 flexed rotated and side bent right T3 extended rotated and side bent right inhaled rib T9 extended rotated and side bent left L2 flexed rotated and side bent right Sacrum right on right       Assessment and Plan:  Spondylolisthesis, lumbar region Significant improvement at this time.  Having more neck discomfort than actually low back pain.  Discussed icing regimen and home exercises, increase activity slowly.  Follow-up again in 6 to 12 weeks.  Scapular dyskinesis Continue work on air cabin crew, continue work on which activities to do.  If continuing to have discomfort I do think that an acromioclavicular injection for diagnostic and therapeutic purposes could be beneficial.  Follow-up with me again in 6 to 12 weeks otherwise.    Nonallopathic problems  Decision today to treat with OMT was based on Physical Exam  After verbal consent patient was  treated with HVLA, ME, FPR techniques in cervical, rib, thoracic, lumbar, and sacral  areas  Patient tolerated the procedure well with improvement in symptoms  Patient given exercises, stretches and lifestyle modifications  See medications in patient instructions if given  Patient will follow up in 4-8 weeks     The above documentation has been  reviewed and is accurate and complete Rick Maldonado M Rick Lauf, DO         Note: This dictation was prepared with Dragon dictation along with smaller phrase technology. Any transcriptional errors that result from this process are unintentional.            [1]  Allergies Allergen Reactions   Morphine And Codeine Anaphylaxis    03/13/19 Per pt, he was told that he had throat closing to morphine when his tonsils were removed as a child. Has tolerated oxycodone and hydromorphone  for various procedures as an adult    Codeine Other (See Comments)    Reaction unknown   Penicillins Other (See Comments)    Reaction unknown Did it involve swelling of the face/tongue/throat, SOB, or low BP? Unknown Did it involve sudden or severe rash/hives, skin peeling, or any reaction on the inside of your mouth or nose? Unknown Did you need to seek medical attention at a hospital or doctor's office? Unknown When did it last happen? Informed as a child that he was allergic  If all above answers are NO, may proceed with cephalosporin use.    Sulfa Antibiotics Other (See Comments)    Reaction unknown   "

## 2024-03-08 ENCOUNTER — Ambulatory Visit (INDEPENDENT_AMBULATORY_CARE_PROVIDER_SITE_OTHER): Payer: Self-pay | Admitting: Family Medicine

## 2024-03-08 VITALS — BP 114/78 | HR 56 | Ht 68.0 in | Wt 200.0 lb

## 2024-03-08 DIAGNOSIS — M9901 Segmental and somatic dysfunction of cervical region: Secondary | ICD-10-CM | POA: Diagnosis not present

## 2024-03-08 DIAGNOSIS — M9903 Segmental and somatic dysfunction of lumbar region: Secondary | ICD-10-CM

## 2024-03-08 DIAGNOSIS — M4316 Spondylolisthesis, lumbar region: Secondary | ICD-10-CM

## 2024-03-08 DIAGNOSIS — M9908 Segmental and somatic dysfunction of rib cage: Secondary | ICD-10-CM | POA: Diagnosis not present

## 2024-03-08 DIAGNOSIS — M9902 Segmental and somatic dysfunction of thoracic region: Secondary | ICD-10-CM

## 2024-03-08 DIAGNOSIS — G2589 Other specified extrapyramidal and movement disorders: Secondary | ICD-10-CM | POA: Diagnosis not present

## 2024-03-08 DIAGNOSIS — M9904 Segmental and somatic dysfunction of sacral region: Secondary | ICD-10-CM

## 2024-03-08 NOTE — Assessment & Plan Note (Signed)
 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  If continuing to have discomfort I do think that an acromioclavicular injection for diagnostic and therapeutic purposes could be beneficial.  Follow-up with me again in 6 to 12 weeks otherwise.

## 2024-03-08 NOTE — Assessment & Plan Note (Signed)
 Significant improvement at this time.  Having more neck discomfort than actually low back pain.  Discussed icing regimen and home exercises, increase activity slowly.  Follow-up again in 6 to 12 weeks.

## 2024-03-08 NOTE — Patient Instructions (Signed)
 Keep hands in peripheral vision Add pull over and J rows See you again in 3 months

## 2024-03-29 ENCOUNTER — Other Ambulatory Visit: Payer: Self-pay | Admitting: Family Medicine

## 2024-06-06 ENCOUNTER — Ambulatory Visit: Admitting: Family Medicine

## 2024-10-17 ENCOUNTER — Ambulatory Visit: Admitting: Adult Health

## 2024-11-17 ENCOUNTER — Other Ambulatory Visit

## 2024-11-20 ENCOUNTER — Encounter: Admitting: Family Medicine
